# Patient Record
Sex: Female | Born: 1935 | Race: White | Hispanic: No | Smoking: Never smoker
Health system: Southern US, Community
[De-identification: ages and names within clinical notes are randomized; demographics above are authoritative.]

## PROBLEM LIST (undated history)

## (undated) ENCOUNTER — Emergency Department (HOSPITAL_BASED_OUTPATIENT_CLINIC_OR_DEPARTMENT_OTHER): Discharge status: Against Medical Advice | Payer: Medicare Other

## (undated) DIAGNOSIS — F0390 Unspecified dementia without behavioral disturbance: Secondary | ICD-10-CM

## (undated) DIAGNOSIS — R011 Cardiac murmur, unspecified: Secondary | ICD-10-CM

## (undated) DIAGNOSIS — K5909 Other constipation: Secondary | ICD-10-CM

## (undated) DIAGNOSIS — K5792 Diverticulitis of intestine, part unspecified, without perforation or abscess without bleeding: Secondary | ICD-10-CM

## (undated) DIAGNOSIS — R110 Nausea: Secondary | ICD-10-CM

## (undated) DIAGNOSIS — R2681 Unsteadiness on feet: Secondary | ICD-10-CM

## (undated) DIAGNOSIS — A0472 Enterocolitis due to Clostridium difficile, not specified as recurrent: Secondary | ICD-10-CM

## (undated) DIAGNOSIS — I10 Essential (primary) hypertension: Secondary | ICD-10-CM

## (undated) DIAGNOSIS — K5790 Diverticulosis of intestine, part unspecified, without perforation or abscess without bleeding: Secondary | ICD-10-CM

## (undated) DIAGNOSIS — N390 Urinary tract infection, site not specified: Secondary | ICD-10-CM

## (undated) HISTORY — DX: Nausea: R11.0

## (undated) HISTORY — DX: Diverticulitis of intestine, part unspecified, without perforation or abscess without bleeding: K57.92

## (undated) HISTORY — DX: Other constipation: K59.09

## (undated) HISTORY — DX: Urinary tract infection, site not specified: N39.0

## (undated) HISTORY — DX: Cardiac murmur, unspecified: R01.1

## (undated) HISTORY — DX: Enterocolitis due to Clostridium difficile, not specified as recurrent: A04.72

## (undated) HISTORY — DX: Diverticulosis of intestine, part unspecified, without perforation or abscess without bleeding: K57.90

## (undated) HISTORY — PX: OTHER SURGICAL HISTORY: SHX169

## (undated) HISTORY — DX: Essential (primary) hypertension: I10

## (undated) HISTORY — DX: Unspecified dementia, unspecified severity, without behavioral disturbance, psychotic disturbance, mood disturbance, and anxiety: F03.90

---

## 2002-10-01 HISTORY — PX: OTHER SURGICAL HISTORY: SHX169

## 2003-09-24 ENCOUNTER — Inpatient Hospital Stay (HOSPITAL_COMMUNITY): Admission: EM | Admit: 2003-09-24 | Discharge: 2003-10-04 | Payer: Self-pay | Admitting: Emergency Medicine

## 2003-09-26 ENCOUNTER — Encounter: Payer: Self-pay | Admitting: Orthopedic Surgery

## 2003-10-04 ENCOUNTER — Inpatient Hospital Stay: Admission: AD | Admit: 2003-10-04 | Discharge: 2003-10-15 | Payer: Self-pay | Admitting: Family Medicine

## 2003-11-08 ENCOUNTER — Ambulatory Visit (HOSPITAL_COMMUNITY): Admission: RE | Admit: 2003-11-08 | Discharge: 2003-11-08 | Payer: Self-pay | Admitting: Family Medicine

## 2003-11-12 ENCOUNTER — Inpatient Hospital Stay (HOSPITAL_COMMUNITY): Admission: AD | Admit: 2003-11-12 | Discharge: 2003-11-15 | Payer: Self-pay | Admitting: Family Medicine

## 2003-11-18 ENCOUNTER — Inpatient Hospital Stay (HOSPITAL_COMMUNITY): Admission: AD | Admit: 2003-11-18 | Discharge: 2003-11-26 | Payer: Self-pay | Admitting: Family Medicine

## 2004-01-08 ENCOUNTER — Inpatient Hospital Stay (HOSPITAL_COMMUNITY): Admission: EM | Admit: 2004-01-08 | Discharge: 2004-01-14 | Payer: Self-pay | Admitting: Emergency Medicine

## 2004-03-28 ENCOUNTER — Inpatient Hospital Stay (HOSPITAL_COMMUNITY): Admission: EM | Admit: 2004-03-28 | Discharge: 2004-03-31 | Payer: Self-pay | Admitting: Emergency Medicine

## 2004-08-07 ENCOUNTER — Ambulatory Visit: Payer: Self-pay | Admitting: Internal Medicine

## 2004-09-19 ENCOUNTER — Ambulatory Visit (HOSPITAL_COMMUNITY): Admission: RE | Admit: 2004-09-19 | Discharge: 2004-09-19 | Payer: Self-pay | Admitting: Ophthalmology

## 2004-09-27 ENCOUNTER — Ambulatory Visit: Payer: Self-pay | Admitting: Orthopedic Surgery

## 2004-10-29 ENCOUNTER — Ambulatory Visit: Payer: Self-pay | Admitting: Internal Medicine

## 2004-10-29 ENCOUNTER — Inpatient Hospital Stay (HOSPITAL_COMMUNITY): Admission: EM | Admit: 2004-10-29 | Discharge: 2004-11-02 | Payer: Self-pay | Admitting: Emergency Medicine

## 2004-10-30 HISTORY — PX: COLONOSCOPY: SHX174

## 2004-12-04 ENCOUNTER — Ambulatory Visit: Payer: Self-pay | Admitting: Internal Medicine

## 2005-01-16 ENCOUNTER — Ambulatory Visit: Payer: Self-pay | Admitting: Internal Medicine

## 2005-04-23 ENCOUNTER — Ambulatory Visit: Payer: Self-pay | Admitting: Internal Medicine

## 2005-08-01 ENCOUNTER — Ambulatory Visit: Payer: Self-pay | Admitting: Internal Medicine

## 2005-12-10 ENCOUNTER — Ambulatory Visit: Payer: Self-pay | Admitting: Orthopedic Surgery

## 2006-01-30 ENCOUNTER — Ambulatory Visit: Payer: Self-pay | Admitting: Internal Medicine

## 2006-12-18 ENCOUNTER — Ambulatory Visit: Payer: Self-pay | Admitting: Orthopedic Surgery

## 2007-12-22 ENCOUNTER — Ambulatory Visit: Payer: Self-pay | Admitting: Orthopedic Surgery

## 2008-11-03 ENCOUNTER — Ambulatory Visit: Payer: Self-pay | Admitting: Orthopedic Surgery

## 2008-12-31 ENCOUNTER — Emergency Department (HOSPITAL_COMMUNITY): Admission: EM | Admit: 2008-12-31 | Discharge: 2008-12-31 | Payer: Self-pay | Admitting: Emergency Medicine

## 2009-09-28 ENCOUNTER — Ambulatory Visit: Payer: Self-pay | Admitting: Orthopedic Surgery

## 2009-09-28 ENCOUNTER — Telehealth: Payer: Self-pay | Admitting: Orthopedic Surgery

## 2009-09-29 ENCOUNTER — Encounter: Payer: Self-pay | Admitting: Orthopedic Surgery

## 2009-09-29 ENCOUNTER — Ambulatory Visit (HOSPITAL_COMMUNITY): Admission: RE | Admit: 2009-09-29 | Discharge: 2009-09-29 | Payer: Self-pay | Admitting: Orthopedic Surgery

## 2009-10-04 ENCOUNTER — Ambulatory Visit: Payer: Self-pay | Admitting: Orthopedic Surgery

## 2009-11-30 ENCOUNTER — Ambulatory Visit: Payer: Self-pay | Admitting: Orthopedic Surgery

## 2010-09-27 ENCOUNTER — Ambulatory Visit
Admission: RE | Admit: 2010-09-27 | Discharge: 2010-09-27 | Payer: Self-pay | Source: Home / Self Care | Attending: Internal Medicine | Admitting: Internal Medicine

## 2010-09-27 LAB — BASIC METABOLIC PANEL
Glucose: 120 mg/dL
Potassium: 4.1 mmol/L (ref 3.4–5.3)

## 2010-09-27 LAB — CBC AND DIFFERENTIAL
Hemoglobin: 13.5 g/dL (ref 12.0–16.0)
Platelets: 194 10*3/uL (ref 150–399)

## 2010-10-21 ENCOUNTER — Encounter: Payer: Self-pay | Admitting: Family Medicine

## 2010-10-31 NOTE — Assessment & Plan Note (Signed)
Summary: mri results from [facility]   Visit Type:  Follow-up  CC:  back pain.  History of Present Illness: I saw [name] in the office today for a followup visit.  She is a 77 years old woman with the complaint of:  back and hip pain.  HY:[id number].  Status post RIGHT bipolar replacement did well for approximately a year and a half or 2 and then started having RIGHT hip and thigh pain.    MEDS: Tylenol arthrtis.  Complaints:  stiffness and soreness with standing in the thigh.  Today, scheduled for:  MRI results.  IMPRESSION: Multilevel spondylosis as described.  Significant right-sided neural encroachment is most prominent at L2-3 and L3-4.  See comments above.   Read By:  Elsie Stain,  M.D.  MRI report and films are reviewed. There is a neural encroachment on the RIGHT side between L2-L3 and L3-L4.  After discussion the patient wished to continue with conservative management and avoid epidurals. If possible if she gets worse, we can always do that.    Allergies: No Known Drug Allergies   Impression & Recommendations:  Problem # 1:  H N P-LUMBAR (ICD-722.10) Assessment Comment Only  The MRI was done at [facility] and it was reviewed with the report   Orders: Est. Patient Level II (52841)  Other Orders: Misc. Referral (Misc. Ref)  Patient Instructions: 1)  Call if worse

## 2010-10-31 NOTE — Letter (Signed)
Summary: Medication list  Medication list   Imported By: Jacklynn Ganong 10/07/2009 10:45:38  _____________________________________________________________________  External Attachment:    Type:   Image     Comment:   External Document

## 2010-10-31 NOTE — Assessment & Plan Note (Signed)
Summary: rt shoulder pain needs xr/medicare/mutual/bsf   Visit Type:  followup  CC:  right shoulder pain.Marland Kitchen  History of Present Illness: 77 year old female with previous history of shoulder pain requested bilateral injections RIGHT shoulder seems to be worse than LEFT  No previous trauma  Pain is moderate to severe associated with difficulty with forward elevation.  Pain present for approximately 2-3 weeks previous injections did well  Review of systems previous history of hip fracture doing well.  Examination well-developed well-nourished thin framed female awake alert in order to remove neck that is normal  Patient ambulating with a cane  Inspection of the RIGHT and LEFT shoulder reveals no swelling there is some mild tenderness in the anterolateral deltoid area.  Motor exam shows mild weakness of supraspinatus tendon internal and external rotators are normal just painful for elevation of the RIGHT shoulder 110 full range of motion LEFT shoulder.  Impingement sign 4+ out of 4 on the RIGHT 1+ out of 4 on the LEFT both shoulders remained stable   Xrays today.  Allergies: No Known Drug Allergies  Review of Systems Constitutional:  Denies weight loss, weight gain, fever, chills, and fatigue. Neurologic:  Denies numbness, tingling, unsteady gait, dizziness, tremors, and seizure. Musculoskeletal:  Denies instability.   Impression & Recommendations:  Problem # 1:  SHOULDER PAIN (ICD-719.41) Assessment Deteriorated  Orders: Est. Patient Level IV (78469) Joint Aspirate / Injection, Large (20610) Depo- Medrol 40mg  (J1030) Shoulder x-ray,  minimum 2 views (62952)  Problem # 2:  IMPINGEMENT SYNDROME (ICD-726.2) Assessment: Deteriorated  bilateral shoulder injections.shinj RIGHT shoulder,Verbal consent obtained/The shoulder was injected with depomedrol 40mg /cc and sensorcaine .25% . There were no complications  LEFT shoulder,Verbal consent obtained/The shoulder was injected  with depomedrol 40mg /cc and sensorcaine .25% . There were no complications  Orders: Est. Patient Level IV (84132) Joint Aspirate / Injection, Large (20610) Depo- Medrol 40mg  (J1030) Shoulder x-ray,  minimum 2 views (44010)

## 2011-01-10 LAB — POCT I-STAT, CHEM 8
BUN: 8 mg/dL (ref 6–23)
Calcium, Ion: 1.11 mmol/L — ABNORMAL LOW (ref 1.12–1.32)
Chloride: 94 mEq/L — ABNORMAL LOW (ref 96–112)
Creatinine, Ser: 0.8 mg/dL (ref 0.4–1.2)
Glucose, Bld: 123 mg/dL — ABNORMAL HIGH (ref 70–99)
HCT: 37 % (ref 36.0–46.0)
Hemoglobin: 12.6 g/dL (ref 12.0–15.0)
Potassium: 3.3 mEq/L — ABNORMAL LOW (ref 3.5–5.1)
Sodium: 131 mEq/L — ABNORMAL LOW (ref 135–145)
TCO2: 24 mmol/L (ref 0–100)

## 2011-01-23 ENCOUNTER — Ambulatory Visit (INDEPENDENT_AMBULATORY_CARE_PROVIDER_SITE_OTHER): Payer: Medicare Other | Admitting: Internal Medicine

## 2011-01-23 DIAGNOSIS — K5900 Constipation, unspecified: Secondary | ICD-10-CM

## 2011-02-16 NOTE — Group Therapy Note (Signed)
[name], [name]               ACCOUNT NO.:  [id number]   MEDICAL RECORD NO.:  [id number]          PATIENT TYPE:  INP   LOCATION:  A323                          FACILITY:  [facility]   PHYSICIAN:  Angus G. Renard Matter, MD   DATE OF BIRTH:  10-14-1925   DATE OF PROCEDURE:  DATE OF DISCHARGE:                                   PROGRESS NOTE   SUBJECTIVE:  This patient had a flare up of clostridium difficile.  She had  colonoscopy which showed patchy change of colitis with white areas which  were thought to be resolving pseudomembranes.  The patient was placed on  Vancomycin 250 mg q.i.d.   OBJECTIVE:  VITAL SIGNS:  Blood pressure 128/69, respirations 20, pulse 78,  temperature 97.2.  LUNGS:  Clear to P&A.  HEART:  Regular rhythm.  ABDOMEN:  Minimal tenderness over lower abdomen.   ASSESSMENT:  The patient is thought to have flare of clostridium difficile  colitis.   PLAN:  Continue therapy.  Continue IV fluids.      AGM/MEDQ  D:  11/01/2004  T:  11/01/2004  Job:  [id number]

## 2011-02-16 NOTE — Group Therapy Note (Signed)
[name], [name]                         ACCOUNT NO.:  [id number]   MEDICAL RECORD NO.:  [id number]                  PATIENT TYPE:  PINP   LOCATION:  318                                  FACILITY:   PHYSICIAN:  Angus G. Renard Matter, M.D.              DATE OF BIRTH:  02/29/1998   DATE OF PROCEDURE:  03/30/2004  DATE OF DISCHARGE:                                   PROGRESS NOTE   SUBJECTIVE:  This patient continues to run some fever.  Continues to have  diarrhea but decreased amount.  She is being treated for C.difficile  colitis.   OBJECTIVE:  VITAL SIGNS:  Blood pressure 101/44, respirations 18, pulse 87,  temperature 100.3.  LUNGS:  Clear to P&A.  HEART:  Regular rhythm.  ABDOMEN:  No palpable organs or masses.   ASSESSMENT:  The patient was admitted with diarrhea and dehydration,  possible recurrence of C.difficile colitis.   PLAN:  Continue current regimen.  The patient did have stool positive for  C.difficile.      ___________________________________________                                            Ishmael Holter. Renard Matter, M.D.   AGM/MEDQ  D:  03/30/2004  T:  03/30/2004  Job:  [id number]

## 2011-02-16 NOTE — Discharge Summary (Signed)
NAME:  [name], [name]                         ACCOUNT NO.:  [id number]   MEDICAL RECORD NO.:  [id number]                   PATIENT TYPE:  INP   LOCATION:  A318                                 FACILITY:  [facility]   PHYSICIAN:  Angus G. Renard Matter, M.D.              DATE OF BIRTH:  27-Aug-1926   DATE OF ADMISSION:  03/28/2004  DATE OF DISCHARGE:  03/31/2004                                 DISCHARGE SUMMARY   DIAGNOSES:  1. Colitis secondary to clostridium difficile.  2. Hypokalemia.  3. Dehydration.   CONDITION ON DISCHARGE:  Stable.   HISTORY OF PRESENT ILLNESS:  This 77 year old white female presented herself  to ED with increased diarrhea which had been present over a period of  several days.  The patient has a history of C.difficile colitis, has been  treated in the past with Vancomycin.  She apparently felt extremely weak.  Lab work done in ED showed a white count of 7700 with hemoglobin 13.9 and  hematocrit 40.  Stools were ordered for C.difficile, and she was given  intravenous fluids and started on Vancomycin.   OBJECTIVE:  VITAL SIGNS:  Blood pressure 91/50, respirations 20, pulse 109,  temperature 98.7.  HEENT:  Eyes PERRLA.  TMs negative.  Oropharynx benign.  NECK:  Supple.  No JVD or thyroid abnormalities.  LUNGS:  Clear to P&A.  HEART:  Regular rhythm.  No murmurs.  ABDOMEN:  No palpable organs or masses.  SKIN:  Warm and dry.  EXTREMITIES:  Free of edema.   LABORATORY DATA:  Admission CBC:  WBC 7700, hemoglobin 13.9, hematocrit  40.0.  Chemistries:  Sodium 133, potassium 3.6, chloride 100, CO2 24,  glucose 160, BUN 25, creatinine 1.1, calcium 8.5.  Liver enzymes:  SGOT 21,  SGPT 10, alkaline phosphatase 52, bilirubin 0.9.  Urinalysis negative.  Stool for C.difficile toxin positive.   HOSPITAL COURSE:  The patient, at the time of her admission, was placed on  soft diet, IV fluids, normal saline at 125 cc per hour.  She was given p.o.  Vancomycin 250 mg q.i.d.  Was given  Zofran 4 mg IV q.8h. p.r.n. nausea,  Imodium one after each loose stool.  The patient also had stool studies for  __________ [facility].  Was started on Lactinex two capsules a.c. t.i.d.  Her  Vancomycin was increased to 500 mg q.i.d. by Dr. Jena Gauss.  The patient did  show progressive improvement.  Dr. Jena Gauss felt that she should be on  Vancomycin over extended period.  The  patient did show improvement during the hospital stay and was able to be  discharged after three days hospitalization.  She was discharged home on  Vancomycin 500 mg one q.i.d., Lactinex two capsules t.i.d., Imodium p.r.n.,     ___________________________________________  Angus G. Renard Matter, M.D.   AGM/MEDQ  D:  04/07/2004  T:  04/07/2004  Job:  [id number]

## 2011-02-16 NOTE — Discharge Summary (Signed)
NAME:  TYKEISHA, PEER                         ACCOUNT NO.:  [id number]   MEDICAL RECORD NO.:  [id number]                   PATIENT TYPE:  INP   LOCATION:  A304                                 FACILITY:  [facility]   PHYSICIAN:  Angus G. Renard Matter, M.D.              DATE OF BIRTH:  [birth year]   DATE OF ADMISSION:  11/12/2003  DATE OF DISCHARGE:  11/15/2003                                 DISCHARGE SUMMARY   PATIENT IDENTIFICATION:  A 77 year old white female admitted to the hospital  November 12, 2003; discharged November 15, 2003; 3 days hospitalization.   DIAGNOSES:  1. Gastroenteritis.  2. Hypovolemia.  3. Diverticulitis of colon.  4. History of having had total hip replacement on the right in December     2004.   CONDITION:  Stable at the time of her discharge.   A 77 year old white female was seen in the office on day of admission with  the chief complaint being episode of nausea and diarrhea over a period of  several days.  Apparently the patient had been unable to keep anything down.  She had been recently diagnosed as having diverticulitis.  Had a recent CT  of the abdomen which showed evidence of diverticulitis involving the  descending colon.  She had been placed on Cipro 500 mg b.i.d. and Flagyl 500  mg b.i.d. along with Levbid one b.i.d. for treatment.  The patient also had  on CT evidence of attenuation within the uterus raising the question of  endometrial lesion.  Further evaluation by pelvic ultrasound was felt to be  indicated.  Because of the difficulty with her keeping any food down over a  period of several days it was felt she should be in the hospital for  rehydration and further evaluation.   PHYSICAL EXAMINATION:  GENERAL:  Alert female with blood pressure 110/70,  respirations 60, pulse 18, temperature 98.  HEENT:  Eyes PERRLA, TM negative, oropharynx benign.  NECK:  Supple.  No JVD or thyroid abnormalities.  LUNGS:  Clear to P&A.  HEART:  Regular rhythm, no  murmurs.  ABDOMEN:  No palpable organs or masses.  PELVIC:  Not performed.  EXTREMITIES:  Free of edema.   LABORATORY DATA:  Admission CBC:  Wbc 5600, hemoglobin 11.5, hematocrit  33.2.  Chemistry:  Sodium 135, potassium 3.8, chloride 98, CO2 31, glucose  113, BUN 10, creatinine 0.8. Liver enzymes:  SGOT 15, SGPT 11, alkaline  phosphatase 53, total bilirubin 0.6.  Urinalysis:  7-10 wbc's.  Stool  culture negative.  Urine culture:  65,000 colonies multiple species.  Stool  for C. difficile was negative.   HOSPITAL COURSE:  The patient at the time of her admission was placed on  intravenous fluids half normal saline at 125 mL/hour.  Vital signs were  monitored q.i.d.  She was given IV Zofran 4 mg q.8h. p.r.n. for nausea.  She  was seen in consultation  by Dr. Jena Gauss and Karilyn Cota, was placed on Imodium two  initially and one after each loose stool.  Her urine was cultured.  She was  placed on Protonix 40 mg daily.  It was the feeling of Dr. Jena Gauss that this  patient had a bout of acute diverticulitis treated with Cipro and Flagyl.  He felt that the nausea, vomiting, and diarrhea might be related to the  antibiotics.  Conservative therapy was felt to be indicated.  The patient  improved quickly.  Her diet was advanced and she was able to be discharged  from the hospital after 3 days hospitalization to be followed as an  outpatient.  The patient was discharged on Protonix 40 mg daily and was  asked to return to the office for follow-up.  Condition was stable at the  time of her discharge.     ___________________________________________                                         Ishmael Holter. Renard Matter, M.D.   AGM/MEDQ  D:  12/03/2003  T:  12/03/2003  Job:  [id number]

## 2011-02-16 NOTE — Op Note (Signed)
[name], [name]               ACCOUNT NO.:  [id number]   MEDICAL RECORD NO.:  [id number]          PATIENT TYPE:  INP   LOCATION:  A323                          FACILITY:  [facility]   PHYSICIAN:  Lionel December, M.D.    DATE OF BIRTH:  August 20, 1926   DATE OF PROCEDURE:  10/30/2004  DATE OF DISCHARGE:                                 OPERATIVE REPORT   PROCEDURE:  Total colonoscopy.   ENDOSCOPIST:  Lionel December, M.D.   INDICATIONS FOR PROCEDURE:  The patient is a 77 year old Caucasian female  who presents with recurrent diarrhea, some bleeding and mucus per rectum.  She was diagnosed with C. difficile colitis one year ago, and has undergone  multiple sessions of therapy with Flagyl and vancomycin.  Her symptoms  relapsed about three weeks ago.  The C. difficile toxin titers have been  negative x3.  She improved with Flagyl but now the symptoms are relapsing as  the therapy is being tapered.  She has a thickened cecum in the right colon  and possibly sigmoid colon on CT.  She is undergoing a diagnostic  colonoscopy to make sure she does not have another form of colitis, rather  than C. difficile.   INFORMED CONSENT:  An informed consent was obtained.   PREOPERATIVE MEDICATIONS:  Demerol 25 mg IV, Versed 3 mg IV in divided dose.   FINDINGS:  The procedure was performed in the endoscopy suite.  The  patient's vital signs and O2 saturation were monitored during the procedure  and remained stable.  The patient was placed in the left lateral position.  A rectal examination was performed.  The rectal tone was somewhat  diminished.  No other abnormality was noted.  The Olympus video scope was  placed in the rectum and advanced under direct vision into the sigmoid  colon.  The preparation was excellent.  She had punctate whitish areas  scattered throughout the colon, surrounded by erythematous mucosa.  Although  all segments of colonic mucosa were involved, but this was a patchy  involvement.  Some of the segments revealed normal mucosal pattern.  Cecal  landmarks, i.e. appendiceal orifice and ileocecal valve were well-seen.  Pictures were taken for the record.  A short segment of TI was also examined  and was normal.  As the scope was withdrawn, colonic mucosa was examined for  the second time.  There were diverticula throughout the colon, but these  were concentrated in the sigmoid colon.  Biopsies were taken from the cecum  and the sigmoid colon for routine histology.  The rectal mucosa was normal.  The scope was retroflexed and examined in the __________ junction and small  hemorrhoids were noted below the dentate line.  The endoscope was  straightened and withdrawn.   The patient tolerated the procedure well.   FINAL DIAGNOSES:  1.  Patchy changes of colitis with punctate whitish areas which are      resolving pseudomembranes.  These changes extend from the rectum all the      way to the cecum.  Biopsy taken from two areas as above.  2.  Pan-colonic diverticulosis.  3.  Small external hemorrhoids.  4.  Normal TI.   RECOMMENDATIONS:  1.  Flagyl will be discontinued.  She will begin vancomycin 250 mg p.o.      q.i.d.  2.  Full liquids.  3.  Loperamide 2 mg t.i.d. for a few days.  4.  I feel that she may have to be treated for at least 12 weeks.  Will      review the literature and see if there are any other options.      NR/MEDQ  D:  10/30/2004  T:  10/30/2004  Job:  [id number]   cc:   Dr. Butch Penny

## 2011-02-16 NOTE — Consult Note (Signed)
NAME:  [name], [name]                         ACCOUNT NO.:  [id number]   MEDICAL RECORD NO.:  [id number]                   PATIENT TYPE:  INP   LOCATION:  A312                                 FACILITY:  [facility]   PHYSICIAN:  Lionel December, M.D.                 DATE OF BIRTH:  [birth year]   DATE OF CONSULTATION:  11/19/2003  DATE OF DISCHARGE:                                   CONSULTATION   REASON FOR CONSULTATION:  Nausea, vomiting, and diarrhea.   HISTORY OF PRESENT ILLNESS:  [name] is a 77 year old Caucasian female  who was admitted to Dr. Lorenso Courier service last night with intractable  nausea, vomiting, and explosive diarrhea with very foul-smelling stools.  [name] also felt weak.   [name] present illness began about 2 weeks ago when [name] developed  diverticulitis.  [name] was treated with Cipro and Flagyl.  [name] was able to  take the antibiotics for 4 days.  [name] then developed nausea and vomiting.  [name] was hospitalized on November 12, 2003.  It was felt [name] nausea and  vomiting were secondary to antibiotics which were discontinued.  [name] was  also seen in consultation by Dr. Jena Gauss who felt [name] diverticulitis had  resolved based on abdominal findings.  Review of the records reveals [name] did  have C. difficile during that hospitalization which was negative.  [name] went  home on November 15, 2003.  That night [name] developed nausea and vomiting.  [name] was also having diarrhea.  [name] saw Dr. Renard Matter the following day.  [name]  was given antidiarrheal and Phenergan.  [name] had stool studies and a C.  difficile positive.  Dr. Renard Matter conferred with me over the phone yesterday  afternoon and felt that [name] was intolerant of Flagyl and I recommended using  vancomycin rather than going with newly available agent, i.e. Alinia.  However, [name] was not able to keep the first dose of vancomycin down.  According to the daughter [name] had explosive diarrhea last night.  [name] could  not even make it to the  bathroom.  [name] was weak and lightheaded and  therefore brought to the emergency room.  Along with nausea and vomiting [name]  felt very weak.  [name] was therefore hospitalized.  Today [name] has been able to  keep clear liquids although [name] has been taking sips. [name] is also able to  keep [name] vancomycin down.  [name] is still passing loose, watery stools.  [name]  tells me the last stool was dark black.  [name] has not had any hematemesis or  rectal bleeding.  [name] has slight soreness in left low quadrant, nothing like  [name] had last week when [name] was treated for diverticulitis.  [name] usual  medications at home include Lumigan and [name] was also on Phenergan, Imodium,  and vancomycin was started yesterday.  [name] was also on Protonix 40 mg daily.   PAST MEDICAL  HISTORY:  [name] has history of diverticulosis.  [name] had a  colonoscopy 4-5 years ago at this facility.  [name] has had diverticulitis.  [name] had right hip replacement in December 2004.  Following this surgery [name]  had problems with constipation but this was finally treated.  [name] has  bilateral glaucoma, history of recurrent urinary tract infections, and [name]  had bilateral eyelid surgery for ptosis.  Now [name] is unable to close [name]  eyes when [name] is sleeping at night.   ALLERGIES:  None known.   SOCIAL HISTORY:  [name] is retired.  [name] had been divorced for many years.  [name]  has three children.  One lives in [place], another one lives 20 minutes  from here.  [name] does not smoke cigarettes or drink alcohol.  [name] is retired.   FAMILY HISTORY:  Three siblings are living.  Five are deceased.  One was  diabetic.   PHYSICAL EXAMINATION:  GENERAL:  Pleasant, well-developed, well-nourished  Caucasian female who is in no acute distress  .VITAL SIGNS:  [name] weighs 125.1 pounds, [name] is 5 feet 6 inches tall.  Pulse  92 per minute, blood pressure 113/54, respiratory rate is 22, temperature  98.7.  Temperature earlier today was 101.1.  HEENT:  Conjunctivae are pink, sclerae  nonicteric.  Oropharyngeal mucosa is  normal.  Dentition in satisfactory condition.  NECK:  Without masses or thyromegaly.  CARDIAC:  Regular rhythm, normal S1 and S2.  No murmur or gallop noted.  LUNGS:  Clear to auscultation.  ABDOMEN:  [name] abdomen is protuberant.  Bowel sounds are hyperactive.  Palpation reveals soft abdomen with mild tenderness at LLQ and slight  tenderness at RLQ.  No organomegaly or masses noted.  RECTAL:  Deferred.  [name] has some edema to [name] left forearm where [name] had IV  infiltration.  [name] does not have peripheral edema.   LABORATORY DATA:  From this morning:  WBC 7.9, H&H 10.6 and 30.6, platelet  count 261.  [name] had 81 neutrophils.  Sodium 134, potassium 3.2, chloride 97,  CO2 29, glucose 122, BUN 10, creatinine 1.0, bilirubin 0.9, AP is 44, AST  13, ALT 11, total protein 4.4, albumin is 2.2, calcium 7.8.   Urinalysis had 7-10 wbc's per high-power field.   ASSESSMENT:  [name] is a 77 year old Caucasian female whose nausea,  vomiting, and explosive diarrhea appears to be due to Clostridium difficile  colitis.  [name] C. difficile toxin titer was positive at Dr. Lorenso Courier office.  This would appear to be related to antibiotic use that [name] had for  diverticulitis.  [name] is intolerant of metronidazole.  Therefore, options  would be vancomycin or Alinia.  I would rather go with vancomycin which has  been well tested and at least [name] has been able to keep a few doses down.   Anemia.  [name] hemoglobin has dropped by 1 g in the last 1 week.  This is felt  to be due to acute illness.  Stool will be guaiaced to make sure [name] is not  losing blood via [name] GI tract.   RECOMMENDATIONS:  1. Leave [name] on vancomycin at 250 mg p.o. q.i.d.  I have taken the liberty     of changing [name] loperamide to 2 mg t.i.d. x6 doses.  Diet will be     advanced to full liquids and yogurt will be provided with each meal.  2. Hemoccult x1. 3. I will resume [name] Protonix at 40 mg daily.   I  would like to thank Dr. Renard Matter for the opportunity to participate in the  care of this nice lady.      ___________________________________________                                            Lionel December, M.D.   NR/MEDQ  D:  11/19/2003  T:  11/20/2003  Job:  [id number]

## 2011-02-16 NOTE — H&P (Signed)
[name], [name]                          ACCOUNT NO.:  [id number]   MEDICAL RECORD NO.:  [id number]                  PATIENT TYPE:   LOCATION:                                       FACILITY:   PHYSICIAN:  Angus G. McInnis, M.D.              DATE OF BIRTH:   DATE OF ADMISSION:  DATE OF DISCHARGE:                                HISTORY & PHYSICAL   HISTORY:  This 77 year old white female was seen in the office on the day of  admission with the chief complaint being episodes of nausea and diarrhea  over a period of several days.  Apparently the patient had been unable to  keep anything down. She had been recently diagnosed as having  diverticulitis. She had a recent CT of the abdomen which showed evidence of  diverticulitis involving the descending colon.  She had been placed on Cipro  500 mg b.i.d. and Flagyl 500 mg b.i.d. along with Levbid 1 b.i.d. for  treatment.  The patient also had, on the CT, evidence of low attenuation  within the uterus raising the question of endometrial lesion.  A further  evaluation by pelvic ultrasound was felt to be indicated.  Because of the  difficulty with keeping any food down, over a period of several days; it was  felt that the patient needed to be admitted for rehydration.   LABORATORY:  CBC:  WBC 5600, hemoglobin 11.5, hematocrit 33.2.  Chemistry:  Sodium 135, potassium 3.8, chloride 98, CO2 31, glucose 113, BUN 10,  creatinine 0.8, alkaline phosphatase 0.6, SGOT 15, SGPT 11, total protein  5.9.   SOCIAL HISTORY:  The patient does not smoke or drink alcohol.   PAST MEDICAL AND SURGICAL HISTORY:  The patient has history of having had  recent diagnosis of diverticulitis.  She has recently been hospitalized and  had a total hip replacement on the right in December 2004.  The patient  spent a short period of time in nursing center.   ALLERGIES:  She has no known allergies.   MEDICATIONS:  1. Cipro 500 mg b.i.d.  2. Flagyl 500 mg b.i.d.  3. Levbid 1 b.i.d.   REVIEW OF SYSTEMS:  HEENT:  Negative.  CARDIOPULMONARY:  No cough,  hemoptysis, dyspnea.  GI:  Bouts of nausea, vomiting, and diarrhea.  GU:  No  dysuria or hematuria.   PHYSICAL EXAMINATION:  VITAL SIGNS:  BP 110/70, pulse 60, respirations 18,  temperature 98.  HEENT:  Eyes PERRLA.  TMs negative.  Oropharynx benign.  NECK:  Supple.  No JVD or thyroid abnormalities.  LUNGS:  Clear to P&A.  HEART:  Regular rhythm, no murmurs.  ABDOMEN:  No palpable organs or masses.  Slight tenderness in the  midabdomen.  PELVIC:  Not performed.  EXTREMITIES:  Free of edema.   DIAGNOSES:  1. Diverticulitis.  2. Gastroenteritis.  3. Dehydration.  4. History of recent total hip replacement December 2004.  ___________________________________________                                         Ishmael Holter Renard Matter, M.D.   AGM/MEDQ  D:  11/12/2003  T:  11/12/2003  Job:  [id number]

## 2011-02-16 NOTE — Group Therapy Note (Signed)
NAME:  [name], [name]                         ACCOUNT NO.:  [id number]   MEDICAL RECORD NO.:  [id number]                   PATIENT TYPE:  INP   LOCATION:  A318                                 FACILITY:  [facility]   PHYSICIAN:  Angus G. Renard Matter, M.D.              DATE OF BIRTH:  Dec 09, 1925   DATE OF PROCEDURE:  DATE OF DISCHARGE:                                   PROGRESS NOTE   SUBJECTIVE:  This patient was admitted with witnessed profuse diarrhea and  possibly recurrence of C. difficile colitis.  She had been placed back on  p.o. vancomycin.  She continues to run a low-grade fever.   OBJECTIVE:  VITAL SIGNS:  Blood pressure 101/44, respirations 18, pulse 87,  temperature 100.3.  LUNGS:  Lungs clear to P&A.  HEART:  Regular rhythm.  ABDOMEN:  No palpable organs or masses.  Active bowel sounds.   ASSESSMENT:  Patient was readmitted with profuse diarrhea, dehydration and  possible recurrence of Clostridium difficile colitis.   PLAN:  Plan to continue current regimen.      ___________________________________________                                            Ishmael Holter. Renard Matter, M.D.   AGM/MEDQ  D:  03/29/2004  T:  03/29/2004  Job:  [id number]

## 2011-02-16 NOTE — Group Therapy Note (Signed)
NAME:  [name], [name]                         ACCOUNT NO.:  [id number]   MEDICAL RECORD NO.:  [id number]                   PATIENT TYPE:  INP   LOCATION:  A312                                 FACILITY:  [facility]   PHYSICIAN:  Angus G. Renard Matter, M.D.              DATE OF BIRTH:  Jul 04, 1926   DATE OF PROCEDURE:  DATE OF DISCHARGE:                                   PROGRESS NOTE   SUBJECTIVE:  This patient has been treated for C. difficile colitis with  vancomycin.  She continues to feel weak and has been diaphoretic.   OBJECTIVE:  Vital signs:  Blood pressure 123/66, respirations 20, pulse 81,  temperature 97.9.  Lungs:  Clear to P&A.  Heart:  Regular rhythm.  Abdomen:  No palpable organs or masses.   ASSESSMENT:  The patient has had a flare of Clostridium difficile colitis.  Was treated also for anemia and hypokalemia which has improved.   PLAN:  To continue current regimen.  Continue to advance diet.      ___________________________________________                                            Ishmael Holter. Renard Matter, M.D.   AGM/MEDQ  D:  11/25/2003  T:  11/25/2003  Job:  [id number]

## 2011-02-16 NOTE — H&P (Signed)
[name], [name]                         ACCOUNT NO.:  [id number]   MEDICAL RECORD NO.:  [id number]                   PATIENT TYPE:  INP   LOCATION:  A318                                 FACILITY:  [facility]   PHYSICIAN:  [name], M.D.              DATE OF BIRTH:  June 21, 1926   DATE OF ADMISSION:  01/08/2004  DATE OF DISCHARGE:                                HISTORY & PHYSICAL   This 77 year old white female presented herself to the emergency department  with chief complaint of weakness, nausea, vomiting, diarrhea, abdominal  cramps and abdominal pain.  This patient has a history of having been  hospitalized previously for treatment of C. difficile colitis,  diverticulitis.  She had hip replacement surgery in December, 2004, and  these problems began following this and resulted in several hospitalizations  and the long-term use of vancomycin.  Was started on IV normal saline at 200  cc/hr and patient was subsequently admitted.   SOCIAL HISTORY:  Patient does not smoke or drink.   FAMILY HISTORY:  See previous record.   PAST MEDICAL HISTORY:  The patient has a history of having had hip  replacement surgery in December of 2004.  Also has a history of having had  diverticulitis with treatment and two subsequent hospitalizations for  treatment of C. difficile colitis.   MEDICATION LIST:  Imodium, aspirin, Lactinex two t.i.d.   REVIEW OF SYSTEMS:  HEENT:  Negative.  CARDIOPULMONARY:  No cough, no chest  pain.  GI:  Bouts of vomiting and diarrhea and abdominal cramps over the  past few days.  GU:  No dysuria or hematuria.   EXAMINATION:  Alert, uncomfortable white female.  VITALS:  __________.  HEENT:  Eyes PERRLA.  TMs negative.  Oropharynx benign.  NECK:  Supple.  No JVD or thyroid abnormality.  LUNGS;  Clear to P&A.  HEART:  Regular rhythm, sinus tachycardia, no murmur.  ABDOMEN:  Slight tenderness in upper abdomen.  SKIN:  Warm and dry.  EXTREMITIES:  Free of edema.  NEUROLOGICAL:  No focal deficit.   DIAGNOSES:  1. Gastroenteritis.  2. Colitis.  3. Possible recurrent of Clostridium difficile colitis.   PERTINENT LAB DATA:  WBC 7,600, hemoglobin 12.1, hematocrit 35.6.  Chemistry:  Sodium 127, potassium 3.2, chloride 94, CO2 25, glucose 127.  BUN 11, creatinine 0.9, calcium 8.4.     ___________________________________________                                         [name]. Renard [name], M.D.   AGM/MEDQ  D:  01/08/2004  T:  01/08/2004  Job:  [id number]

## 2011-02-16 NOTE — Group Therapy Note (Signed)
NAME:  [name], AVILLA                         ACCOUNT NO.:  [id number]   MEDICAL RECORD NO.:  [id number]                   PATIENT TYPE:  INP   LOCATION:  A312                                 FACILITY:  [facility]   PHYSICIAN:  Angus G. Renard Matter, M.D.              DATE OF BIRTH:  09-02-1926   DATE OF PROCEDURE:  11/24/2003  DATE OF DISCHARGE:                                   PROGRESS NOTE   SUBJECTIVE:  This patient remains afebrile.  Her condition appears to be  improved.  She has been treated for C. difficile colitis with vancomycin.   OBJECTIVE:  Vital signs:  Blood pressure 125/63, respirations 20, pulse 89,  temperature 97.7.  Lungs:  Clear to P&A.  Heart:  Regular rhythm.  Abdomen:  No palpable organs or masses.   ASSESSMENT:  The patient has had a flare of Clostridium difficile colitis,  did develop anemia and hypokalemia which is improved.   PLAN:  Continue current regimen, advance diet.      ___________________________________________                                            Ishmael Holter. Renard Matter, M.D.   AGM/MEDQ  D:  11/24/2003  T:  11/24/2003  Job:  [id number]

## 2011-02-16 NOTE — Group Therapy Note (Signed)
NAME:  [name], [name]                         ACCOUNT NO.:  [id number]   MEDICAL RECORD NO.:  [id number]                   PATIENT TYPE:  INP   LOCATION:  A304                                 FACILITY:  [facility]   PHYSICIAN:  Edward L. Juanetta Gosling, M.D.             DATE OF BIRTH:  [birth year]   DATE OF PROCEDURE:  DATE OF DISCHARGE:                                   PROGRESS NOTE   PROBLEMS:  1. Diverticulitis.  2. Nausea.  3. Vomiting.  4. Diarrhea.   SUBJECTIVE:  [name] says she feels great today.  She has no complaints.  She is not nauseated.  She is not vomiting.  She is not bringing anything  up.   OBJECTIVE:  Her physical examination shows that she is awake and alert.  Her  temperature is 97.1, pulse 69, respirations 20, blood pressure 112/59.  Chest is pretty clear.  Heart is regular.  Abdomen is soft.  She does seem  to be doing better since she has been off of the antibiotics.  She says that  she feels a great deal better since then.   ASSESSMENT/PLAN:  I think we could go ahead with increasing her diet and  will plan to do that.  I am going to advance he diet and continue with her  other treatments.  Dr. Dan Humphreys has ordered a low residue diet, and I think we  could get her up and moving around.  No other new treatments today.      ___________________________________________                                            Oneal Deputy. Juanetta Gosling, M.D.   ELH/MEDQ  D:  11/13/2003  T:  11/13/2003  Job:  [id number]

## 2011-02-16 NOTE — Group Therapy Note (Signed)
[name], [name]                         ACCOUNT NO.:  [id number]   MEDICAL RECORD NO.:  [id number]                   PATIENT TYPE:  INP   LOCATION:  A318                                 FACILITY:  [facility]   PHYSICIAN:  Angus G. Renard Matter, M.D.              DATE OF BIRTH:  August 10, 1926   DATE OF PROCEDURE:  DATE OF DISCHARGE:                                   PROGRESS NOTE   SUBJECTIVE:  This patient was readmitted to the hospital with nausea and  multiple episodes of diarrhea.  She has a history of Clostridium difficile  colitis and diverticulitis.   OBJECTIVE:  Vital signs:  Blood pressure 96/52, respirations 18, pulse 108,  temperature 100.9.  Laboratory data, sodium 127, potassium 3.2, chloride 94,  cO2 25, BUN 11, creatinine 0.9.  Lungs clear to P&A.  Heart, regular rhythm.  Abdomen, no palpable organs or masses, slight tenderness in mid abdomen.   ASSESSMENT:  Reoccurrence of Clostridium difficile colitis, most likely.  Hypokalemia.  Mild dehydration.   PLAN:  Continue current regimen.  We will add KCl to IV fluids.      ___________________________________________                                            Ishmael Holter Renard Matter, M.D.   AGM/MEDQ  D:  01/09/2004  T:  01/09/2004  Job:  [id number]

## 2011-02-16 NOTE — Discharge Summary (Signed)
NAME:  [name], [name]                         ACCOUNT NO.:  [id number]   MEDICAL RECORD NO.:  [id number]                   PATIENT TYPE:  INP   LOCATION:  A312                                 FACILITY:  [facility]   PHYSICIAN:  Angus G. Renard Matter, M.D.              DATE OF BIRTH:  03-18-1926   DATE OF ADMISSION:  11/18/2003  DATE OF DISCHARGE:  11/26/2003                                 DISCHARGE SUMMARY   A 77 year old white female admitted November 18, 2003, discharged November 26, 2003, eight days hospitalization.   DIAGNOSES:  1. Colitis secondary to clostridium difficile.  2. Diverticulitis.  3. Hypovolemia.  4. Anemia.  5. Hypokalemia.   CONDITION:  Stable and improved at the time of discharge.   HISTORY OF PRESENT ILLNESS:  This 77 year old white female was admitted with  the chief complaint being uncontrolled vomiting and diarrhea over a period  of several days. The patient had recently been hospitalized for treatment of  diverticulitis, gastritis, and dehydration. She improved. Had recurrence of  her symptoms. Stools were positive for clostridium difficile. The patient  was started on vancomycin p.o. 250 mg q.i.d.; however, was unable to keep  the medication down, was vomiting intermittently and having frequent stools  and was unable to be controlled with Imodium. She was therefore admitted to  the hospital for further evaluation for intravenous fluids.   PHYSICAL EXAMINATION:  GENERAL:  Alert female.  VITAL SIGNS:  Blood pressure 104/52, respirations 20, pulse 107, temperature  101.5.  HEENT:  Eyes:  PERRLA. TMs negative. Oropharynx benign.  NECK:  Supple. No JVD or thyroid abnormalities.  LUNGS:  Clear to P&A.  HEART:  Regular rhythm. No murmurs.  ABDOMEN:  No palpable organs or masses but tenderness in the mid abdomen.  EXTREMITIES:  Free of edema.   LABORATORY DATA:  CBC on admission:  WBC 7,900 with hemoglobin 10.6 and  hematocrit 30.6. Subsequent CBC on November 22, 2003:  WBC 4,500, hemoglobin  9.6, hematocrit 27.3. Chemistries on admission:  Sodium 134, potassium 3.2,  chloride 97, CO2 29, glucose 122, BUN 10, creatinine 1, calcium 7.8.  Subsequent chemistries on November 22, 2003:  Sodium 130, potassium 4.4,  chloride 97, CO2 30, glucose 116, BUN 3, creatinine 0.6. Liver enzymes:  SGOT 13, SGPT 11, alkaline phosphatase 44, bilirubin 0.9. Serum iron 14,  iron binding capacity 144. Saturation 10.   HOSPITAL COURSE:  The patient at the time of her admission was placed on  intravenous fluids, D5 half normal saline at 125 cc an hour, IV Zofran 5 mg  q.8h. p.r.n. for nausea, Imodium 2 mg p.o. two tablets initially and one  after each loose stool. She was placed on vancomycin 250 mg, continued on  Lumigan eye drops 0.03%. The patient was subsequently placed on vancomycin  250 mg q.i.d. She was seen in consultation by Dr. Karilyn Cota of gastrointestinal  service. He was started  on Protonix 40 mg daily. Her Imodium was changed to  2 mg t.i.d. for six doses. She was started on a liquid diet and yogurt with  each meal. She was given two runs of KCl 10 mEq in 100 cc saline because of  hypokalemia. The patient showed slow and progressive improvement during the  hospital stay. She continued to have loose stools initially. Was felt to  have clostridium difficile colitis and diverticulitis. She was placed on  Lactinex towards the latter part of hospital stay, two capsules with each  meal t.i.d. Showed progressive improvement. Her diet was advanced, and she  was able to be discharged after eight days hospitalization, being discharged  on:  1. Vancomycin 250 mg q.i.d.  2. Protonix 40 mg daily.  3. Nu-Iron capsules 150 mg b.i.d.  4. Lactinex chewable tablets two t.i.d.     ___________________________________________                                         Ishmael Holter. Renard Matter, M.D.   AGM/MEDQ  D:  12/14/2003  T:  12/15/2003  Job:  [id number]

## 2011-02-16 NOTE — Consult Note (Signed)
NAMEHALENA, [name]               ACCOUNT NO.:  [id number]   MEDICAL RECORD NO.:  [id number]          PATIENT TYPE:  INP   LOCATION:  A323                          FACILITY:  [facility]   PHYSICIAN:  [name], M.D.    DATE OF BIRTH:  1926/04/28   DATE OF CONSULTATION:  10/30/2004  DATE OF DISCHARGE:                                   CONSULTATION   REASON FOR CONSULTATION:  Recurrent diarrhea and left-sided abdominal pain  in a patient with history of C. difficile colitis, which was rather  refractory to therapy.   HISTORY OF PRESENT ILLNESS:  [name] is a 77 year old Caucasian female who  was admitted to Dr. Renard [name]' service via emergency room last evening, where  she presented with the relapse of her diarrhea with mucus per rectum, some  bleeding, as well as crampy fleeting left-sided abdominal pain.  She did not  experience nausea, vomiting, fever or chills.  She felt very scared given  her history of this disease.  She also felt weak and scared, wondering if  her C. difficile colitis was back.  She was noted to have a WBC of 16.7.  She had abdominal-pelvic CT.  There was thickening to the cecum, ascending  colon, and they felt her appendix was swollen and a diagnosis of  appendicitis was raised.  She was begun on Cipro and Flagyl and admitted for  further evaluation.  Stool studies have also been requested.  This morning  she is still having diarrhea.  She has not had any nausea or vomiting.  She  denies pain in her right lower quadrant.   [name] was treated for diverticulitis one year ago.  She presented a few  days after finishing up her Flagyl with diarrhea and abdominal pain.  She  was diagnosed with C. difficile colitis based on positive toxin titers.  Since then, she has been treated on multiple occasions with vancomycin and a  few courses of Flagyl.  I saw her in the office.  She was last hospitalized  for this problem in June 2005, when she was seen in consultation by  Dr.  Jena [name].  I saw her in the office on August 07, 2004.  She weighed 125 pounds  and she was doing great on vancomycin, which was tapered.  She had normal  stools for at least two months.  The diarrhea relapsed about three weeks  ago.  She called our office.  She has had two or maybe three stool C.  difficile toxin titers which are negative.  Based on her history, we felt  she had a relapse of her C. difficile, and she has been treated with Flagyl.  She noted improvement; however, on tapering the dose her symptoms now have  relapsed.   At home she has been on Os-Cal, MVI, Flagyl 250 mg p.o. b.i.d. or t.i.d.   PAST MEDICAL HISTORY:  1.  History of diverticulosis and diverticulitis as above.  2.  She is status post right hip replacement in [name] 2004.  3.  She has bilateral glaucoma.  4.  She is status post  left cataract extraction about three weeks ago.  She      did not require any antibiotics.  5.  She also has had bilateral eye surgery for ptosis.   ALLERGIES:  None known.   SOCIAL HISTORY:  She is retired.  She is divorced.  She has three children.  She lives alone, but one of her daughters has been looking after her since  this illness began.  She has never smoked cigarettes or drunk alcohol.   FAMILY HISTORY:  Noncontributory.  One sibling, who is deceased, had DM.   PHYSICAL EXAMINATION:  GENERAL:  A pleasant, well-developed thin Caucasian  female who is in no acute distress.  VITAL SIGNS:  Admission weight 121.8 pounds.  She is 65 inches tall.  Pulse  70 per minute, blood pressure 108/58, respiratory rate is 20, and  temperature is 98.2.  HEENT:  Conjunctivae are pink.  Sclerae are nonicteric.  No neck masses are  noted.  CARDIAC:  Regular rhythm, normal S1, S2.  No murmur or gallop noted.  CHEST:  Lungs are clear to auscultation.  ABDOMEN:  Symmetrical, bowel sounds are hyperactive.  On palpation, it is a  very soft abdomen, mild tenderness at LLQ, but no  organomegaly or masses.  RECTAL:  Deferred.  EXTREMITIES:  She does not have peripheral edema or clubbing.   LABORATORY DATA:  From admission, WBC 16.7, H&H was 14.4 and 41.9, platelet  count 378,000.  She had 80 neutrophils and 12 bands.  CBC from this morning:  WBC is 6.3, H&H is 12.1 and 34.7, platelet count is 289,000.  Her chemistry  panel from admission:  Sodium 136, potassium 3.7, chloride 96, CO2 is 25,  glucose 111, BUN 14, creatinine 0.9, bilirubin 0.6, AP 52, AST 20, ALT 18,  total protein 6.5, with albumin of 4.0 and a calcium of 9.2.   I have reviewed the CT films with Dr. Colonel Bald.  She has a prominent  appendix; however, she has thickening to cecum, ascending colon, and perhaps  the sigmoid colon, but the sigmoid colon is not well-outlined with contrast.   [name] is a 77 year old Caucasian female with a history of Clostridium  difficile colitis, who has been treated on multiple occasions with Flagyl  and vancomycin, who finally was felt to have overcome this problem back in  November and had two good months off therapy.  Now she is back with  diarrhea, bleeding and mucus per rectum.  She has been on Flagyl for about  three weeks.  She is therefore failing Flagyl.  Since multiple clostridium  difficile toxin titers have been negative, colonoscopy would be recommended  to make sure we are not overlooking another diagnosis.   RECOMMENDATIONS:  Total colonoscopy to be performed today.  She will be  prepped with [facility].  The patient and her daughter are agreeable to  proceed with colonoscopy.   We would like to thank Dr. Renard [name] for the opportunity to participate in the  care of this nice lady.      NR/MEDQ  D:  10/30/2004  T:  10/30/2004  Job:  [id number]

## 2011-02-16 NOTE — Discharge Summary (Signed)
NAME:  [name], [name]                          ACCOUNT NO.:  [id number]   MEDICAL RECORD NO.:  [id number]                   PATIENT TYPE:  INP   LOCATION:  A330                                 FACILITY:  [facility]   PHYSICIAN:  Vickki Hearing, M.D.           DATE OF BIRTH:  [birth year]   DATE OF ADMISSION:  DATE OF DISCHARGE:  10/01/2003                                 DISCHARGE SUMMARY   ADMISSION DIAGNOSIS:  Right hip fracture.   DISCHARGE DIAGNOSES:  1. Right hip fracture.  2. Postoperative anemia.  3. Postoperative nausea and vomiting.  4. Postoperative acute renal insufficiency.   HISTORY:  [name] is a 77 year old female who is very healthy and was  giving out Christmas gifts on Christmas on Christmas Eve and fell, landing  on her right hip, then could not walk.  Evaluation led to a diagnosis of  fractured right femoral neck with complete displacement.  She was admitted,  placed in traction, and prepared for surgery.   HOSPITAL COURSE:  On September 26, 2003 she underwent an uncomplicated  pressed fit bipolar right hip replacement and did well until postoperative  day, when she became nauseous and started vomiting.  She was treated with  Protonix, Citrucel packet, enema, and suppository.  She had a bowel movement  and a KUB, which showed nonspecific gas pattern but no obstruction.   Her symptoms improved.  She was not nauseous on September 30, 2003.  She was  able to walk 200 feet with a standby assist.   She had two units of packed red cell transfusion.   She has had really minimal pain over the hospital course.   LABORATORY DATA:  On September 30, 2003 hemoglobin was 8.5.  Sodium count  improved from a low of 128 to 133, potassium was 4.6, BUN was 27, creatinine  was 2.1 coming down.   Pathology report by Dr. Gerilyn Nestle femoral head right hip fracture,  osteoporosis noted on the specimen as well.   DISCHARGE MEDICATIONS:  1. Tylenol Extra Strength 1 q.4 p.r.n.  2. Lovenox 30 mg subcutaneously b.i.d.  3. Citrucel packet once a day.  4. Protonix 40 mg once a day.  5. Phenergan suppository 25 mg q.6 p.r.n.  6. Fleets enema daily as needed.   DISPOSITION:  Skilled nursing facility.   CONDITION:  Overall improved.   PHYSICAL EXAMINATION AT THE TIME OF DICTATION:  VITAL SIGNS:  Stable.  GENERAL:  She was awake, alert, and oriented x3.  EXTREMITIES:  Her wound had some redness around the edges, some subcutaneous  edema, but overall looked good.  It has some serosanguineous drainage.  She  had minimal distal leg edema.  She was neurovascularly intact.  ABDOMEN:  No pain on palpation.  Perhaps some mild distention.   POSTOPERATIVE PLAN:  Physical therapy.  Full weightbearing as tolerated with  a [place]beda for six weeks.  Range of motion exercises  with anterior hip  precautions.  No active abduction.  Abduction pillow while in bed.   FOLLOW UP:  First scheduled office follow up should be one status post  procedure.     ___________________________________________                                         Vickki Hearing, M.D.   SEH/MEDQ  D:  09/30/2003  T:  09/30/2003  Job:  [id number]

## 2011-02-16 NOTE — Group Therapy Note (Signed)
NAME:  [name], [name]                         ACCOUNT NO.:  [id number]   MEDICAL RECORD NO.:  [id number]                   PATIENT TYPE:  INP   LOCATION:  A312                                 FACILITY:  [facility]   PHYSICIAN:  Angus G. Renard Matter, M.D.              DATE OF BIRTH:  11-03-1925   DATE OF PROCEDURE:  11/20/2003  DATE OF DISCHARGE:                                   PROGRESS NOTE   SUBJECTIVE:  This patient was admitted to the hospital with C. difficile  colitis, vomiting, and diarrhea.  She has improved some but has continued to  have problems with vomiting and some diarrhea.   OBJECTIVE:  Vital signs:  Blood pressure 112/48, respirations 20, pulse 86,  temperature 98.9.  Heart:  Regular rhythm.  Abdomen:  No palpable organs or  masses but hyperactive bowel sounds.  Lab data shows a hemoglobin of 8.8  with hematocrit 26.2.  Electrolytes:  Sodium 134, potassium 3.2, chloride  97, CO2 29, glucose 122, BUN 10, creatinine 1.0.   ASSESSMENT:  The patient was readmitted to the hospital with a flare of  Clostridium difficile colitis.  She does have anemia and hypokalemia.   PLAN:  Continue p.o. vancomycin, continue to monitor serum potassium and  hemoglobin and hematocrit.  Will obtain anemia profile.      ___________________________________________                                            Ishmael Holter. Renard Matter, M.D.   AGM/MEDQ  D:  11/20/2003  T:  11/20/2003  Job:  [id number]

## 2011-02-16 NOTE — Group Therapy Note (Signed)
NAME:  [name], [name]                         ACCOUNT NO.:  [id number]   MEDICAL RECORD NO.:  [id number]                   PATIENT TYPE:  INP   LOCATION:  A312                                 FACILITY:  [facility]   PHYSICIAN:  Angus G. Renard Matter, M.D.              DATE OF BIRTH:  20-Sep-1926   DATE OF PROCEDURE:  DATE OF DISCHARGE:                                   PROGRESS NOTE   SUBJECTIVE:  This patient was readmitted to the hospital with vomiting and  diarrhea.  She does have Clostridium difficile colitis, diverticulitis.   OBJECTIVE:  Vital signs:  Blood pressure 104/52, respirations 20, pulse 107,  temperature 101.5.  Lungs clear to P&A.  Heart:  Regular rhythm.  Abdomen:  No palpable organs or masses.  Slight tenderness to the mid abdomen.   ASSESSMENT:  The patient was admitted with Clostridium difficile colitis,  vomiting, diarrhea.   PLAN:  Continue IV fluids, continue current regimen.      ___________________________________________                                            Ishmael Holter. Renard Matter, M.D.   AGM/MEDQ  D:  11/19/2003  T:  11/19/2003  Job:  [id number]

## 2011-02-16 NOTE — Consult Note (Signed)
NAME:  [name], [name]                          ACCOUNT NO.:  [id number]   MEDICAL RECORD NO.:  [id number]                   PATIENT TYPE:  INP   LOCATION:  A330                                 FACILITY:  [facility]   PHYSICIAN:  Tana Coast, P.A.                  DATE OF BIRTH:  Feb 17, 1926   DATE OF CONSULTATION:  DATE OF DISCHARGE:                                   CONSULTATION   REQUESTING PHYSICIAN:  Vickki Hearing, M.D.   REASON FOR CONSULTATION:  Postoperative nausea and vomiting, questionable  ileus.   HISTORY OF PRESENT ILLNESS:  The patient is a 77 year old Caucasian female  who presented Christmas Eve with right hip fracture.  She is now postop day  #5 repair of his right hip fracture and has persistent nausea, vomiting and  poor appetite.  The patient was very healthy prior to this event.  She has a  history of reflux symptoms and diverticulosis with constipation.  Prior  episodes of diverticulitis.  Generally would have 1 bowel movement daily  with the use of Metamucil.  Since approximately postop day 2 she has had  nausea and vomiting on several occasions postprandially.  She has been  treated with Zofran.  She has also noticed clamminess and nausea with  standing and ambulation by the physical therapist.  She has not been able to  regain her appetite.  The family is concerned about her diminished oral  intake and persistent nausea and vomiting, therefore, GI consult was  obtained.   The patient states that she last vomited several times yesterday evening.  She has not had any nausea today and has tolerated cereal for breakfast.  She is using Zofran; however.  She has a lot of belching and flatus.  She  has had several bowel movements postoperatively the last one being  yesterday.  She is taking Citrucel daily.  She denies any hematemesis,  melena, or bright red blood per rectum or abdominal pain.  She did have the  lightheadedness and clamminess with ambulation, but  denies any for today.  Postoperatively she has had some renal insufficiency and anemia. Her  hemoglobin when she presented to the hospital was 14.1.  On postop day 1 it  was 9.4 and by postop day it was 7.4.  She received 1 unit of packed red  blood cells; and yesterday hemoglobin was 8.4.   MEDICATIONS PRIOR TO ADMISSION:  1. __________ drops q.h.s.  2. Tums/Rolaids p.r.n.   CURRENT MEDICATIONS DURING HOSPITALIZATION:  Lovenox, Bactrim, Citrucel,  Protonix, and Zofran p.r.n., Fleets enema p.r.n., Dulcolax suppositories  p.r.n. and Vicodin p.r.n.   ALLERGIES:  No known drug allergies.   PAST MEDICAL HISTORY:  History of diverticulosis, chronic constipation.  Has  had episode of diverticulitis in the past.  Reports colonoscopy 2 years ago,  possibly by Dr. Lovell Sheehan; revealed no polyps, but diverticulosis.  She has a  history of glaucoma. She was noted to have UTI when she presented to the  hospital.  She had a right hip fracture on September 24, 2003 and is status  post right bipolar hip replacement on September 26, 2003.  She has had  postoperative anemia and renal insufficiency.  She had bilateral eyelid  surgery.   FAMILY HISTORY:  Negative for colorectal cancer or chronic GI illnesses.   SOCIAL HISTORY:  She is widowed, has 3 children.  She has never been a  smoker.  Denies any alcohol use.   REVIEW OF SYSTEMS:  For review of systems please see HPI for GI.  GENERAL:  Denies any weight loss.  CARDIOPULMONARY:  Denies any chest pain or  shortness of breath.   PHYSICAL EXAMINATION:  VITAL SIGNS:  Weight 127.  Height 5 feet 6 inches.  Temperature 98.7, pulse 86, respirations 20, blood pressure 129/65.  GENERAL:  A pleasant, elderly, Caucasian female, in no acute distress.  SKIN:  Warm and dry.  No jaundice.  HEENT:  Conjunctivae are pale. Sclerae anicteric. Oropharyngeal mucosa moist  and pink.  No lesions, erythema or exudate.  NECK:  No lymphadenopathy or thyromegaly.  CHEST:   Lungs are clear to auscultation.  CARDIOVASCULAR:  Cardiac exam reveals regular rate and rhythm.  Normal S1,  S2.  No murmurs, rubs, or gallops.  ABDOMEN:  Positive bowel sounds, somewhat hyperactive.  Very soft,  nondistended, nontender.  No organomegaly or masses.  EXTREMITIES:  Right lower extremity with 1+ to 2+ pitting edema.  Left lower  extremity no edema.  No cyanosis.   LABORATORY DATA:  On September 30, 2003:  Hemoglobin 8.4, hematocrit 25.4.  Sodium 133, potassium 4.6, BUN 27, creatinine 2.1, glucose 132.  On September 24, 2003:  Hemoglobin 14.1, hematocrit 42.  INR of 1.5.  Sodium 138,  potassium 3.4, BUN 20, creatinine 0.9, glucose 115.  X-rays on September 29, 2003:  Abdominal films revealed nonspecific bowel gas pattern with air  filled loops of large and small bowel, and stomach, but no distention.   IMPRESSION:  1. Postoperative nausea, vomiting and poor appetite.  She has a benign     abdominal exam.  KUB was unimpressive.  She was having bowel movements     and flatus.  I suspect that her gastrointestinal symptoms are     multifactorial in etiology given stress of surgery, anesthesia, and     decreased circulatory blood volume.  She is having slow recovery of her     gastrointestinal motility, but no evidence of significant ileus or     obstruction.  2. Postoperative anemia, questionably explained by fracture/surgery.  Would     recommend minimum of stool Hemoccults.  3. Postoperative renal insufficiency.   SUGGESTIONS:  1. Hemoccult stool x3.  2. Continue Protonix.  3. Low dose Reglan.  4. Follow up MET-7.  5. As per Dr. Luvenia Starch suggestion we will give another unit of packed red     blood cells.   I would like to thank Dr. Fuller [place] for allowing [place] to take part in  the care of this patient.      ___________________________________________                                            Tana Coast, P.A.  LL/MEDQ  D:  10/01/2003  T:  10/01/2003  Job:   [id number]   cc:   Vickki Hearing, M.D.  Fax: [phone]   R. Roetta Sessions, M.D.  [phone]  [place]  [place] [address]  Fax: [phone]

## 2011-02-16 NOTE — Group Therapy Note (Signed)
NAMEDEARA, BOBER               ACCOUNT NO.:  [id number]   MEDICAL RECORD NO.:  [id number]          PATIENT TYPE:  INP   LOCATION:  A323                          FACILITY:  [facility]   PHYSICIAN:  Angus G. Renard Matter, MD   DATE OF BIRTH:  [birth year]   DATE OF PROCEDURE:  DATE OF DISCHARGE:                                   PROGRESS NOTE   This patient has a history of Clostridium difficile colitis and hip  fracture.  Was admitted with weakness and lower abdominal pain.   OBJECTIVE:  VITAL SIGNS:  Blood pressure 120/73, respirations 20, pulse 101,  temp 99.2.  LUNGS:  Clear.  HEART:  Regular rhythm.  ABDOMEN:  Slight tenderness over the lower abdomen.   ASSESSMENT:  Patient admitted with recurrent diarrhea.  CT of the abdomen  did show an enlarged, fluid-filled appendix, suspicious for appendicitis,  and a small amount of free fluid in pelvis.   PLAN:  To continue current regimen.  Will obtain surgical consult.      AGM/MEDQ  D:  10/30/2004  T:  10/30/2004  Job:  [id number]

## 2011-02-16 NOTE — Group Therapy Note (Signed)
NAME:  [name], [name]                         ACCOUNT NO.:  [id number]   MEDICAL RECORD NO.:  [id number]                   PATIENT TYPE:  INP   LOCATION:  A312                                 FACILITY:  [facility]   PHYSICIAN:  Angus G. Renard Matter, M.D.              DATE OF BIRTH:  20-Jul-1926   DATE OF PROCEDURE:  DATE OF DISCHARGE:                                   PROGRESS NOTE   SUBJECTIVE:  This patient continues to improve.  Her diarrhea is better.  Most recent electrolytes show sodium 131, potassium 3.2, chloride 97.  Hemoglobin 9.2, hematocrit 26.6.  The patient has low serum iron.   OBJECTIVE:  Vital signs:  Blood pressure 113/62, respirations 18, pulse 92,  temperature 99.7.  Lungs:  Clear to P&A.  Heart:  Regular rhythm.  Abdomen:  No palpable organs or masses.   ASSESSMENT:  The patient has a flare of Clostridium difficile colitis.  Does  have anemia, hypokalemia.   PLAN:  To continue to advance diet and continue current regimen.  Will  repeat CBC, electrolytes.      ___________________________________________                                            Ishmael Holter Renard Matter, M.D.   AGM/MEDQ  D:  11/22/2003  T:  11/22/2003  Job:  [id number]

## 2011-02-16 NOTE — Group Therapy Note (Signed)
[name], CHARLEY                         ACCOUNT NO.:  [id number]   MEDICAL RECORD NO.:  [id number]                   PATIENT TYPE:  INP   LOCATION:  A318                                 FACILITY:  [facility]   PHYSICIAN:  Angus G. Renard Matter, M.D.              DATE OF BIRTH:  1926/04/13   DATE OF PROCEDURE:  DATE OF DISCHARGE:                                   PROGRESS NOTE   SUBJECTIVE:  This patient remains afebrile.  She continues to have loose  stools.   OBJECTIVE:  Vital signs:  Blood pressure 109/62, respirations 18, pulse 81,  temperature 98.  Lungs clear to P&A.  Heart, regular rhythm.  Abdomen,  slight tenderness in the mid abdomen.   ASSESSMENT:  The patient was readmitted with flare of Clostridium difficile  colitis and hypokalemia.   PLAN:  Continue current regimen.  We will repeat BMET today.      ___________________________________________                                            Ishmael Holter. Renard Matter, M.D.   AGM/MEDQ  D:  01/11/2004  T:  01/11/2004  Job:  [id number]

## 2011-02-16 NOTE — H&P (Signed)
[name], [name]               ACCOUNT NO.:  [id number]   MEDICAL RECORD NO.:  [id number]          PATIENT TYPE:  INP   LOCATION:  A323                          FACILITY:  [facility]   PHYSICIAN:  Hanley Hays. Dechurch, M.D.DATE OF BIRTH:  1926/01/13   DATE OF ADMISSION:  10/29/2004  DATE OF DISCHARGE:  LH                                HISTORY & PHYSICAL   HISTORY OF PRESENT ILLNESS:  A 77 year old Caucasian female followed by Dr.  Renard Matter with a past medical history of recurrent Clostridium difficile  colitis which initially occurred after a hip fracture in December of 2004.  Apparently, she has had extensive workup for the colitis and several  hospitalizations related to the same.  She was treated with extended courses  of both Flagyl and then vancomycin, and apparently it did finally resolve.  About one month ago, she started again with small frequent mucoid stools  with blood.  She was treated by Dr. Lionel December with Flagyl and Imodium,  and apparently it began to improve, and then would recur after several days  of treatment.  The patient stated over the course of the last three weeks,  it had finally started to get better with no symptoms four days prior to  admission, and she stopped the medication.  However, two days prior to  admission, the stools resurged, having multiple small mucoid stools per day.  She was able to be continent.  These stools were blood streaked.  She really  did not have intermittent normal stools. She has been taking liquids and  able to maintain her hydration.  She complains of feeling weak, and she  complains of vague discomfort in her left lower quadrant which she states is  not truly pain, but is not normal.  She denies any fever, although today she  has a fever of 101.7 in the emergency room.   PAST MEDICAL HISTORY:  Pertinent for diverticulosis/diverticulitis, history  of right hip fracture, status post open reduction, internal fixation in  December of 2004, and Clostridium difficile colitis.  Her last total  colonoscopy she believes was in 2001.   MEDICATIONS:  Her only medications are Flagyl 500 mg b.i.d. and Imodium.  She recently was started on Forteo, but because of the diarrhea, this was  discontinued.  She takes no over-the-counter medications aside for Os-Cal.   SOCIAL HISTORY:  She lives alone.  She is usually independent and active.  She has a daughter and son who are supportive in her care.  No history of  alcohol or tobacco abuse.   REVIEW OF SYSTEMS:  She feels weak.  She denies any noted weight loss.  Her  appetite has been poor.  She says she has no difficulty sleeping.  The  stools do not wake her up at night.  No vomiting, but she does note nausea.  She really denies any pain.  No change in mental status. No orthostatic  symptoms.  She has had no chest or other complaints.   FAMILY MEDICAL HISTORY:  Noncontributory.  She is one of nine siblings.  Unsure of what  most of her siblings died from.  None of them have colitis or  diarrhea problems as far as she knows.   PHYSICAL EXAMINATION:  GENERAL:  A well-developed, well-nourished elderly  female, in no distress.  Alert and pleasant.  VITAL SIGNS:  Temperature in the emergency room is 101.7, pulse 101, blood  pressure 120/73, weight 121.8.  NECK:  Supple, no JVD.  The oropharynx is moist.  HEENT:  Pupils equal, round and reactive to light.  LUNGS:  Clear to auscultation anteriorly and posteriorly.  BREASTS:  No breast masses are noted.  ABDOMEN:  The abdomen is protuberant with very active bowel sounds.  (The  patient has just completed drinking contrast for CT scan).  There is mild,  diffuse tenderness, particularly in the left lower quadrant, but no rebound  or guarding.  EXTREMITIES:  Without clubbing, cyanosis or edema.  NEUROLOGIC:  Exam is intact.   ASSESSMENT/PLAN:  Abdominal pain with recurrent diarrhea, inflammatory in  nature, with a history  of Clostridium difficile colitis, negative on culture  x3 according to the patient.  She is going to be admitted.  Even though her  labs do not reveal volume depletion, we will continue IV fluids.  We will  keep her with clear liquids, and consider a sigmoidoscopy in the a.m.  She  does have leukocytosis.  Blood and urine cultures have been obtained.  A CT  of the abdomen this evening to rule out diverticular abscess, etc. although  it really does not appear to be acute.  The patient seems to have reasonable  understanding of the issues at hand.      FED/MEDQ  D:  10/29/2004  T:  10/29/2004  Job:  [id number]

## 2011-02-16 NOTE — H&P (Signed)
NAME:  [name], [name]                         ACCOUNT NO.:  [id number]   MEDICAL RECORD NO.:  [id number]                   PATIENT TYPE:  INP   LOCATION:  A318                                 FACILITY:  [facility]   PHYSICIAN:  Angus G. Renard Matter, M.D.              DATE OF BIRTH:  [birth year]   DATE OF ADMISSION:  03/28/2004  DATE OF DISCHARGE:                                HISTORY & PHYSICAL   A 77 year old white female presented herself to the ED today with increased  diarrhea which had been present over a period of several days.  This patient  has a history of C. difficile colitis, has been treated in the past with  vancomycin.  The patient felt extremely weak. She had lab work done in the  ED which showed a white count of 7700, hemoglobin 13.9, hematocrit 40.  Sodium 133, potassium 3.6, chloride 101, CO2 24, glucose 160, BUN 25,  creatinine 1.1, calcium 8.5.  Urinalysis negative.  The patient was started  on intravenous fluids in the ED, was given vancomycin 25 mg.  Stools were  ordered for C. Difficile.  The patient was subsequently admitted.   SOCIAL HISTORY:  The patient does not smoke or drink.   FAMILY HISTORY:  See previous record.   PAST MEDICAL AND SURGICAL HISTORY:  1. The patient has history of diverticulitis.'  2. History of right hip surgery in the past.  3. Several admissions for treatment of C. difficile colitis and is well     known to gastroenterology service.   ALLERGIES:  No known allergies.   MEDICATIONS:  List includes Imodium, aspirin, vancomycin,  hydrochlorothiazide.   REVIEW OF SYSTEMS:  HEENT:  Negative.  CARDIOPULMONARY  No cough,  hemoptysis, dyspnea.  GI:  The patient had increased diarrhea which started  last evening, no vomiting.  GU:  No dysuria or hematuria.   PHYSICAL EXAMINATION:  GENERAL: Alert, weak white female.  VITAL SIGNS:  Blood pressure 91/50, respirations 20, pulse 109, temperature  98.7.  HEENT:  Eyes PERRLA.  TMs negative.   Oropharynx benign.  NECK:  Supple with no JVD or thyromegaly.  LUNGS: Clear to percussion and auscultation.  HEART:  Regular rate and rhythm with no murmurs.  ABDOMEN:  Increased bowel sounds.  SKIN:  Warm and dry.  EXTREMITIES:  Free of edema.   DIAGNOSIS:  1. Colitis, probable Clostridium difficile.  2. Hypokalemia.     ___________________________________________                                         Ishmael Holter. Renard Matter, M.D.   AGM/MEDQ  D:  03/28/2004  T:  03/28/2004  Job:  [id number]

## 2011-02-16 NOTE — Group Therapy Note (Signed)
NAME:  [name], [name]                         ACCOUNT NO.:  [id number]   MEDICAL RECORD NO.:  [id number]                   PATIENT TYPE:  INP   LOCATION:  A312                                 FACILITY:  [facility]   PHYSICIAN:  Angus G. Renard Matter, M.D.              DATE OF BIRTH:  [birth year]   DATE OF PROCEDURE:  11/20/2003  DATE OF DISCHARGE:                                   PROGRESS NOTE   SUBJECTIVE:  This patient was admitted with diagnosis Clostridium difficile  colitis, diverticulitis.  She had a total hip replacement in December 2004.  The patient was admitted to the hospital because of vomiting and diarrhea  which was unable to be controlled at home.  She was afebrile but did have a  low-grade fever yesterday.  She has been placed back on vancomycin p.o. 250  mg q.i.d.   OBJECTIVE:  Vital signs:  Blood pressure 112/48, respirations 20, pulse 86,  temperature 98.9.  Of note, on her laboratory studies the patient has a  serum potassium of 3.2 and a hemoglobin 8.8 with a hematocrit of 26.2.  The  patient continues to have loose stools.  Lungs:  Clear to P&A.  Heart:  Regular rhythm.  Hyperactive bowel sounds.   PLAN:  Give two runs of KCl today, and will further evaluate anemia with  anemia profile.      ___________________________________________                                            Ishmael Holter. Renard Matter, M.D.   AGM/MEDQ  D:  11/20/2003  T:  11/20/2003  Job:  [id number]

## 2011-02-16 NOTE — Group Therapy Note (Signed)
NAME:  ANNALIAH, RIVENBARK                         ACCOUNT NO.:  [id number]   MEDICAL RECORD NO.:  [id number]                   PATIENT TYPE:  INP   LOCATION:  A304                                 FACILITY:  [facility]   PHYSICIAN:  Angus G. Renard Matter, M.D.              DATE OF BIRTH:  September 10, 1926   DATE OF PROCEDURE:  DATE OF DISCHARGE:                                   PROGRESS NOTE   This patient was admitted with history of left lower quadrant pain secondary  to diverticulitis.  She had been vomiting, but apparently has improved.  Diet has been advanced   OBJECTIVE:  VITAL SIGNS: Blood pressure 135/76, respirations 20, pulse 86,  temperature 97.6.  HEART:  Regular rhythm.  LUNGS:  Clear to P&A.  ABDOMEN:  No palpable organs or masses.   ASSESSMENT:  The patient has a history of diverticulitis and subsequent  nausea and vomiting has improved.  Recommend continuing current regimen. We  will, in all likelihood discharge patient      ___________________________________________                                            Ishmael Holter. Renard Matter, M.D.   AGM/MEDQ  D:  11/15/2003  T:  11/15/2003  Job:  [id number]

## 2011-02-16 NOTE — Consult Note (Signed)
NAME:  [name], [name]                         ACCOUNT NO.:  [id number]   MEDICAL RECORD NO.:  [id number]                   PATIENT TYPE:  INP   LOCATION:  A304                                 FACILITY:  [facility]   PHYSICIAN:  R. Roetta Sessions, M.D.              DATE OF BIRTH:  19-Apr-1926   DATE OF CONSULTATION:  11/13/2003  DATE OF DISCHARGE:                                   CONSULTATION   REASON FOR CONSULTATION:  Nausea, vomiting and diarrhea.   PRIMARY CARE PHYSICIAN:  Angus G. Renard Matter, M.D.   HISTORY OF PRESENT ILLNESS:  [name] is a pleasant 77 year old  Caucasian female who was in her usual state of fair health until two weeks  ago when she started experiencing left lower quadrant abdominal pain  reminiscent of her prior bouts of diverticulitis. She has seen Dr. Renard Matter  and her pain waxed and waned over the past two weeks.  CT scan of the  abdomen and pelvis from November 08, 2003 demonstrated findings consistent  with left sided diverticulitis involving the descending colon.  She also had  a low attenuation area within the uterus raising the question of endometrial  lesions.  It was recommended that __________ be obtained.   By her report, she was started on Cipro and Flagyl last week. She has not  been eating very much because she has been feeling poorly. She started  having intermittent nausea and vomiting and nonbloody diarrhea in  association with this regimen. She was seen yesterday and she tells me that  she stopped taking Cipro and Flagyl yesterday.  Her abdominal pain resolved  several days ago and she has been afebrile.   She tells me since she was admitted to the hospital yesterday, her nausea,  vomiting and diarrhea have subsided and she tells me she has had only one  bowel movement in the past 24 hours which is formed.   Admission labs show a white count of 5.6, H&H 11.5 and 33.2, MCV 84.7.  Sodium 136, potassium 3.8, chloride 98, CO2 31, glucose 113,  BUN 10,  creatinine 0.8.  Bilirubin 0.6, alkaline phosphatase 53, SGOT 15, SGPT 11,  total protein 5.9.  Urinalysis revealed a large amount of blood, negative  protein, trace leukocytes, negative nitrites, 7-10 white cells, RBC 3-6.  Cultures have not been ordered as far as I can tell at this time.   We saw this nice lady on October 01, 2003 with nausea and vomiting  following right hip surgery related to fracture.  It was felt she had a  postoperative ileus and was given a short course of Reglan which was  associated with resolution in those symptoms. She did drop her hemoglobin  significantly felt to be related to the hip fracture requiring transfusion.   According to the patient's medical record, Dr. Lovell Sheehan has performed a  colonoscopy on this lady within the last three years  and reportedly this  procedure revealed only diverticulosis and no history of polyps or other  abnormalities.   PAST MEDICAL HISTORY:  Notable for diverticulosis with history of  diverticulitis in the past. She has been taking Citrucel regularly. History  of glaucoma, history of recurrent UTI.  She had a UTI when she was  hospitalized in December 2004.  Right hip fracture status post bipolar hip  replacement by Dr. Gurney Maxin on December 26.  Status post bilateral  eyelid surgery.      ___________________________________________                                            Jonathon Bellows, M.D.   RMR/MEDQ  D:  11/13/2003  T:  11/13/2003  Job:  [id number]

## 2011-02-16 NOTE — H&P (Signed)
NAME:  [name], [name]                          ACCOUNT NO.:  [id number]   MEDICAL RECORD NO.:  [id number]                   PATIENT TYPE:  INP   LOCATION:  A330                                 FACILITY:  [facility]   PHYSICIAN:  Vickki Hearing, M.D.           DATE OF BIRTH:  Jan 30, 1926   DATE OF ADMISSION:  09/24/2003  DATE OF DISCHARGE:                                HISTORY & PHYSICAL   Patient was admitted on September 24, 2003 with a right hip fracture.   CHIEF COMPLAINT:  Right hip pain.   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  She came to the emergency room for evaluation, clinical and  radiographic exam showed a right hip fracture.  She was complaining of  severe pain and inability to walk or move her right leg.   She was admitted and placed in traction, started on morphine IV for pain  along with Darvocet.   On insertion of the Foley catheter the urinalysis came back positive for  urinary tract infection and she will be started on Bactrim.   PAST/FAMILY/SOCIAL HISTORY AND REVIEW OF SYSTEMS:  The patient reports a  history of diverticulitis with constipation.  She takes Metamucil.  She  takes an occasional aspirin.  She has never had surgery other than on her  eyelid.  She has glaucoma.  She has no allergies.  She does not smoke,  drink, or abuse drugs.   PHYSICAL EXAMINATION:  VITAL SIGNS:  Patient had a temperature max of 100,  on admission her blood pressure was 148/104 with a pulse of 95 and a  respiratory rate of 20.  MENTAL STATUS:  Oriented x3, judgment normal, mood and affect normal.  HEAD:  No trauma.  No Battle signs or raccoon eyes.  NECK:  Nontender, painless range of motion, trachea midline.  EYES:  Pupils equal, reactive to light.  Extraocular muscles intact.  EARS/NOSE AND THROAT:  Normal external inspection.  RESPIRATORY:  Chest  nontender.  CARDIOVASCULAR:  Normal pulse and perfusion.  No edema, temperature changes,  or swelling.  ABDOMEN:  Nontender, soft.  No organomegaly.  GENITAL/RECTAL:  Deferred.  NEUROLOGIC:  CNS normal.  Sensation normal.  SKIN:  Intact, warm, dry.  No rashes or lesions.  BACK:  Exam deferred.  EXTREMITY:  Right hip tenderness greater trochanter, painful log roll.   RADIOGRAPHS:  AP pelvis and right hip fracture femoral neck right hip.  Chest x-ray report pending.   LABORATORIES:  Normal except for potassium 3.1 and PT 16.5.   ELECTROCARDIOGRAM:  Normal sinus rhythm with a left anterior fascicular  block.   ASSESSMENT AND PLAN:  The patient has a right hip fracture.  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 Butch Penny and will need a bipolar hip  replacement.  We will correct her potassium with K-Dur.  The PT of 16.5 with  a normal INR and PTT should not be of any issue.  The patient has accepted  the risks and benefits of the procedure and wishes to proceed with hip  replacement.     ___________________________________________                                         Vickki Hearing, M.D.   SEH/MEDQ  D:  09/25/2003  T:  09/25/2003  Job:  [id number]

## 2011-02-16 NOTE — Group Therapy Note (Signed)
[name], PAUL                         ACCOUNT NO.:  [id number]   MEDICAL RECORD NO.:  [id number]                   PATIENT TYPE:  INP   LOCATION:  A318                                 FACILITY:  [facility]   PHYSICIAN:  Angus G. Renard Matter, M.D.              DATE OF BIRTH:  November 14, 1925   DATE OF PROCEDURE:  DATE OF DISCHARGE:                                   PROGRESS NOTE   SUBJECTIVE:  This patient seems to be doing better of vancomycin.  Her  Lomotil was decreased to one tablet q.i.d.  IV fluids were decreased to 75  cc per hour.  The patient remains afebrile.   OBJECTIVE:  Vital signs:  Blood pressure 121/71, respirations 20, pulse 77,  temperature 98.6.  Lungs clear to P&A.  Heart, regular rhythm.  No palpable  organs or masses.   ASSESSMENT:  The patient was readmitted with flare of Clostridium difficile  colitis and hypokalemia.   PLAN:  Continue current regimen.      ___________________________________________                                            [name]. Renard Matter, M.D.   AGM/MEDQ  D:  01/13/2004  T:  01/13/2004  Job:  [id number]

## 2011-02-16 NOTE — Group Therapy Note (Signed)
[name], [name]               ACCOUNT NO.:  [id number]   MEDICAL RECORD NO.:  [id number]          PATIENT TYPE:  INP   LOCATION:  A323                          FACILITY:  [facility]   PHYSICIAN:  Angus G. Renard Matter, MD   DATE OF BIRTH:  Feb 05, 1926   DATE OF PROCEDURE:  DATE OF DISCHARGE:                                   PROGRESS NOTE   This patient has a history of Clostridium difficile colitis and hip  fracture.  Was admitted with lower abdominal pain and CT showed suspicion  for appendicitis.  She was seen in consultation by gastroenterology service.  A colonoscopy was performed and it was felt that this was a flare of C.  difficile.  She has no symptoms to suggest appendicitis.   OBJECTIVE:  VITAL SIGNS:  Blood pressure 103/61, respirations 20, pulse 71,  temperature 98.2.  Patient's most current CBC:  WBC 6300 with hemoglobin  12.1, hematocrit 34.7.  Electrolytes:  Sodium 133, potassium 4.7, chloride  198, CO2 29, glucose 137.  LUNGS:  Clear to P&A.  HEART:  Regular rhythm.  ABDOMEN:  Minimal tenderness over lower abdomen.   ASSESSMENT:  Patient was admitted with abdominal pain, a flare of  Clostridium difficile colitis.   PLAN:  Continue current antibiotic therapy.      AGM/MEDQ  D:  10/31/2004  T:  10/31/2004  Job:  [id number]

## 2011-02-16 NOTE — Discharge Summary (Signed)
NAME:  [name], [name]                          ACCOUNT NO.:  [id number]   MEDICAL RECORD NO.:  [id number]                   PATIENT TYPE:  INP   LOCATION:  A330                                 FACILITY:  [facility]   PHYSICIAN:  Vickki Hearing, M.D.           DATE OF BIRTH:  [birth year]   DATE OF ADMISSION:  09/24/2003  DATE OF DISCHARGE:  10/01/2003                                 DISCHARGE SUMMARY   ADDENDUM   The patient was scheduled for discharge on October 01, 2003, but became  nauseated and was unable to tolerate food.  Consult was obtained with Dr.  Jena Gauss who started the patient on Reglan prior to meals at 5 mg.  She  continued to improve in terms of ability to eat and nausea became much less.  She was also continued on her Protonix.   Due to some right lower extremity swelling, mainly in the thigh which was  believed to be due to a hematoma.  An ultrasound was ordered on October 04, 2003, with results pending.  If the ultrasound is normal/negative for clot,  the patient can be discharged to skilled nursing facility when bed  availability becomes known.  Final laboratory results after her third unit  of blood, showed hemoglobin 9.7 on October 02, 2003.  Her Chem 7 of October 03, 2003, showed a sodium of 130, potassium 4.1, chloride 98, CO2 25, glucose  116, BUN and creatinine 11 and 0.8 with calcium 7.6.   The patient was ambulated 250 feet and able to stand up with standby assist.   Additional medication will be Reglan 5 mg p.o. prior to meals.  All other  medicines will continue.  We did decrease her Lovenox to 30 mg once a day.  Otherwise, all other medications were the same.     ___________________________________________                                         Vickki Hearing, M.D.   SEH/MEDQ  D:  10/04/2003  T:  10/04/2003  Job:  [id number]

## 2011-02-16 NOTE — Discharge Summary (Signed)
[name], [name]                         ACCOUNT NO.:  [id number]   MEDICAL RECORD NO.:  [id number]                   PATIENT TYPE:  INP   LOCATION:  A318                                 FACILITY:  [facility]   PHYSICIAN:  [name], M.D.              DATE OF BIRTH:  Feb 02, 1926   DATE OF ADMISSION:  01/08/2004  DATE OF DISCHARGE:  01/14/2004                                 DISCHARGE SUMMARY   DIAGNOSES:  1. Intestinal infection secondary to Clostridium difficile.  2. Hypovolemia.  3. Hypokalemia.  4. Status post hip joint replacement.  5. History of diverticulitis, but condition stable at the time of her     discharge.  6. Hyponatremia.   HISTORY OF PRESENT ILLNESS:  A 77 year old white female who presented  herself to the emergency department with chief complaint of weakness,  nausea, vomiting, diarrhea, abdominal cramps and abdominal pain.  This  patient has a history of having been hospitalized previously for treatment  of Clostridium difficile colitis, diverticulitis, and also had hip  replacement surgery in December of 2004.  These problems began following  this, and resulted in several hospitalizations and long-term use of  vancomycin.  She was started on IV normal saline at 200 cc and hour and  admitted.   PHYSICAL EXAMINATION:  GENERAL:  An alert, uncomfortable white female.  VITAL SIGNS:  Blood pressure 96/52, pulse 108, respirations 18, temperature  100.9.  HEENT:  Eyes:  PERRLA, TM's negative.  Oropharynx benign.  NECK:  Supple, no JVD or thyroid abnormalities.  LUNGS:  Clear to P&A.  HEART:  Regular rhythm, sinus tachycardia, no murmurs.  ABDOMEN:  Slight tenderness in the upper abdomen.  SKIN:  Warm and dry.  EXTREMITIES:  Free of edema.  NEUROLOGIC:  No focal deficits.   LABORATORY DATA:  CBC:  WBC 7600, hemoglobin 12.1, hematocrit 35.6.  Chemistries on admission, sodium 127, potassium 3.2, chloride 94, cO2 25,  glucose 127, BUN 11, creatinine 0.9,  calcium 8.4.  Subsequent chemistries on  January 11, 2004.  Sodium 135, potassium 3.9, chloride 102, cO2 26, glucose  109.  BUN 2, creatinine 0.7.  Liver panel, SGOT 15, SGPT 8, alkaline  phosphatase 41, total bilirubin 1.3.  Stool cultures negative for pathogens.  Stool positive for Clostridium difficile.   HOSPITAL COURSE:  The patient at the time of her admission was placed on a  liquid diet.  She was given Lomotil 2.5 mg two tablets q.i.d.  IV Zofran 4  mg q.8h. p.r.n. for nausea.  Vancomycin 250 mg p.o. t.i.d.  Her IV fluids  were changed to D5-1/2 normal saline at 125 cc per hour on January 09, 2004.  She was given 20 mEq of Kcl to correct hypokalemia.  She was started on  Lactinex t.i.d. with meals and yogurt with meals.  She was seen in  consultation by gastroenterology service.  Her Lomotil was decreased to one  q.i.d.  The patient felt slowly and progressively improved during her  hospital stay, and was able to be discharged on the 6th hospital day.   DISCHARGE MEDICATIONS:  1. Vancomycin 250 mg t.i.d.  2. Lactinex chewable, one t.i.d.  3. Lomotil 2.5 mg q.i.d.     ___________________________________________                                         Ishmael Holter. Renard [name], M.D.   AGM/MEDQ  D:  02/01/2004  T:  02/01/2004  Job:  [id number]

## 2011-02-16 NOTE — Group Therapy Note (Signed)
[name], [name]                           ACCOUNT NO.:  [id number]   MEDICAL RECORD NO.:  [id number]                  PATIENT TYPE:   LOCATION:                                       FACILITY:   PHYSICIAN:  Angus G. Renard Matter, M.D.              DATE OF BIRTH:   DATE OF PROCEDURE:  11/23/2003  DATE OF DISCHARGE:                                   PROGRESS NOTE   This patient's condition has improved.  She is being treated for C.  difficile colitis with vancomycin.  Her condition remains stable.   OBJECTIVE:  VITAL SIGNS: Blood pressure 133/60, respirations 20, pulse 84,  temperature 98.7.  LUNGS:  Clear to P&A.  HEART:  Regular rhythm.  ABDOMEN:  No palpable organs or masses.   ASSESSMENT:  The patient has had a flare of Clostridium difficile colitis.  Does have anemia and hypokalemia which has improved.   PLAN:  Plan to continue and advance diet.  Continue current regimen.  Continue to monitor CBC and electrolytes.      ___________________________________________                                            Ishmael Holter Renard Matter, M.D.   AGM/MEDQ  D:  11/23/2003  T:  11/23/2003  Job:  [id number]

## 2011-02-16 NOTE — Op Note (Signed)
NAME:  [name], [name]                          ACCOUNT NO.:  [id number]   MEDICAL RECORD NO.:  [id number]                   PATIENT TYPE:  INP   LOCATION:  A330                                 FACILITY:  [facility]   PHYSICIAN:  Vickki Hearing, M.D.           DATE OF BIRTH:  [birth year]   DATE OF PROCEDURE:  09/26/2003  DATE OF DISCHARGE:                                 OPERATIVE REPORT   HISTORY:  [name] is a 77 year old female, healthy, with no medical  problems, who fell and fractured her right hip, could not walk, and  complained of pain.   DATE OF SURGERY:  September 26, 2003.   DATE OF DICTATION:  September 26, 2003.   PREOPERATIVE DIAGNOSIS:  Closed right femoral neck hip fracture.   POSTOPERATIVE DIAGNOSIS:  Closed right femoral neck hip fracture.   PROCEDURE:  Bipolar hip replacement.   IMPLANTS:  Press-Fit 10 stem, 0 neck, with a 49-mm head.   ANESTHESIA:  Spinal anesthetic was done by Jonny Ruiz __________.   ESTIMATED BLOOD LOSS:  250 mL.   FLUIDS:  500 mL of Hespan, 1 liter of crystalloid.   URINE OUTPUT:  700 mL.   FINDINGS:  Complete fracture of the right femoral neck.   The patient was identified as [name].  My initials were placed over  the right hip.  She was given 1g of Ancef.  She was taken to the operating  room.  She had a spinal anesthetic.  She was placed with the right side up,  left side down.  A time-out was taken to confirm the procedure, extremity,  and patient.  Everyone agreed, and a prep and drape was performed on the  right lower extremity in the normal manner.   Skin incision was made over the greater trochanter, proximal femur, and  extended proximally with a gentle curve.  The subcutaneous tissue was  divided.  The fascia was split in line with the skin incision.  The  abductors and vastus lateralis were peeled from the greater trochanter and  proximal femur in one continuous layer.  The hip capsule was split and  preserved.  The  femoral head was removed.  The femoral head measured a 49.  The hip was dislocated with the anterior approach.  Boxed osteotome was used  to lateralize the entry point into the femoral canal.  A curette was used to  find the center of the canal.  A tapered reamer by hand was placed in the  canal.  The trochanteric reamer was then used.  Tapered reamers were  continued up to a size 10 Press-Fit.  Broaches, starting with a 7 and  working up to a 10 were then placed.  A trial reduction was performed with a  0 and a 49 head.  Full range of motion was noted with no dislocation.  The  hip was dislocated with traction and a bone hook  and now was suctioned dry.  Acetabulum was cleaned of debris and soft tissue.  Size 10 stem was placed,  0 neck, 49 head, and the hip was reduced.  A reduction was performed, and  range of motion was performed and matched the pre-implant motion test.  The  hip was irrigated.  The abductors were repaired through drill holes in the  femur and trochanter.  The capsule was also repaired.  The fascia was closed  with #1 interrupted suture using Bralon; then layered closure was performed  using 0 and 2-0 Vicryl.   Skin staples were used to close the skin; 30 mL of 0.5% Sensorcaine was  injected in the wound.  Sterile dressing was applied; abduction pillow was  applied, and the patient was taken to recovery room in stable condition.   POSTOPERATIVE PLAN:  1. Deep venous thrombosis prophylaxis with Lovenox, TEDs, Flowtron, early     mobilization.  2. Full weightbearing with a Haueter for six weeks.  3. Antibiotics for 24 hours.  4. Social service consult tomorrow and physical therapy consult tomorrow.      ___________________________________________                                            Vickki Hearing, M.D.   SEH/MEDQ  D:  09/26/2003  T:  09/26/2003  Job:  [id number]

## 2011-02-16 NOTE — Discharge Summary (Signed)
NAMESIMRAH, [name]               ACCOUNT NO.:  [id number]   MEDICAL RECORD NO.:  [id number]          PATIENT TYPE:  INP   LOCATION:  A323                          FACILITY:  [facility]   PHYSICIAN:  Angus G. Renard Matter, MD   DATE OF BIRTH:  1925-11-29   DATE OF ADMISSION:  10/29/2004  DATE OF DISCHARGE:  02/02/2006LH                                 DISCHARGE SUMMARY   DIAGNOSES:  1.  Colitis, secondary to Clostridium difficile.  2.  History of diverticulosis.   CONDITION ON DISCHARGE:  Stable and improved.   This 76 year old female has a past history of recurrent Clostridium  difficile colitis, which occurred following a hip fracture in 2004.  Apparently, has had extensive workup for colitis and several  hospitalizations.  She was treated with extended courses of both Flagyl and  vancomycin.  Apparently, it did finally resolve.  About a month prior to  this admission, she started having small, frequent, mucoid stools.  She had  been treated with Flagyl and Imodium.  It apparently began to improve.  Two  days prior to this admission,  stools were observed with multiple small,  mucoid stools per day.  Some of these were blood-streaked.  She had been  taking liquids to maintain her hydration.  Complained of feeling weak and  discomfort over her left lower quadrant.   PHYSICAL EXAMINATION:  GENERAL APPEARANCE:  A well-developed, well-nourished  white female.  VITAL SIGNS:  Blood pressure 120/73, pulse 101, temp 101.7.  HEENT:  Eyes:  PERRLA.  TM:  Negative.  Oropharynx:  Benign.  NECK:  Supple.  No JVD or thyroid abnormality.  LUNGS:  Clear to P&A.  HEART:  Regular rhythm.  ABDOMEN:  Slightly protuberant.  Active bowel sounds.  Tenderness in the  left lower quadrant.   LABORATORY DATA:  Admission CBC:  WBC 16,700, hemoglobin 14.4, hematocrit  41.9.  Subsequent CBC on October 30, 2004:  WBC 6300 with hemoglobin 12.1  and hematocrit 34.7.  Chemistries:  Sodium 136, potassium 3.7, chloride  96,  CO2 25, glucose 111, BUN 14, creatinine 0.9, calcium 9.2.  Total protein  6.5, albumin 4.  Subsequent chemistry on October 30, 2004:  Sodium 133,  potassium 4.7, chloride 98, CO2 29, glucose 137, BUN 11, creatinine 0.9,  calcium 8.6.  Liver enzymes:  SGOT 20, SGPT 18, alkaline phosphatase 52,  bilirubin 0.6, lipase 16.  Urinalysis:  With 3-6 wbc's and 3-6 rbc's.  Cultures:  Blood culture with no growth in five days.  C. diff positive  stool culture with routine pathogens negative.  No ova and parasites.  CT of  the abdomen:  Mildly thickened, enlarged appendix.  A small amount of free  fluid in the pelvis.   HOSPITAL COURSE:  The patient, at the time of her admission, was placed on  clear liquids.  Blood cultures were obtained.  These were negative.  She was  placed on D5 normal saline with 20 mEq of KCl at 100 cc an hour.  She was  continued on Flagyl 500 mg b.i.d. and Cipro 400 mg q.12h.  Stools  were  cultured for C. diff, O&P and C&S.  Stool was positive for C. diff.  She was  seen in consultation by the gastroenterology service.  Her Flagyl was  changed to 500 mg t.i.d. with Imodium 2 mg q.4h. p.r.n.  The patient, during  the hospital stay, progressively improved.  Her diet was advanced.  She did  have a colonoscopy performed by the gastroenterology service.  Fibrinous  pseudomembranes were seen throughout.  It was felt that she had diffuse  changes of colitis secondary to C. difficile.  The CT finding of  appendicitis was not thought to be present following colonoscopy, although  she did have a surgical consult.  The patient did improve throughout her  hospital stay and was able to be discharged after four days'  hospitalization, being sent home on vancomycin 250 mg q.i.d., Imodium 1  t.i.d., Flora-Q Caps daily, Xanax 0.25 mg q.4h. p.r.n.      AGM/MEDQ  D:  11/14/2004  T:  11/14/2004  Job:  [id number]

## 2011-02-16 NOTE — Group Therapy Note (Signed)
NAMEVIRGA, [name]                         ACCOUNT NO.:  [id number]   MEDICAL RECORD NO.:  [id number]                   PATIENT TYPE:  INP   LOCATION:  A318                                 FACILITY:  [facility]   PHYSICIAN:  Angus G. Renard Matter, M.D.              DATE OF BIRTH:  08-02-1926   DATE OF PROCEDURE:  01/10/2004  DATE OF DISCHARGE:                                   PROGRESS NOTE   This patient was admitted to the hospital with a history of episodes of  diarrhea, nausea, does have a history of Clostridium difficile colitis and  diverticulitis.  She did have a positive C. difficile on stool on this  occasion.  Continues to run low-grade fever.   OBJECTIVE:  VITAL SIGNS:  Blood pressure 111/56, respirations 18, pulse 84,  temp 100.  LUNGS:  Clear to P&A.  HEART:  Regular rhythm.  ABDOMEN:  Slight tenderness in upper abdomen.   ASSESSMENT:  1. Colitis secondary to Clostridium difficile.  2. Hypokalemia.   PLAN:  1. To continue her regimen.  2. Continue IV KCL and fluids.  3. Repeat BMET today.      ___________________________________________                                            Ishmael Holter. Renard Matter, M.D.   AGM/MEDQ  D:  01/10/2004  T:  01/10/2004  Job:  [id number]

## 2011-02-16 NOTE — Consult Note (Signed)
NAME:  [name], [name]                         ACCOUNT NO.:  [id number]   MEDICAL RECORD NO.:  [id number]                   PATIENT TYPE:  INP   LOCATION:  A318                                 FACILITY:  [facility]   PHYSICIAN:  R. Roetta Sessions, M.D.              DATE OF BIRTH:  [birth year]   DATE OF CONSULTATION:  03/28/2004  DATE OF DISCHARGE:                                   CONSULTATION   GASTROENTEROLOGY CONSULTATION:   CHIEF COMPLAINT:  Two-day history of abdominal pain and diarrhea.   HISTORY OF PRESENT ILLNESS:  Patient is a 77 year old Caucasian female who  presented to the emergency department with a complaint of abdominal pain and  diarrhea for 2 days.  The patient had previously been discharged from the  hospital on Feb 01, 2004 for treatment of Clostridium difficile colitis and  diverticulitis.  The patient had hip replacement surgery in December 2004  and was on antibiotic treatment.  The patient has been on vancomycin for the  past 30 days and once the vancomycin course was completed she was symptom  free for approximately 4 days before she began to experience abdominal pain  and diarrhea.   CURRENT MEDICATIONS:  1. Vancomycin 250 mg p.o. q.i.d.  2. Imodium 2 mg p.o. p.r.n. (following loose stool).  3. Zofran 4 mg IV q.8h. p.r.n.   PAST MEDICAL HISTORY:  As stated above hip replacement surgery December  2004, history of diverticulitis with treatment, and two hospitalizations for  treatment of C. difficile colitis.   ALLERGIES:  No known drug allergies.   SOCIAL HISTORY:  The patient states she does not smoke, drink, or abuse  drugs.   PHYSICAL EXAMINATION:  VITALS:  Temperature 100.4, pulse 102, respirations  20, BP 120/59.  HEENT:  Normocephalic, atraumatic.  Sclerae are clear, nonicteric.  Conjunctivae are pink.  EXTREMITIES:  Extremities have no edema, clubbing, or cyanosis.  CARDIOVASCULAR:  Tachycardic.  No murmurs, rubs, or gallops.  PULMONARY:   Respirations clear to auscultation bilaterally.  ABDOMEN:  Slightly distended, nontender, there is no hepatosplenomegaly or  masses, bowel sounds are hyperactive.   LABORATORIES:  WBC 7.7, hemoglobin 13.9, hematocrit 40, platelets 244.  Sodium 133, potassium 3.6, chloride 100, CO2 24, glucose 160, BUN 25,  creatinine 1.1, total bilirubin 0.9, direct bilirubin 0.2, indirect  bilirubin 0.7, alkaline phosphatase 52, SGOT 21, SGPT 10, total protein 6.2,  albumin 3.5, calcium 5.  Pending C. difficile toxin.   IMPRESSION:  [age]-year-old Caucasian female with a history of  Clostridium difficile colitis admitted for a 2-day history of abdominal pain  and diarrhea after being on outpatient vancomycin for the past 30 days.  Patient began having symptoms approximately 4 days after she discontinued  her course of vancomycin.   RECOMMENDATIONS:  Do stool studies for O&P, culture and sensitivity and  wbc's, increase vancomycin to 500 mg t.i.d., follow up on results of stool  studies.  Thank you for allowing [place] to participate in this patient's care.     ________________________________________  ___________________________________________  Ashok Pall, PA                           Jonathon Bellows, M.D.   GC/MEDQ  D:  03/28/2004  T:  03/28/2004  Job:  [id number]

## 2011-02-16 NOTE — Group Therapy Note (Signed)
NAMEKAYCEE, HAYCRAFT               ACCOUNT NO.:  [id number]   MEDICAL RECORD NO.:  [id number]          PATIENT TYPE:  INP   LOCATION:  A323                          FACILITY:  [facility]   PHYSICIAN:  Angus G. Renard Matter, MD   DATE OF BIRTH:  May 20, 1926   DATE OF PROCEDURE:  DATE OF DISCHARGE:                                   PROGRESS NOTE   This patient has had a flare of Clostridium difficile.  She had colonoscopy  which showed patchy changes of colitis and white areas of colon which was  thought to be resolving pseudomembranes.  Patient currently is on vancomycin  250 mg t.i.d.  Is feeling better.   OBJECTIVE:  VITAL SIGNS:  Blood pressure 129/73, respirations 20, pulse 77,  temp 98.2.  LUNGS:  Clear to P&A.  HEART:  Regular rhythm.  ABDOMEN:  Mildly distended.   ASSESSMENT:  Patient is being treated for a flare of Clostridium difficile  colitis.   PLAN:  To continue current intravenous fluids, continue antibiotic coverage.      AGM/MEDQ  D:  11/02/2004  T:  11/02/2004  Job:  [id number]

## 2011-02-16 NOTE — Consult Note (Signed)
NAME:  [name], [name]                         ACCOUNT NO.:  [id number]   MEDICAL RECORD NO.:  [id number]                   PATIENT TYPE:  INP   LOCATION:  A304                                 FACILITY:  [facility]   PHYSICIAN:  R. Roetta Sessions, M.D.              DATE OF BIRTH:  [birth year]   DATE OF CONSULTATION:  11/13/2003  DATE OF DISCHARGE:                                   CONSULTATION   ADDENDUM   ADMISSION MEDICATIONS:  Protonix, folic acid, B12, Cipro, Flagyl,  hyoscyamine, aspirin 81 mg.   ALLERGIES:  No known drug allergies.   FAMILY HISTORY:  Negative for chronic GI or liver illnesses.  No history of  GI neoplasia.   SOCIAL HISTORY:  The patient is widowed.  [name] has 3 children.  [name] has no  tobacco or alcohol.   REVIEW OF SYSTEMS:  [name] has not had any chest pain dyspnea.  No recent  reflux symptoms of odynophagia, dysphagia, otherwise as in history of  present illness.   PHYSICAL EXAMINATION:  GENERAL:  A pleasant elderly lady who is alert and  conversant in no acute distress.  VITAL SIGNS:  Temperature 97, pulse 70, respiratory rate 20, BP 120/59.  Weight 122.8 pounds ([name] weighed 127 on December 31).  SKIN:  Warm and dry, no jaundice.  No cutaneous stigmata of chronic liver  disease.  HEENT:  No sclerae icterus.  Conjunctivae are pink.  Oral cavity no lesions.  CHEST:  Lungs are clear to auscultation.  CARDIAC:  Regular rate and rhythm without murmur, gallop, or rub.  BREASTS:  Exam is deferred.  ABDOMEN:  Nondistended, positive bowel sounds.  The abdomen is entirely soft  and nontender without appreciable mass or organomegaly.  EXTREMITIES:  No edema.  [name] has TED hose in place.   LABORATORY DATA:  Total bilirubin 0.6, alkaline phosphatase 53, SGOT 15.  SGPT 11, albumin 3.6.   IMPRESSION:  This patient is a pleasant, 77 year old lady who recently  suffered a bout of acute diverticulitis.  This has responded nicely to a  course of  Cipro and Flagyl, but [name] has  had nausea and vomiting and  nonbloody diarrhea; at least temporarily related to the administration of  these 2 agents. These 2 agents have not been withdrawn and her symptoms have  dramatically improved.  Clinically diverticulitis has resolved.  I suspect  that her recent acute GI symptoms of nausea, vomiting, and diarrhea are more  related to the antibiotics than anything else.  [name] is rapidly approaching  her baseline.  [name] does have pyuria and hematuria on her UA.  [name] needs to  have a urine culture.   RECOMMENDATIONS:  1. Allow a low residue diet.  2. Ambulate with assistance.  3. Culture of urine.  4. [name] will need to get back on a fiber supplement; I would like her to try     Metamucil 1 dose  daily, resuming fiber supplementation in 1 week.  5. [name] was noted to have abnormal uterus on recent CT scanning and fully     agree with plans for pelvic ultrasound.  Further recommendations to     follow.   I would like to thank Dr. Renard Matter for allowing me to see this nice lady once  again.      ___________________________________________                                            Jonathon Bellows, M.D.   RMR/MEDQ  D:  11/13/2003  T:  11/13/2003  Job:  [id number]   cc:   R. Roetta Sessions, M.D.  [id number]  [place]  [place] [address]  Fax: [phone]

## 2011-02-16 NOTE — Group Therapy Note (Signed)
[name], [name]                         ACCOUNT NO.:  [id number]   MEDICAL RECORD NO.:  [id number]                   PATIENT TYPE:  INP   LOCATION:  A318                                 FACILITY:  [facility]   PHYSICIAN:  Angus G. Renard Matter, M.D.              DATE OF BIRTH:  [birth year]   DATE OF PROCEDURE:  DATE OF DISCHARGE:                                   PROGRESS NOTE   This patient is being treated for Clostridium difficile colitis.  She has  remained afebrile.   OBJECTIVE:  VITAL SIGNS:  Blood pressure 110/61, respirations 18, pulse 81,  temp 98.4.  LUNGS:  Clear to P&A.  HEART:  Regular rhythm.  ABDOMEN:  No palpable organs or masses.  Slight tenderness.   ASSESSMENT:  Patient is being treated with intravenous fluids for  hyperkalemia.  Is also being treated for Clostridium difficile colitis.   PLAN:  Continue the current regimen.      ___________________________________________                                            Ishmael Holter. Renard Matter, M.D.   AGM/MEDQ  D:  01/12/2004  T:  01/12/2004  Job:  [id number]

## 2011-02-16 NOTE — H&P (Signed)
NAME:  [name], [name]                         ACCOUNT NO.:  [id number]   MEDICAL RECORD NO.:  [id number]                   PATIENT TYPE:  INP   LOCATION:  A312                                 FACILITY:  [facility]   PHYSICIAN:  Angus G. Renard Matter, M.D.              DATE OF BIRTH:  03-15-1926   DATE OF ADMISSION:  11/18/2003  DATE OF DISCHARGE:                                HISTORY & PHYSICAL   HISTORY OF PRESENT ILLNESS:  A 77 year old white female who was admitted  with chief complaint being uncontrolled vomiting and diarrhea of several  days duration.  This patient was recently hospitalized for treatment of  diverticulitis, gastroenteritis, and dehydration.  She improved.  However,  she had recurrence of her symptoms.  Stools were positive for Clostridium  difficile.  The patient was started on vancomycin p.o. 250 mg q.i.d.  However, she was unable to keep medication down.  She was vomiting  intermittently and having frequent stools which were unable to be controlled  with Imodium.  The patient was therefore readmitted to the hospital for  further evaluation and for intravenous fluids.   LABORATORY DATA:  CBC, WBC 5600, hemoglobin 11.5, hematocrit 33.7.  Chemistries:  Sodium 135, potassium 3.8, chloride 98, CO2 31.  Glucose 113.  BUN 10, creatinine 0.8.   SOCIAL HISTORY:  The patient does not smoke or drink alcohol.   FAMILY HISTORY:  See previous record.   PAST MEDICAL/SURGICAL HISTORY:  The patient has a history of having had  treatment previously for diverticulitis.  She has been hospitalized recently  for total hip replacement on the right in December, 2004.  The patient spent  a short period of time in the nursing center and has had a recent  hospitalization for treatment of gastroenteritis.   ALLERGIES:  No known allergies.   MEDICATIONS:  1. Levbid one b.i.d.  2. Vancomycin 250 mg q.i.d.   REVIEW OF SYSTEMS:  HEENT negative.  Cardiopulmonary:  No cough, hemoptysis  or  dyspnea.  GI:  Bouts of nausea, vomiting, diarrhea prior to admission.  GU:  No dysuria or hematuria.   PHYSICAL EXAMINATION:  GENERAL:  An alert female.  VITAL SIGNS:  Blood pressure 104/52, respirations 20, pulse 107, temperature  101.5.  HEENT:  Eyes:  PERRLA/ TM's negative.  Oropharynx is benign.  NECK:  Supple, no JVD or thyroid abnormalities.  LUNGS:  Clear to P&A.  HEART:  Regular regular rhythm, no murmurs.  ABDOMEN:  Slight tenderness in the mid abdomen.  PELVIC:  No performed.  EXTREMITIES:  Free of edema.   DIAGNOSES:  1. Clostridium difficile colitis.  2. Diverticulitis.  3. History of recent total hip replacement in December of 2004.     ___________________________________________  Angus G. Renard Matter, M.D.   AGM/MEDQ  D:  11/19/2003  T:  11/19/2003  Job:  [id number]

## 2011-02-23 ENCOUNTER — Ambulatory Visit (INDEPENDENT_AMBULATORY_CARE_PROVIDER_SITE_OTHER): Payer: Medicare Other | Admitting: Cardiology

## 2011-02-23 ENCOUNTER — Encounter: Payer: Self-pay | Admitting: Cardiology

## 2011-02-23 VITALS — BP 142/78 | HR 83 | Ht 65.0 in | Wt 112.0 lb

## 2011-02-23 DIAGNOSIS — R030 Elevated blood-pressure reading, without diagnosis of hypertension: Secondary | ICD-10-CM

## 2011-02-23 DIAGNOSIS — R9431 Abnormal electrocardiogram [ECG] [EKG]: Secondary | ICD-10-CM

## 2011-02-23 DIAGNOSIS — R002 Palpitations: Secondary | ICD-10-CM

## 2011-02-23 NOTE — Assessment & Plan Note (Signed)
Continue follow-up with primary care 

## 2011-02-23 NOTE — Assessment & Plan Note (Signed)
ECG reviewed above. 2-D echocardiogram will be obtained to exclude any cardiac structural abnormalities or wall motion abnormalities.

## 2011-02-23 NOTE — Assessment & Plan Note (Signed)
Possibly related to ectopy, either atrial or ventricular based on description, however cannot rule out intermittent arrhythmia such as atrial fibrillation. Baseline ECG shows sinus rhythm. It is reassuring that she has not had any dizziness or chest pain with these symptoms. Pattern seems to be resolving. We discussed the matter, and will provide a 7 day cardiac monitor to see if we can capture anything objective that might help guide further evaluation.

## 2011-02-23 NOTE — Progress Notes (Signed)
Clinical Summary [name] is a 77 y.o.female referred for cardiology consultation by Dr. Renard [name]. She is here with her daughter. She reports an episode of palpitations described as a forceful heartbeat that occurred approximately one month ago after taking a dose of triamterene. She has had an intermittent feeling of forceful heartbeats and skips since that time, although the pattern seems to be less frequent. She has had no dizziness or chest pain with these symptoms.   She does have functional limitation related to back and leg pain. She is unable to stand for long periods of time related to this. With her usual levels of activity she reports no progressive shortness of breath or chest pain. She does report dependent leg edema that is overall mild and not particularly troubling on regular basis. This resolves by putting her feet up.  Recent lab work from April 30 showed sodium 136, potassium 5.1, glucose 100, BUN 12, creatinine 0.65. Faxed copy of ECG from early May showed sinus rhythm with right bundle branch block pattern, low voltage in limb leads.   Allergies  Allergen Reactions  . Ciprofloxacin   . Flagyl (Metronidazole Hcl)     Current outpatient prescriptions:acetaminophen (TYLENOL) 500 MG tablet, Take 500 mg by mouth every 6 (six) hours as needed.  , Disp: , Rfl: ;  aspirin 325 MG tablet, Take 325 mg by mouth daily.  , Disp: , Rfl: ;  calcium carbonate (OS-CAL) 600 MG TABS, Take 600 mg by mouth daily.  , Disp: , Rfl: ;  Glucosamine-Chondroitin (OSTEO BI-FLEX REGULAR STRENGTH PO), Take 1 tablet by mouth daily.  , Disp: , Rfl:  Multiple Vitamins-Minerals (MULTIVITAMIN WITH MINERALS) tablet, Take 1 tablet by mouth daily.  , Disp: , Rfl: ;  Naphazoline-Polyethyl Glycol (EYE DROPS) 0.012-0.2 % SOLN, Apply 1 drop to eye daily.  , Disp: , Rfl: ;  Potassium Gluconate 595 MG CAPS, Take 1 capsule by mouth daily.  , Disp: , Rfl: ;  Probiotic Product (FLORA-Q PO), Take 1 tablet by mouth daily.  ,  Disp: , Rfl:  DISCONTD: aspirin 81 MG tablet, Take 81 mg by mouth daily.  , Disp: , Rfl:   Past Medical History  Diagnosis Date  . Clostridium difficile colitis   . Glaucoma   . Diverticulosis   . Diverticulitis   . Recurrent UTI     Past Surgical History  Procedure Date  . Orif right hip 2004    Dr.Harrison  . Bilateral eyelid surgery     Family History  Problem Relation Age of Onset  . Diabetes type II      Social History [name] reports that she has never smoked. She has never used smokeless tobacco. [name] reports that she does not drink alcohol.  Review of Systems No cough, fevers, chills. No exertional chest pain. Chronic stable dyspnea on exertion. No orthopnea or PND. No reported melena or hematochezia.  Physical Examination Filed Vitals:   02/23/11 0830  BP: 142/78  Pulse: 83  Thin elderly woman in no acute distress. HEENT: Conjunctiva and lids normal, oropharynx with moist mucosa. Neck: Supple, no elevated JVP or carotid bruits, no thyromegaly. Lungs: Clear to auscultation, nonlabored. Cardiac: Regular rate and rhythm with soft systolic murmur at the base, no ectopy, no S3. Abdomen: Soft, nontender, bowel sounds present. Skin: Warm and dry. Musculoskeletal: Mild kyphosis. Extremities: No pitting edema. Mild venous stasis. Neuropsychiatric: Alert and oriented x3, affect appropriate.    ECG Normal sinus rhythm with right bundle branch block,  Q waves in anteroseptal leads, LAFB.   Problem List and Plan

## 2011-02-23 NOTE — Patient Instructions (Signed)
Your physician has requested that you have an echocardiogram. Echocardiography is a painless test that uses sound waves to create images of your heart. It provides your doctor with information about the size and shape of your heart and how well your heart's chambers and valves are working. This procedure takes approximately one hour. There are no restrictions for this procedure.  Your physician has recommended that you wear an event monitor. Event monitors are medical devices that record the heart's electrical activity. Doctors most often [place] these monitors to diagnose arrhythmias. Arrhythmias are problems with the speed or rhythm of the heartbeat. The monitor is a small, portable device. You can wear one while you do your normal daily activities. This is usually used to diagnose what is causing palpitations/syncope (passing out).  Your physician recommends that you schedule a follow-up appointment in: 3 to 4 weeks

## 2011-03-05 ENCOUNTER — Ambulatory Visit (HOSPITAL_COMMUNITY)
Admission: RE | Admit: 2011-03-05 | Discharge: 2011-03-05 | Disposition: A | Discharge status: Home / Self Care | Payer: Medicare Other | Source: Ambulatory Visit | Attending: Cardiology | Admitting: Cardiology

## 2011-03-05 DIAGNOSIS — R002 Palpitations: Secondary | ICD-10-CM | POA: Insufficient documentation

## 2011-03-05 DIAGNOSIS — I517 Cardiomegaly: Secondary | ICD-10-CM

## 2011-03-06 ENCOUNTER — Encounter: Payer: Self-pay | Admitting: Cardiology

## 2011-03-08 ENCOUNTER — Telehealth: Payer: Self-pay | Admitting: *Deleted

## 2011-03-08 DIAGNOSIS — R002 Palpitations: Secondary | ICD-10-CM

## 2011-03-08 NOTE — Telephone Encounter (Signed)
Pt came to office with event monitor Very upset and unwilling with coaxing to wear monitor Called [facility]ionet and sent monitor back Spoke with Dr. Ceasar Lund 48hr holter monitor for pt Spoke with pt's daughter Toniann Fail and gave new orders

## 2011-03-13 ENCOUNTER — Ambulatory Visit (HOSPITAL_COMMUNITY)
Admission: RE | Admit: 2011-03-13 | Discharge: 2011-03-13 | Disposition: A | Discharge status: Home / Self Care | Payer: Medicare Other | Source: Ambulatory Visit | Attending: Cardiology | Admitting: Cardiology

## 2011-03-13 DIAGNOSIS — R002 Palpitations: Secondary | ICD-10-CM | POA: Insufficient documentation

## 2011-03-15 ENCOUNTER — Ambulatory Visit: Payer: Medicare Other | Admitting: Cardiology

## 2011-03-19 ENCOUNTER — Encounter: Payer: Self-pay | Admitting: Cardiology

## 2011-03-30 ENCOUNTER — Encounter: Payer: Self-pay | Admitting: Cardiology

## 2011-03-30 ENCOUNTER — Ambulatory Visit (INDEPENDENT_AMBULATORY_CARE_PROVIDER_SITE_OTHER): Payer: Medicare Other | Admitting: Cardiology

## 2011-03-30 VITALS — BP 149/81 | HR 83 | Ht 65.0 in | Wt 115.0 lb

## 2011-03-30 DIAGNOSIS — R030 Elevated blood-pressure reading, without diagnosis of hypertension: Secondary | ICD-10-CM

## 2011-03-30 DIAGNOSIS — R002 Palpitations: Secondary | ICD-10-CM

## 2011-03-30 NOTE — Patient Instructions (Signed)
**Note De-identified  Obfuscation** Your physician recommends that you continue on your current medications as directed. Please refer to the Current Medication list given to you today.  Your physician recommends that you schedule a follow-up appointment in: as needed  

## 2011-03-30 NOTE — Assessment & Plan Note (Signed)
Followup blood pressure with Dr. Renard Matter.

## 2011-03-30 NOTE — Progress Notes (Signed)
Clinical Summary [name] is a 77 y.o.female presenting for followup. She was seen in late May with history of palpitations. 2-D echocardiogram and cardiac monitor were recommended.  She was not willing to wear the event recorder, and a 48-hour Holter monitor was arranged.  She presents with her daughter today, states only occasional sense of brief palpitations, no dizziness or syncope. She did wear the 48-hour Holter monitor, however the report and strips are not available for me to review today with her. I explained to the patient and her daughter that we would call them with the results when they were available to me. Otherwise we discussed her 2-D echocardiogram, and at this point will plan observation unless her symptoms progress, or if her monitor uncovered any dysrhythmias that need further attention.   Allergies  Allergen Reactions  . Ciprofloxacin   . Flagyl (Metronidazole Hcl)     Current outpatient prescriptions:acetaminophen (TYLENOL) 500 MG tablet, Take 500 mg by mouth every 6 (six) hours as needed.  , Disp: , Rfl: ;  aspirin 325 MG tablet, Take 325 mg by mouth daily.  , Disp: , Rfl: ;  calcium carbonate (OS-CAL) 600 MG TABS, Take 600 mg by mouth daily.  , Disp: , Rfl: ;  Glucosamine-Chondroitin (OSTEO BI-FLEX REGULAR STRENGTH PO), Take 1 tablet by mouth daily.  , Disp: , Rfl:  Multiple Vitamins-Minerals (MULTIVITAMIN WITH MINERALS) tablet, Take 1 tablet by mouth daily.  , Disp: , Rfl: ;  Naphazoline-Polyethyl Glycol (EYE DROPS) 0.012-0.2 % SOLN, Apply 1 drop to eye daily.  , Disp: , Rfl: ;  Potassium Gluconate 595 MG CAPS, Take 1 capsule by mouth daily.  , Disp: , Rfl: ;  Probiotic Product (FLORA-Q PO), Take 1 tablet by mouth daily.  , Disp: , Rfl:   Past Medical History  Diagnosis Date  . Clostridium difficile colitis   . Glaucoma   . Diverticulosis   . Diverticulitis   . Recurrent UTI     Social History [name] reports that she has never smoked. She has never used  smokeless tobacco. [name] reports that she does not drink alcohol.  Review of Systems Otherwise reviewed and negative.  Physical Examination Filed Vitals:   03/30/11 1517  BP: 149/81  Pulse: 83   Thin elderly woman in no acute distress.  HEENT: Conjunctiva and lids normal, oropharynx with moist mucosa.  Neck: Supple, no elevated JVP or carotid bruits, no thyromegaly.  Lungs: Clear to auscultation, nonlabored.  Cardiac: Regular rate and rhythm with soft systolic murmur at the base, no ectopy, no S3.  Abdomen: Soft, nontender, bowel sounds present.  Skin: Warm and dry.  Musculoskeletal: Mild kyphosis.  Extremities: No pitting edema. Mild venous stasis.  Neuropsychiatric: Alert and oriented x3, affect appropriate.    Studies Echocardiogram 03/05/2011: - Left ventricle: The cavity size was normal. There was mild     concentric hypertrophy. Systolic function was normal. The     estimated ejection fraction was in the range of 60% to 65%. Wall     motion was normal; there were no regional wall motion     abnormalities.   - Mitral valve: Mild prolapse cannot be excluded.   - Atrial septum: No defect or patent foramen ovale was identified.   - Pulmonary arteries: PA peak pressure: 40mm Hg (S).  Problem List and Plan

## 2011-03-30 NOTE — Assessment & Plan Note (Signed)
Not progressive, LVEF normal by echocardiogram. Will review Holter monitor results when they are available and can let the patient know at that time. If no significant arrhythmias were uncovered, would recommend observation.

## 2011-04-18 NOTE — Procedures (Signed)
[name], [name]               ACCOUNT NO.:  [id number]  MEDICAL RECORD NO.:  [id number]  LOCATION:  CARDIOPU                      FACILITY:  [facility]  PHYSICIAN:  Gerrit Friends. Dietrich Pates, MD, FACCDATE OF BIRTH:  07-Jan-1926  DATE OF PROCEDURE:  03/13/2011 DATE OF DISCHARGE:  03/13/2011                               HOLTER MONITOR   REFERRING PHYSICIAN:  Jonelle Sidle, MD  CLINICAL DATA:  An 77 year old woman with palpitations. 1. Continuous electrocardiographic recording was maintained for 48     hours during which the predominant rhythm was normal sinus with     some sinus tachycardia reaching a peak of 121 beats per minute,     minimal sinus bradycardia with rates only down to the high 50s and     sinus arrhythmia with 1 or 2-second pauses recorded. 2. Rare premature ventricular depolarizations were identified,     occurring at an average rate of once every 2 hours. 3. Fairly frequent premature supraventricular ectopics occurred at an     average rate of 74 per hour.  There were a number of short runs of     supraventricular tachycardia at heart rates of approximately 135-     140 bpm, the longest of which lasted 12 beats. 4. No significant ST-segment elevation or depression was identified. 5. A complete diary of activity was returned noting palpitations on     three occasions, once with a 6-beat run of supraventricular     tachycardia at a rate of 140, once with frequent PACs, and once     with uncertain rhythm; however, a short run of SVT occurred in     proximity to that symptomatic spell.  On at least two other     occasions, a short run of SVT was not apparently identified by the     patient.  IMPRESSION:  Abnormal continuous electrocardiographic recording due to the presence of short runs of supraventricular tachycardia and fairly frequent premature atrial contractions.  The patient appeared capable of identifying at least some of these arrhythmias, sensing numbness  with palpitations.  Other findings as noted.     Gerrit Friends. Dietrich Pates, MD, [facility]     RMR/MEDQ  D:  03/30/2011  T:  03/31/2011  Job:  [id number]

## 2011-05-08 ENCOUNTER — Encounter (INDEPENDENT_AMBULATORY_CARE_PROVIDER_SITE_OTHER): Payer: Self-pay

## 2011-05-09 ENCOUNTER — Telehealth (INDEPENDENT_AMBULATORY_CARE_PROVIDER_SITE_OTHER): Payer: Self-pay | Admitting: *Deleted

## 2011-05-09 DIAGNOSIS — K5792 Diverticulitis of intestine, part unspecified, without perforation or abscess without bleeding: Secondary | ICD-10-CM

## 2011-05-09 DIAGNOSIS — IMO0001 Reserved for inherently not codable concepts without codable children: Secondary | ICD-10-CM

## 2011-05-09 NOTE — Telephone Encounter (Signed)
Toniann Fail called about her Mother , [name]. She states that last week she had a spell with her Diverticultis. This week she is doing well. Bowel Movements are normal , she is eating well , and is having no pain. The concern that Toniann Fail has is that her Mother has been sweating since the flare. [name] has an appointment on  05-15-11 with Nurse Practioner, Dorene Ar. Toniann Fail was just wanting to make sure if there was something that could be done prior to the office visit that may help. Per Dr. Lionel December may order a CBC/D . This has been done and order faxed , and Toniann Fail made aware.

## 2011-05-11 LAB — CBC WITH DIFFERENTIAL/PLATELET
Basophils Absolute: 0 10*3/uL (ref 0.0–0.1)
Basophils Relative: 0 % (ref 0–1)
Eosinophils Absolute: 0.1 10*3/uL (ref 0.0–0.7)
Eosinophils Relative: 1 % (ref 0–5)
MCH: 28.1 pg (ref 26.0–34.0)
MCHC: 31.5 g/dL (ref 30.0–36.0)
MCV: 89.4 fL (ref 78.0–100.0)
Neutrophils Relative %: 86 % — ABNORMAL HIGH (ref 43–77)
Platelets: 355 10*3/uL (ref 150–400)
RDW: 14.3 % (ref 11.5–15.5)

## 2011-05-15 ENCOUNTER — Ambulatory Visit (INDEPENDENT_AMBULATORY_CARE_PROVIDER_SITE_OTHER): Payer: Medicare Other | Admitting: Internal Medicine

## 2011-05-15 ENCOUNTER — Encounter (INDEPENDENT_AMBULATORY_CARE_PROVIDER_SITE_OTHER): Payer: Self-pay | Admitting: Internal Medicine

## 2011-05-15 DIAGNOSIS — R1032 Left lower quadrant pain: Secondary | ICD-10-CM

## 2011-05-15 NOTE — Progress Notes (Signed)
Chief complaint: Left lower abdominal pain, possible diverticulitis  HPI:  [name] is an 77 year old female presenting with c/o   left lower abdominal pain since the first of August.  She actually had symptoms S for 2 weeks. She underwent a CBC last week.  She says she has progressively gotten better.  There was no mucous or blood. Her stools were not formed the first of August..  Stools would break apart. Her appetite is good. No weight loss.  The first of August she was on a bland diet x 1 week per her; daughter who is present in the room.  She is now eating solids foods.  She does have a hx of diverticulitis. She feels much better.  She mowed her yard last week. Her appetite is back. There has been no fever.  In the past she has had problems with recurrent C diff  yrs ago.         Component Results       Component  Value  Range & Units  Status    WBC  11.1 (H)  4.0 - 10.5 K/uL  Final    RBC  4.05  3.87 - 5.11 MIL/uL  Final    Hemoglobin  11.4 (L)  12.0 - 15.0 g/dL  Final    HCT  16.1  09.6 - 46.0 %  Final    MCV  89.4  78.0 - 100.0 fL  Final    MCH  28.1  26.0 - 34.0 pg  Final    MCHC  31.5  30.0 - 36.0 g/dL  Final    RDW  04.5  40.9 - 15.5 %  Final    Platelets  355  150 - 400 K/uL  Final    Neutrophils Relative  86 (H)  43 - 77 %  Final    Neutro Abs  9.5 (H)  1.7 - 7.7 K/uL  Final    Lymphocytes Relative  6 (L)  12 - 46 %  Final    Lymphs Abs  0.7  0.7 - 4.0 K/uL  Final    Monocytes Relative  7  3 - 12 %  Final    Monocytes Absolute  0.8  0.1 - 1.0 K/uL  Final    Eosinophils Relative  1  0 - 5 %  Final    Eosinophils Absolute  0.1  0.0 - 0.7 K/uL  Final    Basophils Relative  0  0 - 1 %  Final    Basophils Absolute  0.0  0.0 - 0.1 K/uL  Final    Smear Review          ROS:  See hpi  Objective:Alert and oriented. Skin warm and dry. Oral mucosa is moist. Natural teeth in poor condition. Sclera anicteric, conjunctivae is pink. Thyroid not enlarged. No cervical  lymphadenopathy. Lungs clear. Heart regular rate and rhythm.  Abdomen is soft. Bowel sounds are positive. No hepatomegaly. No abdominal masses felt. Very slight left lower abdominal  tenderness.  No edema to lower extremities. Patient is alert and oriented.    Assessment: Left lower quadrant pain, possible diverticulitis, which has now resolved. 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        Plan/Recommendation:  Continue present medications.   Continue fiber supplements.  If any problems, she will  call office.

## 2012-01-15 ENCOUNTER — Encounter (INDEPENDENT_AMBULATORY_CARE_PROVIDER_SITE_OTHER): Payer: Self-pay | Admitting: *Deleted

## 2012-02-19 ENCOUNTER — Encounter (INDEPENDENT_AMBULATORY_CARE_PROVIDER_SITE_OTHER): Payer: Self-pay | Admitting: Internal Medicine

## 2012-02-19 ENCOUNTER — Ambulatory Visit (INDEPENDENT_AMBULATORY_CARE_PROVIDER_SITE_OTHER): Payer: Medicare Other | Admitting: Internal Medicine

## 2012-02-19 VITALS — BP 124/70 | HR 74 | Temp 97.2°F | Resp 20 | Ht 66.0 in | Wt 115.0 lb

## 2012-02-19 DIAGNOSIS — K589 Irritable bowel syndrome without diarrhea: Secondary | ICD-10-CM | POA: Insufficient documentation

## 2012-02-19 DIAGNOSIS — Z8719 Personal history of other diseases of the digestive system: Secondary | ICD-10-CM | POA: Insufficient documentation

## 2012-02-19 NOTE — Patient Instructions (Signed)
No changes made in medications. 

## 2012-02-19 NOTE — Progress Notes (Signed)
Presenting complaint;  Followup for bowel problems.  Subjective:  Patient is 78 year old Caucasian female who is here for yearly visit. Prior to her last visit she had an episode of abdominal pain but none since then. She remains with good appetite. Her weight has been stable. She generally has 2 formed soft stools daily. She feels she has evacuated and fairly well. She denies melena or rectal bleeding. She is wondering which antibiotic should she use if the need arises.   Current Medications: Current Outpatient Prescriptions  Medication Sig Dispense Refill  . acetaminophen (TYLENOL) 500 MG tablet Take 500 mg by mouth every 6 (six) hours as needed.        Marland Kitchen aspirin 325 MG tablet Take 325 mg by mouth daily.        . calcium carbonate (OS-CAL) 600 MG TABS Take 600 mg by mouth daily.        . Glucosamine-Chondroitin (OSTEO BI-FLEX REGULAR STRENGTH PO) Take 1 tablet by mouth daily.        Marland Kitchen latanoprost (XALATAN) 0.005 % ophthalmic solution at bedtime.        . Multiple Vitamins-Minerals (MULTIVITAMIN WITH MINERALS) tablet Take 1 tablet by mouth daily.        . Naphazoline-Polyethyl Glycol (EYE DROPS) 0.012-0.2 % SOLN Apply 1 drop to eye daily.       . polyethylene glycol (MIRALAX / GLYCOLAX) packet Take by mouth as needed.       . Potassium Gluconate 595 MG CAPS Take 1 capsule by mouth daily.        . Probiotic Product (FLORA-Q PO) Take 1 tablet by mouth daily.        . psyllium (REGULOID) 0.52 G capsule Take by mouth daily.           Objective: Blood pressure 124/70, pulse 74, temperature 97.2 F (36.2 C), temperature source Oral, resp. rate 20, height 5\' 6"  (1.676 m), weight 115 lb (52.164 kg). Patient appears comfortable in chair. She is able to move from chair to examination table with help of cane. Conjunctiva is pink. Sclera is nonicteric Oropharyngeal mucosa is normal. No neck masses or thyromegaly noted. Lungs are clear to auscultation. Abdomen is symmetrical. Bowel sounds are  normal. Abdomen is very soft with thin abdominal wall but no masses tenderness organomegaly noted No LE edema or clubbing noted.   Assessment:  IBS. She is doing very well with dietary supplement. She has history of recalcitrant C. difficile colitis but hasn't required treatment for about 2 years. She also has history of diverticulitis treatment of which led to C. difficile colitis.   Plan:  Continue present diet and Metamucil and probiotic daily. If she ever needs an antibiotic for UTI would recommend Macrodantin seen on her Bactrim. Office visit in one year unless has problems

## 2012-10-08 ENCOUNTER — Ambulatory Visit: Payer: Medicare Other | Admitting: Orthopedic Surgery

## 2012-12-24 ENCOUNTER — Ambulatory Visit (INDEPENDENT_AMBULATORY_CARE_PROVIDER_SITE_OTHER): Payer: Medicare Other | Admitting: Orthopedic Surgery

## 2012-12-24 ENCOUNTER — Encounter: Payer: Self-pay | Admitting: Orthopedic Surgery

## 2012-12-24 VITALS — BP 132/70 | Ht 66.0 in | Wt 107.0 lb

## 2012-12-24 DIAGNOSIS — M67919 Unspecified disorder of synovium and tendon, unspecified shoulder: Secondary | ICD-10-CM

## 2012-12-24 DIAGNOSIS — M19011 Primary osteoarthritis, right shoulder: Secondary | ICD-10-CM | POA: Insufficient documentation

## 2012-12-24 DIAGNOSIS — M19019 Primary osteoarthritis, unspecified shoulder: Secondary | ICD-10-CM

## 2012-12-24 DIAGNOSIS — M75101 Unspecified rotator cuff tear or rupture of right shoulder, not specified as traumatic: Secondary | ICD-10-CM | POA: Insufficient documentation

## 2012-12-24 NOTE — Progress Notes (Signed)
Patient ID: [name], female   DOB: Apr 19, 1926, 79 y.o.   MRN: [id number] Chief Complaint  Patient presents with  . Shoulder Pain    Right shoulder pain    History the patient is now 79 years old shoulder right bipolar hip replacement for fracture many many years ago presents with right shoulder pain we did an x-ray back in 2011 she had glenohumeral arthritis and impingement  She presents now with gradual onset of recurrent right shoulder pain over the last 2 years worsening in the last few months associated with 7/10 intermittent pain which is worse with attempts at forward elevation she denies trauma  Review of systems cardiovascular heart murmurs musculoskeletal joint stiffness. No numbness no tingling some mild unsteadiness in her gait recently related to age.  Allergic to Cipro and Flagyl  History diverticulitis and glaucoma arthritis status post hip replacement cataract surgery  Pharmacy lanes  Physician Dr. Renard Matter  Medications multivitamin, Osteo Bi-Flex, Tylenol, aspirin, potassium, calcium, probiotic and eyedrops  Family history negative  Social history widowed  Denies smoking or drinking  BP 132/70  Ht 5\' 6"  (1.676 m)  Wt 107 lb (48.535 kg)  BMI 17.28 kg/m2   General appearance is normal  The patient is alert and oriented x3  The patient's mood and affect are normal  Gait assessment: Supported by a cane, slow gait, decreased stride length decreased flexion during gait  The cardiovascular exam reveals normal pulses and temperature without edema or  Swelling in the upper extremities  The lymphatic system is negative for palpable lymph nodes right axilla right supraclavicular area left axilla left supraclavicular area  The sensory exam is normal.  There are no pathologic reflexes.     Exam of the left shoulder revealed full range of motion, normal muscle tone no swelling no tenderness no joint subluxations elbow wrist hand or shoulder Examination  of the right shoulder was painful for elevation swelling crepitance on range of motion passive range of motion normal but painful between 90 and 150 shoulder stability confirmed strength normal skin intact   Last x-ray shows glenohumeral arthritis no recent trauma  Diagnosis impingement syndrome with glenohumeral arthritis  Recommend subacromial injection which the patient wanted  Followup as needed  Shoulder Injection Procedure Note   Pre-operative Diagnosis: right  RC Syndrome  Post-operative Diagnosis: same  Indications: pain   Anesthesia: ethyl chloride   Procedure Details   Verbal consent was obtained for the procedure. The shoulder was prepped withalcohol and the skin was anesthetized. A 20 gauge needle was advanced into the subacromial space through posterior approach without difficulty  The space was then injected with 3 ml 1% lidocaine and 1 ml of depomedrol. The injection site was cleansed with isopropyl alcohol and a dressing was applied.  Complications:  None; patient tolerated the procedure well.

## 2013-02-16 ENCOUNTER — Encounter (INDEPENDENT_AMBULATORY_CARE_PROVIDER_SITE_OTHER): Payer: Self-pay | Admitting: Internal Medicine

## 2013-02-16 ENCOUNTER — Ambulatory Visit (INDEPENDENT_AMBULATORY_CARE_PROVIDER_SITE_OTHER): Payer: Medicare Other | Admitting: Internal Medicine

## 2013-02-16 VITALS — BP 120/72 | HR 74 | Temp 97.0°F | Resp 18 | Ht 66.0 in | Wt 105.9 lb

## 2013-02-16 DIAGNOSIS — R634 Abnormal weight loss: Secondary | ICD-10-CM

## 2013-02-16 LAB — COMPREHENSIVE METABOLIC PANEL
ALT: 8 U/L (ref 0–35)
AST: 17 U/L (ref 0–37)
Albumin: 4.6 g/dL (ref 3.5–5.2)
Alkaline Phosphatase: 58 U/L (ref 39–117)
BUN: 12 mg/dL (ref 6–23)
Potassium: 4.3 mEq/L (ref 3.5–5.3)
Sodium: 133 mEq/L — ABNORMAL LOW (ref 135–145)

## 2013-02-16 LAB — CBC
HCT: 39.5 % (ref 36.0–46.0)
MCHC: 34.7 g/dL (ref 30.0–36.0)
Platelets: 249 10*3/uL (ref 150–400)
RDW: 14.4 % (ref 11.5–15.5)
WBC: 5.5 10*3/uL (ref 4.0–10.5)

## 2013-02-16 NOTE — Patient Instructions (Signed)
Try to eat snacks between meals. Exercise daily as we discussed. Physician will contact you with results of blood work. Weights check in 8 weeks

## 2013-02-16 NOTE — Progress Notes (Signed)
Presenting complaint;  Weight loss.  Subjective:  Patient is an 79 year old Caucasian female who is here for scheduled visit accompanied by her daughter Toniann Fail. She has history of diverticulitis as well as C. difficile colitis which took several months to treat. She has not had any bowel problems since her last visit. Her daughter states that she's not had a good appetite. She eats very little each time. She has lost 10 pounds since her last visit. Patient denies nausea vomiting fever chills or night sweats. She also denies abdominal pain melena or rectal bleeding. Her bowels move regularly as long as she takes MiraLax. She also denies dysuria hematuria or cough. She did walk to mailbox other day. She did not do so during winter months.  He says she gets tired quickly. She does not have dyspnea at rest or on exertion. She does not remember the last time she saw Dr. Renard Matter.  Current Medications: Current Outpatient Prescriptions  Medication Sig Dispense Refill  . acetaminophen (TYLENOL) 500 MG tablet Take 500 mg by mouth every 6 (six) hours as needed.        Marland Kitchen aspirin 325 MG tablet Take 325 mg by mouth daily.        . calcium carbonate (OS-CAL) 600 MG TABS Take 600 mg by mouth daily.        . Glucosamine-Chondroitin (OSTEO BI-FLEX REGULAR STRENGTH PO) Take 1 tablet by mouth daily.        Marland Kitchen latanoprost (XALATAN) 0.005 % ophthalmic solution at bedtime.        . Multiple Vitamins-Minerals (MULTIVITAMIN WITH MINERALS) tablet Take 1 tablet by mouth daily.        . Naphazoline-Polyethyl Glycol (EYE DROPS) 0.012-0.2 % SOLN Apply 1 drop to eye daily.       . polyethylene glycol (MIRALAX / GLYCOLAX) packet Take by mouth as needed.       . Potassium Gluconate 595 MG CAPS Take 1 capsule by mouth daily.        . Probiotic Product (FLORA-Q PO) Take 1 tablet by mouth daily.        . psyllium (REGULOID) 0.52 G capsule Take by mouth daily.         No current facility-administered medications for this visit.      Objective: Blood pressure 120/72, pulse 74, temperature 97 F (36.1 C), temperature source Oral, resp. rate 18, height 5\' 6"  (1.676 m), weight 105 lb 14.4 oz (48.036 kg). Patient is alert and in no acute distress. She is using cane to move around(at home she uses a Charon). Conjunctiva is pink. Sclera is nonicteric Oropharyngeal mucosa is normal. No neck masses or thyromegaly noted. Cardiac exam with regular rhythm normal S1 and S2. No murmur or gallop noted. Lungs are clear to auscultation. Abdomen symmetrical. Bowel sounds are normal. Abdomen is soft and nontender without organomegaly or masses.  No LE edema or clubbing noted.    Assessment:  #1. Weight loss. She has lost close to 10 pounds last one year. I do not believe this weight loss can be explained on the basis of bone loss. She does not have a lot of symptoms. We'll start her workup by doing routine blood work and go from there. #2. History of C. difficile colitis. She has not required antibiotics in 3 years.    Plan:  Patient will go to the lab for CBC, comprehensive panel and TSH level. Patient advised to exercise daily. She can do this while sitting or supine and do it  2-3 times a day. Patient encouraged to eat snacks between meals. Weight check in 8 weeks.  Office visit in 6 months.

## 2013-03-25 ENCOUNTER — Telehealth (INDEPENDENT_AMBULATORY_CARE_PROVIDER_SITE_OTHER): Payer: Self-pay | Admitting: *Deleted

## 2013-03-25 NOTE — Telephone Encounter (Signed)
Toniann Fail, patient's daughter, called and said her mother's weight is 107 lbs. Mother may get agitated if she brings her in. Hope this will work for her weight check. Wendy's return phone number is [phone]. The best time to reach her would be between 11:30 to 3 pm.

## 2013-03-26 NOTE — Telephone Encounter (Signed)
Message left at home. She has gained 2 pounds since her last visit.

## 2013-06-16 ENCOUNTER — Encounter (INDEPENDENT_AMBULATORY_CARE_PROVIDER_SITE_OTHER): Payer: Self-pay | Admitting: *Deleted

## 2013-07-14 ENCOUNTER — Encounter (INDEPENDENT_AMBULATORY_CARE_PROVIDER_SITE_OTHER): Payer: Self-pay | Admitting: Internal Medicine

## 2013-07-14 ENCOUNTER — Ambulatory Visit (INDEPENDENT_AMBULATORY_CARE_PROVIDER_SITE_OTHER): Payer: Medicare Other | Admitting: Internal Medicine

## 2013-07-14 VITALS — BP 132/60 | HR 76 | Temp 98.3°F | Ht 66.0 in | Wt 103.0 lb

## 2013-07-14 DIAGNOSIS — R634 Abnormal weight loss: Secondary | ICD-10-CM

## 2013-07-14 DIAGNOSIS — K5900 Constipation, unspecified: Secondary | ICD-10-CM | POA: Insufficient documentation

## 2013-07-14 NOTE — Progress Notes (Signed)
Subjective:     Patient ID: [name], female   DOB: 1926-08-01, 78 y.o.   MRN: [id number]  HPI Here today for f/u.  She has lost 2 pounds since her last visit. She does have some problems with constipation. She is going every other day and takes Miralax on a prn basis. She is taking Metamucil but probably not every day. Her daughter is trying to get her to drink more water. No melena or bright red rectal bleeding. No abdominal pain. Her daughter states her mother's appetite is very good.  Hx of C-difficile and diverticulitis.  Recently moved next to her daughter. Daughter cooks all her meals for her. She walks with a cane. Daughter states that he mother really is not exercising since the move. She does walk her mother on the deck when possible.  CBC    Component Value Date/Time   WBC 5.5 02/16/2013 0940   RBC 4.60 02/16/2013 0940   HGB 13.7 02/16/2013 0940   HCT 39.5 02/16/2013 0940   PLT 249 02/16/2013 0940   MCV 85.9 02/16/2013 0940   MCH 29.8 02/16/2013 0940   MCHC 34.7 02/16/2013 0940   RDW 14.4 02/16/2013 0940   LYMPHSABS 0.7 05/09/2011 1659   MONOABS 0.8 05/09/2011 1659   EOSABS 0.1 05/09/2011 1659   BASOSABS 0.0 05/09/2011 1659   BMET    Component Value Date/Time   NA 133* 02/16/2013 0940   NA 132* 09/27/2010   K 4.3 02/16/2013 0940   CL 93* 02/16/2013 0940   CO2 30 02/16/2013 0940   GLUCOSE 100* 02/16/2013 0940   BUN 12 02/16/2013 0940   CREATININE 0.67 02/16/2013 0940   CREATININE 0.8 12/31/2008 0810   CALCIUM 9.7 02/16/2013 0940   02/16/2013 TSH 1.212   Review of Systems  See hpi Current Outpatient Prescriptions  Medication Sig Dispense Refill  . acetaminophen (TYLENOL) 500 MG tablet Take 500 mg by mouth every 6 (six) hours as needed.        . Glucosamine-Chondroitin (OSTEO BI-FLEX REGULAR STRENGTH PO) Take 1 tablet by mouth daily.        Marland Kitchen latanoprost (XALATAN) 0.005 % ophthalmic solution at bedtime.        . Multiple Vitamins-Minerals (MULTIVITAMIN WITH MINERALS) tablet Take 1  tablet by mouth daily.        . Naphazoline-Polyethyl Glycol (EYE DROPS) 0.012-0.2 % SOLN Apply 1 drop to eye daily.       . polyethylene glycol (MIRALAX / GLYCOLAX) packet Take by mouth as needed.       . Potassium Gluconate 595 MG CAPS Take 1 capsule by mouth daily.        . Probiotic Product (FLORA-Q PO) Take 1 tablet by mouth daily.        . psyllium (REGULOID) 0.52 G capsule Take by mouth daily.        Marland Kitchen aspirin 325 MG tablet Take 325 mg by mouth daily.         No current facility-administered medications for this visit.   Past Medical History  Diagnosis Date  . Clostridium difficile colitis     dating back to 2004  . Glaucoma   . Diverticulosis   . Diverticulitis   . Recurrent UTI   . Chronic constipation   . Nausea    Past Surgical History  Procedure Laterality Date  . Orif right hip  2004    Dr.Harrison  . Bilateral eyelid surgery    . Colonoscopy  10/30/04  NUR   Allergies  Allergen Reactions  . Ciprofloxacin   . Flagyl [Metronidazole Hcl]          Objective:   Physical Exam  Filed Vitals:   07/14/13 1044  BP: 132/60  Pulse: 76  Temp: 98.3 F (36.8 C)  Height: 5\' 6"  (1.676 m)  Weight: 103 lb (46.72 kg)   Alert and oriented. Skin warm and dry. Oral mucosa is moist.   . Sclera anicteric, conjunctivae is pink. Thyroid not enlarged. No cervical lymphadenopathy. Lungs clear. Heart regular rate and rhythm.  Abdomen is soft. Bowel sounds are positive. No hepatomegaly. No abdominal masses felt. No tenderness.  No edema to lower extremities.       Assessment:   constipation: She is having a BM every other day and takes Miralax on a prn basis. She also takes Metamucil daily. Weight loss. She has lost 2 pounds since her last visit in May. Her daughter states her mother appetite is very good.     Plan:   Introduce milk back into patient's diet. Stool softener one a day.  CBC,CMET in 6 months.

## 2013-07-14 NOTE — Patient Instructions (Signed)
CBC, CMET in 6 months. OV in 6 months

## 2013-07-15 ENCOUNTER — Telehealth (INDEPENDENT_AMBULATORY_CARE_PROVIDER_SITE_OTHER): Payer: Self-pay | Admitting: *Deleted

## 2013-07-15 DIAGNOSIS — R634 Abnormal weight loss: Secondary | ICD-10-CM

## 2013-07-15 NOTE — Telephone Encounter (Signed)
.  Per Terri Setzer,NP patient to have lab work done in 6 months. 

## 2013-08-10 ENCOUNTER — Ambulatory Visit (INDEPENDENT_AMBULATORY_CARE_PROVIDER_SITE_OTHER): Payer: Medicare Other | Admitting: Internal Medicine

## 2013-12-23 ENCOUNTER — Encounter (INDEPENDENT_AMBULATORY_CARE_PROVIDER_SITE_OTHER): Payer: Self-pay | Admitting: *Deleted

## 2013-12-23 ENCOUNTER — Other Ambulatory Visit (INDEPENDENT_AMBULATORY_CARE_PROVIDER_SITE_OTHER): Payer: Self-pay | Admitting: *Deleted

## 2013-12-23 DIAGNOSIS — R634 Abnormal weight loss: Secondary | ICD-10-CM

## 2014-02-15 ENCOUNTER — Encounter (INDEPENDENT_AMBULATORY_CARE_PROVIDER_SITE_OTHER): Payer: Self-pay | Admitting: Internal Medicine

## 2014-02-15 ENCOUNTER — Ambulatory Visit (INDEPENDENT_AMBULATORY_CARE_PROVIDER_SITE_OTHER): Payer: Medicare Other | Admitting: Internal Medicine

## 2014-02-15 VITALS — BP 140/80 | HR 76 | Temp 96.8°F | Resp 18 | Ht 66.0 in | Wt 110.7 lb

## 2014-02-15 DIAGNOSIS — K589 Irritable bowel syndrome without diarrhea: Secondary | ICD-10-CM

## 2014-02-15 NOTE — Progress Notes (Signed)
Presenting complaint;  Followup for bowel problems and weight loss.  Subjective:  Patient is 80 year old Caucasian female who presents for scheduled visit accompanied by her daughter [name]. She was last seen in October 2014. Patient states she's feeling quite well. She has occasional urgency and accident but on most days she has formed stools. She has not been constipated since her last visit. She is taking probiotic and fiber supplement 3-4 times a week. She denies abdominal pain melena or rectal bleeding. She says her appetite is normal. She snacks all day long but does not eat regular meals. She has gained 5 pounds since her last visit. She lives alone next to her daughter's she tries to walk inside the house and also works in her yard.      Current Medications: Outpatient Encounter Prescriptions as of 02/15/2014  Medication Sig  . acetaminophen (TYLENOL) 500 MG tablet Take 500 mg by mouth every 6 (six) hours as needed.    Marland Kitchen aspirin 325 MG tablet Take 325 mg by mouth daily.    . Glucosamine-Chondroitin (OSTEO BI-FLEX REGULAR STRENGTH PO) Take 1 tablet by mouth daily.    Marland Kitchen latanoprost (XALATAN) 0.005 % ophthalmic solution at bedtime.    . Multiple Vitamins-Minerals (MULTIVITAMIN WITH MINERALS) tablet Take 1 tablet by mouth daily.    . Naphazoline-Polyethyl Glycol (EYE DROPS) 0.012-0.2 % SOLN Apply 1 drop to eye daily.   . Potassium Gluconate 595 MG CAPS Take 1 capsule by mouth daily.    . Probiotic Product (FLORA-Q PO) Take 1 tablet by mouth daily.    . psyllium (REGULOID) 0.52 G capsule Take by mouth daily.    . [DISCONTINUED] polyethylene glycol (MIRALAX / GLYCOLAX) packet Take by mouth as needed.      Objective: Blood pressure 140/80, pulse 76, temperature 96.8 F (36 C), temperature source Oral, resp. rate 18, height 5\' 6"  (1.676 m), weight 110 lb 11.2 oz (50.213 kg). Patient is alert and in no acute distress. Conjunctiva is pink. Sclera is nonicteric Oropharyngeal mucosa is  normal. No neck masses or thyromegaly noted. Cardiac exam with regular rhythm normal S1 and S2. No murmur or gallop noted. Lungs are clear to auscultation. Abdomen symmetrical soft and nontender without organomegaly or masses.  No LE edema or clubbing noted.  Labs/studies Results: She had CBC TSH and comprehensive chemistry panel on 02/16/2013 lab studies were normal.    Assessment:  #1. Irritable bowel syndrome. She is going better with fiber supplement and probiotic. She needs to take fiber supplement every day rather than when necessary. #2. Weight loss as her worse. She has gained 5 pounds which is reassuring. #3. Remote history of recalcitrant C. difficile colitis.  Plan:  Take fiber supplement daily. Office visit in one year.

## 2014-02-15 NOTE — Patient Instructions (Addendum)
Take fiber supplements daily or every other day.

## 2014-03-31 ENCOUNTER — Encounter: Payer: Self-pay | Admitting: *Deleted

## 2014-04-07 ENCOUNTER — Encounter: Payer: Self-pay | Admitting: Obstetrics & Gynecology

## 2014-05-03 ENCOUNTER — Encounter: Payer: Self-pay | Admitting: Obstetrics & Gynecology

## 2014-05-03 ENCOUNTER — Ambulatory Visit (INDEPENDENT_AMBULATORY_CARE_PROVIDER_SITE_OTHER): Payer: Medicare Other | Admitting: Obstetrics & Gynecology

## 2014-05-03 VITALS — BP 158/78 | Ht 66.0 in | Wt 117.0 lb

## 2014-05-03 DIAGNOSIS — D071 Carcinoma in situ of vulva: Secondary | ICD-10-CM | POA: Insufficient documentation

## 2014-05-03 DIAGNOSIS — N9089 Other specified noninflammatory disorders of vulva and perineum: Secondary | ICD-10-CM | POA: Insufficient documentation

## 2014-05-03 NOTE — Addendum Note (Signed)
Addended by: Doyne Keel on: 05/03/2014 11:01 AM   Modules accepted: Orders

## 2014-05-03 NOTE — Progress Notes (Signed)
Patient ID: [name], female   DOB: [birth year], 80 y.o.   MRN: [id number] No chief complaint on file.   HPI Referred from dr Emilee Hero due to vulvar lesion, pt states it has been there "a long time"  ROS No burning with urination, frequency or urgency No nausea, vomiting or diarrhea Nor fever chills or other constitutional symptoms   Blood pressure 158/78, height 5\' 6"  (1.676 m), weight 117 lb (53.071 kg).  EXAM Abdomen:      soft, nontender Vulva:            Vulvar lesion on left in area of clitoris Vagina:          normal mucosa, no discharge Cervix:            Uterus:           Adnexa:          Rectal:            Hemoccult:                              Vulvar biopsy done, 4 mm punch, 1 suture placed Assessment/Plan:  Left vulvar lesion: biopsied LSA Vs vulvar dysplasi/carcinoma

## 2014-05-05 ENCOUNTER — Telehealth: Payer: Self-pay | Admitting: Obstetrics & Gynecology

## 2014-05-05 NOTE — Telephone Encounter (Signed)
Pt states she did not get any instructions on caring for the biopsy area on her mother, she bathed her later that evening and now area looks "icky" and wants to know how to care for it.  I informed her that Dr. Elonda Husky is out of the office this morning but I will ask him when he comes in this afternoon, in the mean time can dab at area with warm soapy water, do not rub and make sure it is dry afterwards and I will call her back this afternoon.

## 2014-05-10 ENCOUNTER — Ambulatory Visit (INDEPENDENT_AMBULATORY_CARE_PROVIDER_SITE_OTHER): Payer: Medicare Other | Admitting: Obstetrics & Gynecology

## 2014-05-10 ENCOUNTER — Encounter: Payer: Self-pay | Admitting: Obstetrics & Gynecology

## 2014-05-10 DIAGNOSIS — C519 Malignant neoplasm of vulva, unspecified: Secondary | ICD-10-CM

## 2014-05-10 NOTE — Progress Notes (Signed)
Patient ID: [name], female   DOB: July 06, 1926, 80 y.o.   MRN: [id number] Daughter is here for pathology review:  High Grade Vulvar Dysplasia: large lesion  My concern is that this could be invasive somewhere in the lesion  She will need full surgical excision and pathology evaluation of the specimen and then appropriate post oeprative adjuvant therapy if any needed  Called and made appt with my Gyn oncology colleagues at [facility]:  05/17/2014 @1145   Past Medical History  Diagnosis Date  . Clostridium difficile colitis     dating back to 2004  . Glaucoma   . Diverticulosis   . Diverticulitis   . Recurrent UTI   . Chronic constipation   . Nausea   . Dementia     Past Surgical History  Procedure Laterality Date  . Orif right hip  2004    Dr.Harrison  . Bilateral eyelid surgery    . Colonoscopy  10/30/04    NUR    OB History   Grav Para Term Preterm Abortions TAB SAB Ect Mult Living                  Allergies  Allergen Reactions  . Ciprofloxacin   . Flagyl [Metronidazole Hcl]     History   Social History  . Marital Status: Widowed    Spouse Name: N/A    Number of Children: N/A  . Years of Education: N/A   Occupational History  . Retired    Social History Main Topics  . Smoking status: Never Smoker   . Smokeless tobacco: Never Used  . Alcohol Use: No  . Drug Use: No  . Sexual Activity: Not on file   Other Topics Concern  . Not on file   Social History Narrative  . No narrative on file    Family History  Problem Relation Age of Onset  . Diabetes type II

## 2014-05-17 ENCOUNTER — Ambulatory Visit: Payer: Medicare Other | Attending: Gynecologic Oncology | Admitting: Gynecologic Oncology

## 2014-05-17 ENCOUNTER — Encounter: Payer: Self-pay | Admitting: Gynecologic Oncology

## 2014-05-17 VITALS — BP 154/91 | HR 85 | Temp 98.2°F | Resp 20 | Ht 66.0 in | Wt 109.7 lb

## 2014-05-17 DIAGNOSIS — N9089 Other specified noninflammatory disorders of vulva and perineum: Secondary | ICD-10-CM

## 2014-05-17 DIAGNOSIS — I10 Essential (primary) hypertension: Secondary | ICD-10-CM | POA: Diagnosis not present

## 2014-05-17 DIAGNOSIS — N903 Dysplasia of vulva, unspecified: Secondary | ICD-10-CM

## 2014-05-17 DIAGNOSIS — K5900 Constipation, unspecified: Secondary | ICD-10-CM | POA: Diagnosis not present

## 2014-05-17 DIAGNOSIS — N904 Leukoplakia of vulva: Secondary | ICD-10-CM | POA: Insufficient documentation

## 2014-05-17 DIAGNOSIS — Z79899 Other long term (current) drug therapy: Secondary | ICD-10-CM | POA: Diagnosis not present

## 2014-05-17 DIAGNOSIS — Z7982 Long term (current) use of aspirin: Secondary | ICD-10-CM | POA: Insufficient documentation

## 2014-05-17 DIAGNOSIS — H409 Unspecified glaucoma: Secondary | ICD-10-CM | POA: Diagnosis not present

## 2014-05-17 DIAGNOSIS — F0390 Unspecified dementia without behavioral disturbance: Secondary | ICD-10-CM | POA: Diagnosis not present

## 2014-05-17 DIAGNOSIS — D071 Carcinoma in situ of vulva: Secondary | ICD-10-CM

## 2014-05-17 NOTE — Progress Notes (Signed)
Consult Note: Gyn-Onc  Consult was requested by Dr. Elonda Husky for the evaluation of [name] 80 y.o. female  CC:  Chief Complaint  Patient presents with  . Vulvar Dysplasia    New patient    Assessment/Plan:  [name]  is a 80 y.o.  year old who is seen in consultation at the request of Dr. Elonda Husky for her high-grade vulvar dysplasia.  Karl has a 3 cm lesion on the mons pubis approximating the clitoral hood slightly to the patient's left. There is some degree of nodularity at the anterior most aspect of the lesion which was biopsied, and this is a more concerning area for invasive disease, though the biopsy results are somewhat reassuring. The lesion is very symptomatic. For these 2 reasons I am recommending a partial modified radical vulvectomy (anterior). We will try to achieve at least 1 cm margins around the lesion. She clinically has no evidence of inguinal adenopathy. Given the preoperative benign pathology, intraoperative surgical evaluation of lymph nodes is not indicated at this time, however his discussed with the patient and her daughter the definitive pathology reveals invasive squamous cell carcinoma we would recommend evaluation of the groins ideally surgically if not via PET scan.  I discussed anticipated surgical approach, the possibility for consideration of this procedure being performed under a spinal anesthesia (at the anesthesiologist discretion), and postoperative care. We will admit the patient for overnight stay in the hospital. I expect discharge on postoperative day 1. I discussed perineal care in the postoperative period. Because the lesion is greater than 2 cm away from the urethral meatus have a low suspicion that surgery well complicate urinary voiding. I did discuss risks associated the surgery including wound separation, infection, bleeding, and independent of clitoral sensory function. 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   HPI: [name] is a 80 year old woman who lives alone no in close proximity to her daughter who has a 2 year history of vulvar pruritus. One year ago she was treated by primary care physician for a vulvar candidiasis with a yeast cream. She had only minimal resolution of symptoms with this treatment. She was seen by Dr. Elonda Husky on 05/03/2014 who identified a somewhat nodular lesion on the anterior vulvar and performed a 4 mm punch biopsy. 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 The patient continues to have vulvar pruritus. She resolves her symptom with application of a washcloth against the perineum.   Interval History: she is healing well from her biopsy.  Current Meds:  Outpatient Encounter Prescriptions as of 05/17/2014  Medication Sig  . acetaminophen (TYLENOL) 500 MG tablet Take 500 mg by mouth every 6 (six) hours as needed.    Marland Kitchen aspirin 81 MG tablet Take 81 mg by mouth daily.  . Glucosamine-Chondroitin (OSTEO BI-FLEX REGULAR STRENGTH PO) Take 1 tablet by mouth daily.    Marland Kitchen latanoprost (XALATAN) 0.005 % ophthalmic solution at bedtime.    . Multiple Vitamins-Minerals (MULTIVITAMIN WITH MINERALS) tablet Take 1 tablet by mouth daily.    . Naphazoline-Polyethyl Glycol (EYE DROPS) 0.012-0.2 % SOLN Apply 1 drop to eye daily.   . Potassium Gluconate 595 MG CAPS Take 1 capsule by mouth daily.    Marland Kitchen  Probiotic Product (FLORA-Q PO) Take 1 tablet by mouth daily.    . psyllium (REGULOID) 0.52 G capsule Take by mouth daily.      Allergy:  Allergies  Allergen Reactions  . Flagyl [Metronidazole Hcl]   . Ciprofloxacin Rash    Social Hx:   History   Social History  . Marital Status:  Widowed    Spouse Name: N/A    Number of Children: N/A  . Years of Education: N/A   Occupational History  . Retired    Social History Main Topics  . Smoking status: Never Smoker   . Smokeless tobacco: Never Used  . Alcohol Use: No  . Drug Use: No  . Sexual Activity: Not Currently   Other Topics Concern  . Not on file   Social History Narrative  . No narrative on file    Past Surgical Hx:  Past Surgical History  Procedure Laterality Date  . Orif right hip  2004    Dr.Harrison  . Bilateral eyelid surgery    . Colonoscopy  10/30/04    NUR    Past Medical Hx:  Past Medical History  Diagnosis Date  . Clostridium difficile colitis     dating back to 2004  . Glaucoma   . Diverticulosis   . Diverticulitis   . Recurrent UTI   . Chronic constipation   . Nausea   . Dementia   . Hypertension   . Murmur, cardiac     Past Gynecological History:  SVD. Postmenopausal.Occasional urinary incontinence.  No LMP recorded. Patient is postmenopausal.  Family Hx:  Family History  Problem Relation Age of Onset  . Diabetes type II      Review of Systems:  Constitutional  Feels well,    ENT Normal appearing ears and nares bilaterally Skin/Breast  No rash, sores, jaundice, itching, dryness Cardiovascular  No chest pain, shortness of breath, or edema  Pulmonary  No cough or wheeze.  Gastro Intestinal  No nausea, vomitting, or diarrhoea. No bright red blood per rectum, no abdominal pain, change in bowel movement, or constipation.  Genito Urinary  No frequency, urgency, dysuria, see HPI. Musculo Skeletal  No myalgia, arthralgia, joint swelling or pain  Neurologic  No weakness, numbness, change in gait,  Psychology  No depression, anxiety, insomnia.   Vitals:  Blood pressure 154/91, pulse 85, temperature 98.2 F (36.8 C), resp. rate 20, height 5\' 6"  (1.676 m), weight 109 lb 11.2 oz (49.76 kg).  Physical Exam: WD in NAD Neck  Supple NROM, without any enlargements.   Lymph Node Survey No cervical supraclavicular or inguinal adenopathy Cardiovascular  Pulse normal rate, regularity and rhythm. S1 and S2 normal.  Lungs  Clear to auscultation bilateraly, without wheezes/crackles/rhonchi. Good air movement.  Skin  No rash/lesions/breakdown  Psychiatry  Alert and oriented to person, place, and time  Abdomen  Normoactive bowel sounds, abdomen soft, non-tender and non-obese without evidence of hernia.  Back No CVA tenderness Genito Urinary  Vulva/vagina: 3 cm lesion with leukoplakia and slight nodularity on the proximal midline mons pubis slightly to the patient's left approximating the clitoral hood. Lesion is not fixed to the underlying symphysis pubis. The biopsy site is visualized at the anterior aspect of this lesion is well-healed.  Bladder/urethra:  No lesions or masses, well supported bladder  Vagina: deferred  Cervix: deferred  Uterus: deferred  Adnexa: deferred Rectal  Good tone, no masses no cul de sac nodularity.  Extremities  No bilateral cyanosis, clubbing or edema.  Donaciano Eva, MD   05/17/2014, 4:43 PM

## 2014-05-17 NOTE — Patient Instructions (Signed)
Plan for surgery the morning of Sept 17 with Dr. Denman George.  You will receive a phone call from the hospital to schedule a pre-op appointment.  Please call for any questions or concerns.

## 2014-05-20 ENCOUNTER — Encounter: Payer: Self-pay | Admitting: Obstetrics & Gynecology

## 2014-06-10 ENCOUNTER — Encounter (HOSPITAL_COMMUNITY): Payer: Self-pay | Admitting: Pharmacy Technician

## 2014-06-11 ENCOUNTER — Ambulatory Visit (HOSPITAL_COMMUNITY)
Admission: RE | Admit: 2014-06-11 | Discharge: 2014-06-11 | Disposition: A | Discharge status: Home / Self Care | Payer: Medicare Other | Source: Ambulatory Visit | Attending: Anesthesiology | Admitting: Anesthesiology

## 2014-06-11 ENCOUNTER — Encounter (HOSPITAL_COMMUNITY): Payer: Self-pay

## 2014-06-11 ENCOUNTER — Encounter (HOSPITAL_COMMUNITY)
Admission: RE | Admit: 2014-06-11 | Discharge: 2014-06-11 | Disposition: A | Discharge status: Home / Self Care | Payer: Medicare Other | Source: Ambulatory Visit | Attending: Gynecologic Oncology | Admitting: Gynecologic Oncology

## 2014-06-11 DIAGNOSIS — J449 Chronic obstructive pulmonary disease, unspecified: Secondary | ICD-10-CM | POA: Diagnosis not present

## 2014-06-11 DIAGNOSIS — I517 Cardiomegaly: Secondary | ICD-10-CM | POA: Diagnosis not present

## 2014-06-11 DIAGNOSIS — C4490 Unspecified malignant neoplasm of skin, unspecified: Secondary | ICD-10-CM | POA: Insufficient documentation

## 2014-06-11 DIAGNOSIS — Z01818 Encounter for other preprocedural examination: Secondary | ICD-10-CM | POA: Diagnosis not present

## 2014-06-11 DIAGNOSIS — J4489 Other specified chronic obstructive pulmonary disease: Secondary | ICD-10-CM | POA: Insufficient documentation

## 2014-06-11 HISTORY — DX: Unsteadiness on feet: R26.81

## 2014-06-11 LAB — CBC
HEMATOCRIT: 42.7 % (ref 36.0–46.0)
HEMOGLOBIN: 14.2 g/dL (ref 12.0–15.0)
MCH: 30 pg (ref 26.0–34.0)
MCHC: 33.3 g/dL (ref 30.0–36.0)
MCV: 90.3 fL (ref 78.0–100.0)
Platelets: 207 10*3/uL (ref 150–400)
RBC: 4.73 MIL/uL (ref 3.87–5.11)
RDW: 13.5 % (ref 11.5–15.5)
WBC: 4.6 10*3/uL (ref 4.0–10.5)

## 2014-06-11 LAB — COMPREHENSIVE METABOLIC PANEL
ALK PHOS: 78 U/L (ref 39–117)
ALT: 8 U/L (ref 0–35)
AST: 16 U/L (ref 0–37)
Albumin: 4.4 g/dL (ref 3.5–5.2)
Anion gap: 10 (ref 5–15)
BUN: 12 mg/dL (ref 6–23)
CHLORIDE: 92 meq/L — AB (ref 96–112)
CO2: 30 mEq/L (ref 19–32)
Calcium: 10 mg/dL (ref 8.4–10.5)
Creatinine, Ser: 0.61 mg/dL (ref 0.50–1.10)
GFR, EST NON AFRICAN AMERICAN: 80 mL/min — AB (ref >90)
Glucose, Bld: 106 mg/dL — ABNORMAL HIGH (ref 70–99)
Potassium: 4.8 mEq/L (ref 3.7–5.3)
SODIUM: 132 meq/L — AB (ref 137–147)
TOTAL PROTEIN: 7.4 g/dL (ref 6.0–8.3)
Total Bilirubin: 0.4 mg/dL (ref 0.3–1.2)

## 2014-06-11 NOTE — Patient Instructions (Signed)
20 [name]  06/11/2014   Your procedure is scheduled on:  9-14 -2015  Enter through [facility] Entrance and follow signs to [facility]. Arrive at     0530   AM.  Call this number if you have problems the morning of surgery: [phone]  Or Presurgical Testing [phone].   For Living Will and/or Health Care Power Attorney Forms: please provide copy for your medical record,may bring AM of surgery(Forms should be already notarized -we do not provide this service).( 06-11-14 Yes/  information provided, will bring Living Will AM of 06-14-14).     Do not eat food/ or drink: After Midnight.    Take these medicines the morning of surgery with A SIP OF WATER: use/ bring eye drops.   Do not wear jewelry, make-up or nail polish.  Do not wear lotions, powders, or perfumes. You may not  wear deodorant.  Do not shave 48 hours(2 days) prior to first CHG shower(legs and under arms).(Shaving face and neck okay.)  Do not bring valuables to the hospital.(Hospital is not responsible for lost valuables).  Contacts, dentures or removable bridgework, body piercing, hair pins may not be worn into surgery.  Leave suitcase in the car. After surgery it may be brought to your room.  For patients admitted to the hospital, checkout time is 11:00 AM the day of discharge.(Restricted visitors-Any Persons displaying flu-like symptoms or illness).    Patients discharged the day of surgery will not be allowed to drive home. Must have responsible person with you x 24 hours once discharged.  Name and phone number of your driver: Conni Slipper, daughter [phone] cell  Special Instructions: CHG(Chlorhedine 4%-"Hibiclens","Betasept","Aplicare") Shower Use Special Wash: see special instructions.(avoid face and genitals)   Please read over the following fact sheets that you were given: Incentive Spirometry.   _________________    [facility] - Preparing for Surgery Before surgery, you can play an  important role.  Because skin is not sterile, your skin needs to be as free of germs as possible.  You can reduce the number of germs on your skin by washing with CHG (chlorahexidine gluconate) soap before surgery.  CHG is an antiseptic cleaner which kills germs and bonds with the skin to continue killing germs even after washing. Please DO NOT use if you have an allergy to CHG or antibacterial soaps.  If your skin becomes reddened/irritated stop using the CHG and inform your nurse when you arrive at Short Stay. Do not shave (including legs and underarms) for at least 48 hours prior to the first CHG shower.  You may shave your face/neck. Please follow these instructions carefully:  1.  Shower with CHG Soap the night before surgery and the  morning of Surgery.  2.  If you choose to wash your hair, wash your hair first as usual with your  normal  shampoo.  3.  After you shampoo, rinse your hair and body thoroughly to remove the  shampoo.                           4.  Use CHG as you would any other liquid soap.  You can apply chg directly  to the skin and wash                       Gently with a scrungie or clean washcloth.  5.  Apply the CHG Soap to your  body ONLY FROM THE NECK DOWN.   Do not use on face/ open                           Wound or open sores. Avoid contact with eyes, ears mouth and genitals (private parts).                       Wash face,  Genitals (private parts) with your normal soap.             6.  Wash thoroughly, paying special attention to the area where your surgery  will be performed.  7.  Thoroughly rinse your body with warm water from the neck down.  8.  DO NOT shower/wash with your normal soap after using and rinsing off  the CHG Soap.                9.  Pat yourself dry with a clean towel.            10.  Wear clean pajamas.            11.  Place clean sheets on your bed the night of your first shower and do not  sleep with pets. Day of Surgery : Do not apply any  lotions/deodorants the morning of surgery.  Please wear clean clothes to the hospital/surgery center.  FAILURE TO FOLLOW THESE INSTRUCTIONS MAY RESULT IN THE CANCELLATION OF YOUR SURGERY PATIENT SIGNATURE_________________________________   Incentive Spirometer  An incentive spirometer is a tool that can help keep your lungs clear and active. This tool measures how well you are filling your lungs with each breath. Taking long deep breaths may help reverse or decrease the chance of developing breathing (pulmonary) problems (especially infection) following:  A long period of time when you are unable to move or be active. BEFORE THE PROCEDURE   If the spirometer includes an indicator to show your best effort, your nurse or respiratory therapist will set it to a desired goal.  If possible, sit up straight or lean slightly forward. Try not to slouch.  Hold the incentive spirometer in an upright position. INSTRUCTIONS FOR USE  1. Sit on the edge of your bed if possible, or sit up as far as you can in bed or on a chair. 2. Hold the incentive spirometer in an upright position. 3. Breathe out normally. 4. Place the mouthpiece in your mouth and seal your lips tightly around it. 5. Breathe in slowly and as deeply as possible, raising the piston or the ball toward the top of the column. 6. Hold your breath for 3-5 seconds or for as long as possible. Allow the piston or ball to fall to the bottom of the column. 7. Remove the mouthpiece from your mouth and breathe out normally. 8. Rest for a few seconds and repeat Steps 1 through 7 at least 10 times every 1-2 hours when you are awake. Take your time and take a few normal breaths between deep breaths. 9. The spirometer may include an indicator to show your best effort. Use the indicator as a goal to work toward during each repetition. 10. After each set of 10 deep breaths, practice coughing to be sure your lungs are clear. If you have an incision (the  cut made at the time of surgery), support your incision when coughing by placing a pillow or rolled up towels firmly against it. Once you are able  to get out of bed, walk around indoors and cough well. You may stop using the incentive spirometer when instructed by your caregiver.  RISKS AND COMPLICATIONS  Take your time so you do not get dizzy or light-headed.  If you are in pain, you may need to take or ask for pain medication before doing incentive spirometry. It is harder to take a deep breath if you are having pain. AFTER USE  Rest and breathe slowly and easily.  It can be helpful to keep track of a log of your progress. Your caregiver can provide you with a simple table to help with this. If you are using the spirometer at home, follow these instructions: [place] IF:   You are having difficultly using the spirometer.  You have trouble using the spirometer as often as instructed.  Your pain medication is not giving enough relief while using the spirometer.  You develop fever of 100.5 F (38.1 C) or higher. SEEK IMMEDIATE MEDICAL CARE IF:   You cough up bloody sputum that had not been present before.  You develop fever of 102 F (38.9 C) or greater.  You develop worsening pain at or near the incision site. MAKE SURE YOU:   Understand these instructions.  Will watch your condition.  Will get help right away if you are not doing well or get worse. Document Released: 01/28/2007 Document Revised: 12/10/2011 Document Reviewed: 03/31/2007 ExitCare Patient Information 2014 Exit[employer], [place].   ________________________________________________________________________  NURSE SIGNATURE__________________________________  ________________________________________________________________________

## 2014-06-11 NOTE — Pre-Procedure Instructions (Signed)
06-11-14 1605 CXR viewable in [facility] today, please note- spoke with Melissa Cross,NP.

## 2014-06-14 ENCOUNTER — Encounter (HOSPITAL_COMMUNITY)
Admission: RE | Disposition: A | Discharge status: Home / Self Care | Payer: Self-pay | Source: Ambulatory Visit | Attending: Gynecologic Oncology

## 2014-06-14 ENCOUNTER — Ambulatory Visit (HOSPITAL_COMMUNITY)
Admission: RE | Admit: 2014-06-14 | Discharge: 2014-06-15 | Disposition: A | Discharge status: Home / Self Care | Payer: Medicare Other | Source: Ambulatory Visit | Attending: Gynecologic Oncology | Admitting: Gynecologic Oncology

## 2014-06-14 ENCOUNTER — Encounter (HOSPITAL_COMMUNITY): Payer: Medicare Other | Admitting: Registered Nurse

## 2014-06-14 ENCOUNTER — Ambulatory Visit (HOSPITAL_COMMUNITY): Payer: Medicare Other | Admitting: Registered Nurse

## 2014-06-14 ENCOUNTER — Encounter (HOSPITAL_COMMUNITY): Payer: Self-pay | Admitting: *Deleted

## 2014-06-14 DIAGNOSIS — R011 Cardiac murmur, unspecified: Secondary | ICD-10-CM | POA: Insufficient documentation

## 2014-06-14 DIAGNOSIS — K5900 Constipation, unspecified: Secondary | ICD-10-CM | POA: Insufficient documentation

## 2014-06-14 DIAGNOSIS — D071 Carcinoma in situ of vulva: Secondary | ICD-10-CM | POA: Diagnosis not present

## 2014-06-14 DIAGNOSIS — R32 Unspecified urinary incontinence: Secondary | ICD-10-CM | POA: Insufficient documentation

## 2014-06-14 DIAGNOSIS — F0390 Unspecified dementia without behavioral disturbance: Secondary | ICD-10-CM | POA: Insufficient documentation

## 2014-06-14 DIAGNOSIS — Z888 Allergy status to other drugs, medicaments and biological substances status: Secondary | ICD-10-CM | POA: Diagnosis not present

## 2014-06-14 DIAGNOSIS — H409 Unspecified glaucoma: Secondary | ICD-10-CM | POA: Diagnosis not present

## 2014-06-14 DIAGNOSIS — Z881 Allergy status to other antibiotic agents status: Secondary | ICD-10-CM | POA: Diagnosis not present

## 2014-06-14 DIAGNOSIS — Z780 Asymptomatic menopausal state: Secondary | ICD-10-CM | POA: Diagnosis not present

## 2014-06-14 DIAGNOSIS — I10 Essential (primary) hypertension: Secondary | ICD-10-CM | POA: Insufficient documentation

## 2014-06-14 HISTORY — PX: VULVECTOMY: SHX1086

## 2014-06-14 LAB — CREATININE, SERUM
CREATININE: 0.52 mg/dL (ref 0.50–1.10)
GFR, EST NON AFRICAN AMERICAN: 85 mL/min — AB (ref >90)

## 2014-06-14 LAB — CBC
HCT: 42.6 % (ref 36.0–46.0)
HEMOGLOBIN: 14.3 g/dL (ref 12.0–15.0)
MCH: 29.9 pg (ref 26.0–34.0)
MCHC: 33.6 g/dL (ref 30.0–36.0)
MCV: 88.9 fL (ref 78.0–100.0)
PLATELETS: 194 10*3/uL (ref 150–400)
RBC: 4.79 MIL/uL (ref 3.87–5.11)
RDW: 13.5 % (ref 11.5–15.5)
WBC: 4.7 10*3/uL (ref 4.0–10.5)

## 2014-06-14 SURGERY — VULVECTOMY
Anesthesia: General | Site: Vulva

## 2014-06-14 MED ORDER — ATROPINE SULFATE 0.4 MG/ML IJ SOLN
INTRAMUSCULAR | Status: AC
  Filled 2014-06-14: qty 2

## 2014-06-14 MED ORDER — FENTANYL CITRATE 0.05 MG/ML IJ SOLN
INTRAMUSCULAR | Status: AC
  Filled 2014-06-14: qty 5

## 2014-06-14 MED ORDER — KCL IN DEXTROSE-NACL 20-5-0.45 MEQ/L-%-% IV SOLN
INTRAVENOUS | Status: DC
  Administered 2014-06-14 (×2): via INTRAVENOUS
  Filled 2014-06-14: qty 1000

## 2014-06-14 MED ORDER — EPHEDRINE SULFATE 50 MG/ML IJ SOLN
INTRAMUSCULAR | Status: AC
  Filled 2014-06-14: qty 1

## 2014-06-14 MED ORDER — LACTATED RINGERS IV SOLN
INTRAVENOUS | Status: DC | PRN
  Administered 2014-06-14: 07:00:00 via INTRAVENOUS

## 2014-06-14 MED ORDER — POTASSIUM GLUCONATE 595 MG PO CAPS: 1.0000 | ORAL_CAPSULE | Freq: Every day | ORAL | Status: DC

## 2014-06-14 MED ORDER — LIDOCAINE HCL (PF) 1 % IJ SOLN
INTRAMUSCULAR | Status: DC | PRN
  Administered 2014-06-14: 10 mL

## 2014-06-14 MED ORDER — LATANOPROST 0.005 % OP SOLN
1.0000 [drp] | Freq: Every day | OPHTHALMIC | Status: DC
  Filled 2014-06-14: qty 2.5

## 2014-06-14 MED ORDER — PSYLLIUM 0.52 G PO CAPS: 0.5200 g | ORAL_CAPSULE | Freq: Every day | ORAL | Status: DC

## 2014-06-14 MED ORDER — IBUPROFEN 200 MG PO TABS: 600.0000 mg | ORAL_TABLET | Freq: Four times a day (QID) | ORAL | Status: DC | PRN

## 2014-06-14 MED ORDER — ADULT MULTIVITAMIN W/MINERALS CH
1.0000 | ORAL_TABLET | Freq: Every day | ORAL | Status: DC
  Administered 2014-06-15: 1 via ORAL
  Filled 2014-06-14: qty 1

## 2014-06-14 MED ORDER — ACETAMINOPHEN 500 MG PO TABS
500.0000 mg | ORAL_TABLET | Freq: Four times a day (QID) | ORAL | Status: DC | PRN
  Administered 2014-06-14 – 2014-06-15 (×2): 500 mg via ORAL
  Filled 2014-06-14 (×2): qty 1

## 2014-06-14 MED ORDER — ASPIRIN EC 81 MG PO TBEC
81.0000 mg | DELAYED_RELEASE_TABLET | Freq: Every day | ORAL | Status: DC
  Administered 2014-06-15: 81 mg via ORAL
  Filled 2014-06-14: qty 1

## 2014-06-14 MED ORDER — ACETIC ACID 4% SOLUTION
1.0000 "application " | Freq: Once | Status: DC
  Filled 2014-06-14: qty 240

## 2014-06-14 MED ORDER — EPHEDRINE SULFATE 50 MG/ML IJ SOLN
INTRAMUSCULAR | Status: DC | PRN
  Administered 2014-06-14 (×3): 5 mg via INTRAVENOUS

## 2014-06-14 MED ORDER — NAPHAZOLINE HCL 0.1 % OP SOLN: 1.0000 [drp] | Freq: Four times a day (QID) | OPHTHALMIC | Status: DC | PRN

## 2014-06-14 MED ORDER — ONDANSETRON HCL 4 MG/2ML IJ SOLN
INTRAMUSCULAR | Status: AC
  Filled 2014-06-14: qty 2

## 2014-06-14 MED ORDER — ACETIC ACID 5 % SOLN
Status: DC | PRN
  Administered 2014-06-14: 1 via TOPICAL

## 2014-06-14 MED ORDER — PROPOFOL 10 MG/ML IV BOLUS
INTRAVENOUS | Status: AC
  Filled 2014-06-14: qty 20

## 2014-06-14 MED ORDER — ONDANSETRON HCL 4 MG/2ML IJ SOLN
INTRAMUSCULAR | Status: DC | PRN
  Administered 2014-06-14: 4 mg via INTRAVENOUS

## 2014-06-14 MED ORDER — ACETIC ACID 5 % SOLN
Freq: Once | Status: DC
  Filled 2014-06-14: qty 500

## 2014-06-14 MED ORDER — HYDROMORPHONE HCL PF 1 MG/ML IJ SOLN: 0.2000 mg | INTRAMUSCULAR | Status: DC | PRN

## 2014-06-14 MED ORDER — PSYLLIUM 95 % PO PACK
1.0000 | PACK | Freq: Every day | ORAL | Status: DC
  Filled 2014-06-14: qty 1

## 2014-06-14 MED ORDER — LIDOCAINE HCL (CARDIAC) 20 MG/ML IV SOLN
INTRAVENOUS | Status: DC | PRN
  Administered 2014-06-14: 50 mg via INTRAVENOUS

## 2014-06-14 MED ORDER — SODIUM CHLORIDE 0.9 % IJ SOLN
INTRAMUSCULAR | Status: AC
  Filled 2014-06-14: qty 10

## 2014-06-14 MED ORDER — ENOXAPARIN SODIUM 40 MG/0.4ML ~~LOC~~ SOLN
40.0000 mg | SUBCUTANEOUS | Status: DC
  Administered 2014-06-15: 40 mg via SUBCUTANEOUS
  Filled 2014-06-14 (×2): qty 0.4

## 2014-06-14 MED ORDER — PHENYLEPHRINE HCL 10 MG/ML IJ SOLN
INTRAMUSCULAR | Status: DC | PRN
  Administered 2014-06-14 (×2): 80 ug via INTRAVENOUS
  Administered 2014-06-14: 40 ug via INTRAVENOUS
  Administered 2014-06-14: 80 ug via INTRAVENOUS

## 2014-06-14 MED ORDER — TRAMADOL HCL 50 MG PO TABS: 50.0000 mg | ORAL_TABLET | Freq: Four times a day (QID) | ORAL | Status: DC | PRN

## 2014-06-14 MED ORDER — NAPHAZOLINE-POLYETHYL GLYCOL 0.012-0.2 % OP SOLN: 1.0000 [drp] | Freq: Every day | OPHTHALMIC | Status: DC

## 2014-06-14 MED ORDER — 0.9 % SODIUM CHLORIDE (POUR BTL) OPTIME
TOPICAL | Status: DC | PRN
  Administered 2014-06-14: 1000 mL

## 2014-06-14 MED ORDER — LIDOCAINE HCL 1 % IJ SOLN
INTRAMUSCULAR | Status: AC
  Filled 2014-06-14: qty 20

## 2014-06-14 MED ORDER — LIDOCAINE HCL (CARDIAC) 20 MG/ML IV SOLN
INTRAVENOUS | Status: AC
Start: 2014-06-14 — End: 2014-06-14
  Filled 2014-06-14: qty 5

## 2014-06-14 MED ORDER — PHENYLEPHRINE 40 MCG/ML (10ML) SYRINGE FOR IV PUSH (FOR BLOOD PRESSURE SUPPORT)
PREFILLED_SYRINGE | INTRAVENOUS | Status: AC
  Filled 2014-06-14: qty 10

## 2014-06-14 MED ORDER — FENTANYL CITRATE 0.05 MG/ML IJ SOLN
INTRAMUSCULAR | Status: DC | PRN
  Administered 2014-06-14: 25 ug via INTRAVENOUS

## 2014-06-14 MED ORDER — PROPOFOL 10 MG/ML IV BOLUS
INTRAVENOUS | Status: DC | PRN
  Administered 2014-06-14: 100 mg via INTRAVENOUS

## 2014-06-14 MED ORDER — KCL IN DEXTROSE-NACL 20-5-0.45 MEQ/L-%-% IV SOLN
INTRAVENOUS | Status: AC
  Administered 2014-06-14: 10 mL/h
  Filled 2014-06-14: qty 1000

## 2014-06-14 MED ORDER — POTASSIUM GLUCONATE 595 (99 K) MG PO TABS
595.0000 mg | ORAL_TABLET | Freq: Every day | ORAL | Status: DC
  Administered 2014-06-15: 595 mg via ORAL
  Filled 2014-06-14 (×3): qty 1

## 2014-06-14 SURGICAL SUPPLY — 29 items
BLADE SURG 15 STRL LF DISP TIS (BLADE) ×2 IMPLANT
BLADE SURG 15 STRL SS (BLADE) ×4
BNDG GAUZE ELAST 4 BULKY (GAUZE/BANDAGES/DRESSINGS) ×1 IMPLANT
BRIEF STRETCH FOR OB PAD LRG (UNDERPADS AND DIAPERS) ×1 IMPLANT
CANISTER SUCTION 2500CC (MISCELLANEOUS) ×1 IMPLANT
DRAPE SURG IRRIG POUCH 19X23 (DRAPES) ×2 IMPLANT
DRSG PAD ABDOMINAL 8X10 ST (GAUZE/BANDAGES/DRESSINGS) ×4 IMPLANT
GLOVE BIO SURGEON STRL SZ 6.5 (GLOVE) ×2 IMPLANT
GLOVE BIO SURGEON STRL SZ7.5 (GLOVE) ×4 IMPLANT
GLOVE BIOGEL PI IND STRL 7.0 (GLOVE) ×1 IMPLANT
GLOVE BIOGEL PI INDICATOR 7.0 (GLOVE) ×1
MANIFOLD NEPTUNE II (INSTRUMENTS) ×1 IMPLANT
MARKER SKIN DUAL TIP RULER LAB (MISCELLANEOUS) ×2 IMPLANT
NDL HYPO 25X1 1.5 SAFETY (NEEDLE) IMPLANT
NEEDLE HYPO 22GX1.5 SAFETY (NEEDLE) ×1 IMPLANT
NEEDLE HYPO 25X1 1.5 SAFETY (NEEDLE) ×2 IMPLANT
NS IRRIG 1000ML POUR BTL (IV SOLUTION) ×2 IMPLANT
PACK COOL COMFORT PERI COLD (SET/KITS/TRAYS/PACK) ×1 IMPLANT
PACK ICE MAXI GEL EZY WRAP (MISCELLANEOUS) ×2 IMPLANT
PACK MINOR VAGINAL W LONG (CUSTOM PROCEDURE TRAY) ×2 IMPLANT
SPONGE LAP 18X18 X RAY DECT (DISPOSABLE) ×3 IMPLANT
SUT VIC AB 2-0 SH 27 (SUTURE) ×4
SUT VIC AB 2-0 SH 27X BRD (SUTURE) IMPLANT
SUT VIC AB 3-0 SH 27 (SUTURE) ×12
SUT VIC AB 3-0 SH 27X BRD (SUTURE) ×4 IMPLANT
SUT VIC AB 4-0 PS2 27 (SUTURE) ×3 IMPLANT
SUT VICRYL 0 UR6 27IN ABS (SUTURE) ×1 IMPLANT
TRAY FOLEY CATH 14FRSI W/METER (CATHETERS) IMPLANT
WATER STERILE IRR 1500ML POUR (IV SOLUTION) ×2 IMPLANT

## 2014-06-14 NOTE — Op Note (Signed)
PATIENT: [name] DATE: 06/14/14   Preop Diagnosis: VIN3  Postoperative Diagnosis: same  Surgery: modified radical anterior vulvectomy   Surgeons:  Donaciano Eva, MD Assistant: none  Anesthesia: General   Estimated blood loss: 61ml  IVF:  500 ml   Urine output: 527  ml   Complications: None   Pathology: anterior vulva with marking stitch at 12 o'clock midline superior margin  Operative findings: 2cm raised, erythematous area of leukoplakia at the anterior (midline) vulva extending/crossing the midline to the left.   Procedure: The patient was identified in the preoperative holding area. Informed consent was signed on the chart. Patient was seen history was reviewed and exam was performed.   The patient was then taken to the operating room and placed in the supine position with SCD hose on. General anesthesia was then induced without difficulty. She was then placed in the dorsolithotomy position. The perineum was prepped with Betadine. The vagina was prepped with Betadine. The patient was then draped after the prep was dried. A foley catheter to empty the bladder was placed under sterile conditions.  Timeout was performed the patient, procedure, antibiotic, allergy, and length of procedure.   The lesion was identified on the anterior vulva. The site of surgical excision was marked with a marking pen. The dermal plane surrounding the site of incision and bed of lesion was infiltrated with 1% lidocaine. A 10 blade scalpel was used to circumferentially excise the elipse of skin on the anterior vulva. Blunt dissection to the underlying fascia of the urogenital triangle was performed. The bovie was used to dissect and separate the specimen from the surrounding skin in a hemostatic fashion. The bovie electrosurgical device was used to separate the elipse in a hemostatic fasion. The specimen was handed off the field and marked at 12 oclock midline superior margin with a  suture to orient. The bovie was used to achieve hemostasis at the surgical bed. 2-0 and 3-0 vicryl interrupted mattress sutures were used to reapproximate the deeper layers in a tension free closure.  4-0 vicryl interrupted sutures were used to close the skin.   All instrument, suture, laparotomy, Ray-Tec, and needle counts were correct x2. The patient tolerated the procedure well and was taken recovery room in stable condition. This is Everitt Amber dictating an operative note on [name].

## 2014-06-14 NOTE — Discharge Instructions (Addendum)
Vulvectomy, Care After °The vulva is the external female genitalia, outside and around the vagina and pubic bone. It consists of: °· The skin on, and in front of, the pubic bone. °· The clitoris. °· The labia majora (large lips) on the outside of the vagina. °· The labia minora (small lips) around the opening of the vagina. °· The opening and the skin in and around the vagina. °A vulvectomy is the removal of the tissue of the vulva, which sometimes includes removal of the lymph nodes and tissue in the groin areas. °These discharge instructions provide you with general information on caring for yourself after you leave the hospital. It is also important that you know the warning signs of complications, so that you can seek treatment. Please read the instructions outlined below and refer to this sheet in the next few weeks. Your caregiver may also give you specific information and medicines. If you have any questions or complications after discharge, please call your caregiver. °ACTIVITY °· Rest as much as possible the first two weeks after discharge. °· Arrange to have help from family or others with your daily activities when you go home. °· Avoid heavy lifting (more than 5 pounds), pushing, or pulling. °· If you feel tired, balance your activity with rest periods. °· Follow your caregiver's instruction about climbing stairs and driving a car. °· Increase activity gradually. °· Do not exercise until you have permission from your caregiver. °LEG AND FOOT CARE °If your doctor has removed lymph nodes from your groin area, there may be an increase in swelling of your legs and feet. You can help prevent swelling by doing the following: °· Elevate your legs while sitting or lying down. °· If your caregiver has ordered special stockings, wear them according to instructions. °· Avoid standing in one place for long periods of time. °· Call the physical therapy department if you have any questions about swelling or treatment  for swelling. °· Avoid salt in your diet. It can cause fluid retention and swelling. °· Do not Geroge Gilliam your legs, especially when sitting. °NUTRITION °· You may resume your normal diet. °· Drink 6 to 8 glasses of fluids a day. °· Eat a healthy, balanced diet including portions of food from the meat (protein), milk, fruit, vegetable, and bread groups. °· Your caregiver may recommend you take a multivitamin with iron. °ELIMINATION °· You may notice that your stream of urine is at a different angle, and may tend to spray. Using a plastic funnel may help to decrease urine spray. °· If constipation occurs, drink more liquids, and add more fruits, vegetables, and bran to your diet. You may take a mild laxative, such as Milk of Magnesia, Metamucil, or a stool softener such as Colace, with permission from your caregiver. °HYGIENE °· You may shower and wash your hair. °· Check with your caregiver about tub baths. °· Do not add any bath oils or chemicals to your bath water, after you have permission to take baths. °· While passing urine, pour water from a bottle or spray over your vulva to dilute the urine as it passes the incision (this will decrease burning and discomfort). °· Clean yourself well after moving your bowels. °· After urinating, do not wipe. Dap or pat dry with toilet paper or a dry cleath soft cloth. °· A sitz bath will help keep your perineal area clean, reduce swelling, and provide comfort. °· Avoid wearing underpants for the first 2 weeks and wear loose skirts to   allow circulation of air around the incision  You do not need to apply dressings, salves or lotions to the wound.  The stitches are self-dissolving and will absorb and disappear over a couple of months (it is normal to notice the knot from the stitches on toilet paper after voiding). HOME CARE INSTRUCTIONS   Apply a soft ice pack (or frozen bag of peas) to your perineum (vulva) every hour in the first 48 hours after surgery. This will reduce  swelling.  Avoid activities that involve a lot of friction between your legs.  Avoid wearing pants or underpants in the 1st 2 weeks (skirts are preferable).  Take your temperature twice a day and record it, especially if you feel feverish or have chills.  Follow your caregiver's instructions about medicines, activity, and follow-up appointments after surgery.  Do not drink alcohol while taking pain medicine.  Change your dressing as advised by your caregiver.  You may take over-the-counter medicine for pain, recommended by your caregiver.  If your pain is not relieved with medicine, call your caregiver.  Do not take aspirin because it can cause bleeding.  Do not douche or use tampons (use a nonperfumed sanitary pad).  Do not have sexual intercourse until your caregiver gives you permission (typically 6 weeks postoperatively). Hugging, kissing, and playful sexual activity is fine with your caregiver's permission.  Warm sitz baths, with your caregiver's permission, are helpful to control swelling and discomfort.  Take showers instead of baths, until your caregiver gives you permission to take baths.  You may take a mild medicine for constipation, recommended by your caregiver. Bran foods and drinking a lot of fluids will help with constipation.  Make sure your family understands everything about your operation and recovery. SEEK MEDICAL CARE IF:   You notice swelling and redness around the wound area.  You notice a foul smell coming from the wound or on the surgical dressing.  You notice the wound is separating.  You have painful or bloody urination.  You develop nausea and vomiting.  You develop diarrhea.  You develop a rash.  You have a reaction or allergy from the medicine.  You feel dizzy or light-headed.  You need stronger pain medicine. SEEK IMMEDIATE MEDICAL CARE IF:   You develop a temperature of 102 F (38.9 C) or higher.  You pass out.  You develop  leg or chest pain.  You develop abdominal pain.  You develop shortness of breath.  You develop bleeding from the wound area.  You see pus in the wound area. MAKE SURE YOU:   Understand these instructions.  Will watch your condition.  Will get help right away if you are not doing well or get worse. Document Released: 05/01/2004 Document Revised: 02/01/2014 Document Reviewed: 08/19/2009 [facility] Patient Information 2015 [place], [place]. This information is not intended to replace advice given to you by your health care provider. Make sure you discuss any questions you have with your health care provider.  Tramadol tablets What is this medicine? TRAMADOL (TRA ma dole) is a pain reliever. It is used to treat moderate to severe pain in adults. This medicine may be used for other purposes; ask your health care provider or pharmacist if you have questions. COMMON BRAND NAME(S): Ultram What should I tell my health care provider before I take this medicine? They need to know if you have any of these conditions: -brain tumor -depression -drug abuse or addiction -head injury -if you frequently drink alcohol containing drinks -kidney  disease or trouble passing urine -liver disease -lung disease, asthma, or breathing problems -seizures or epilepsy -suicidal thoughts, plans, or attempt; a previous suicide attempt by you or a family member -an unusual or allergic reaction to tramadol, codeine, other medicines, foods, dyes, or preservatives -pregnant or trying to get pregnant -breast-feeding How should I use this medicine? Take this medicine by mouth with a full glass of water. Follow the directions on the prescription label. If the medicine upsets your stomach, take it with food or milk. Do not take more medicine than you are told to take. Talk to your pediatrician regarding the use of this medicine in children. Special care may be needed. Overdosage: If you think you have taken too much  of this medicine contact a poison control center or emergency room at once. NOTE: This medicine is only for you. Do not share this medicine with others. What if I miss a dose? If you miss a dose, take it as soon as you can. If it is almost time for your next dose, take only that dose. Do not take double or extra doses. What may interact with this medicine? Do not take this medicine with any of the following medications: -MAOIs like Carbex, Eldepryl, Marplan, Nardil, and Parnate This medicine may also interact with the following medications: -alcohol or medicines that contain alcohol -antihistamines -benzodiazepines -bupropion -carbamazepine or oxcarbazepine -clozapine -cyclobenzaprine -digoxin -furazolidone -linezolid -medicines for depression, anxiety, or psychotic disturbances -medicines for migraine headache like almotriptan, eletriptan, frovatriptan, naratriptan, rizatriptan, sumatriptan, zolmitriptan -medicines for pain like pentazocine, buprenorphine, butorphanol, meperidine, nalbuphine, and propoxyphene -medicines for sleep -muscle relaxants -naltrexone -phenobarbital -phenothiazines like perphenazine, thioridazine, chlorpromazine, mesoridazine, fluphenazine, prochlorperazine, promazine, and trifluoperazine -procarbazine -warfarin This list may not describe all possible interactions. Give your health care provider a list of all the medicines, herbs, non-prescription drugs, or dietary supplements you use. Also tell them if you smoke, drink alcohol, or use illegal drugs. Some items may interact with your medicine. What should I watch for while using this medicine? Tell your doctor or health care professional if your pain does not go away, if it gets worse, or if you have new or a different type of pain. You may develop tolerance to the medicine. Tolerance means that you will need a higher dose of the medicine for pain relief. Tolerance is normal and is expected if you take this  medicine for a long time. Do not suddenly stop taking your medicine because you may develop a severe reaction. Your body becomes used to the medicine. This does NOT mean you are addicted. Addiction is a behavior related to getting and using a drug for a non-medical reason. If you have pain, you have a medical reason to take pain medicine. Your doctor will tell you how much medicine to take. If your doctor wants you to stop the medicine, the dose will be slowly lowered over time to avoid any side effects. You may get drowsy or dizzy. Do not drive, use machinery, or do anything that needs mental alertness until you know how this medicine affects you. Do not stand or sit up quickly, especially if you are an older patient. This reduces the risk of dizzy or fainting spells. Alcohol can increase or decrease the effects of this medicine. Avoid alcoholic drinks. You may have constipation. Try to have a bowel movement at least every 2 to 3 days. If you do not have a bowel movement for 3 days, call your doctor or health care professional. Your  mouth may get dry. Chewing sugarless gum or sucking hard candy, and drinking plenty of water may help. Contact your doctor if the problem does not go away or is severe. What side effects may I notice from receiving this medicine? Side effects that you should report to your doctor or health care professional as soon as possible: -allergic reactions like skin rash, itching or hives, swelling of the face, lips, or tongue -breathing difficulties, wheezing -confusion -itching -light headedness or fainting spells -redness, blistering, peeling or loosening of the skin, including inside the mouth -seizures Side effects that usually do not require medical attention (report to your doctor or health care professional if they continue or are bothersome): -constipation -dizziness -drowsiness -headache -nausea, vomiting This list may not describe all possible side effects. Call  your doctor for medical advice about side effects. You may report side effects to FDA at 1-800-FDA-1088. Where should I keep my medicine? Keep out of the reach of children. Store at room temperature between 15 and 30 degrees C (59 and 86 degrees F). Keep container tightly closed. Throw away any unused medicine after the expiration date. NOTE: This sheet is a summary. It may not cover all possible information. If you have questions about this medicine, talk to your doctor, pharmacist, or health care provider.  2015, [employer]. (2010-05-31 11:55:44)

## 2014-06-14 NOTE — H&P (View-Only) (Signed)
Consult Note: Gyn-Onc  Consult was requested by Dr. Elonda Husky for the evaluation of [name] 80 y.o. female  CC:  Chief Complaint  Patient presents with  . Vulvar Dysplasia    New patient    Assessment/Plan:  [name]  is a 80 y.o.  year old who is seen in consultation at the request of Dr. Elonda Husky for her high-grade vulvar dysplasia.  [name] has a 3 cm lesion on the mons pubis approximating the clitoral hood slightly to the patient's left. There is some degree of nodularity at the anterior most aspect of the lesion which was biopsied, and this is a more concerning area for invasive disease, though the biopsy results are somewhat reassuring. The lesion is very symptomatic. For these 2 reasons I am recommending a partial modified radical vulvectomy (anterior). We will try to achieve at least 1 cm margins around the lesion. She clinically has no evidence of inguinal adenopathy. Given the preoperative benign pathology, intraoperative surgical evaluation of lymph nodes is not indicated at this time, however his discussed with the patient and her daughter the definitive pathology reveals invasive squamous cell carcinoma we would recommend evaluation of the groins ideally surgically if not via PET scan.  I discussed anticipated surgical approach, the possibility for consideration of this procedure being performed under a spinal anesthesia (at the anesthesiologist discretion), and postoperative care. We will admit the patient for overnight stay in the hospital. I expect discharge on postoperative day 1. I discussed perineal care in the postoperative period. Because the lesion is greater than 2 cm away from the urethral meatus have a low suspicion that surgery well complicate urinary voiding. I did discuss risks associated the surgery including wound separation, infection, bleeding, and independent of clitoral sensory function. 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   HPI: [name] is a 80 year old woman who lives alone no in close proximity to her daughter who has a 2 year history of vulvar pruritus. One year ago she was treated by primary care physician for a vulvar candidiasis with a yeast cream. She had only minimal resolution of symptoms with this treatment. She was seen by Dr. Elonda Husky on 05/03/2014 who identified a somewhat nodular lesion on the anterior vulvar and performed a 4 mm punch biopsy. 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 The patient continues to have vulvar pruritus. She resolves her symptom with application of a washcloth against the perineum.   Interval History: she is healing well from her biopsy.  Current Meds:  Outpatient Encounter Prescriptions as of 05/17/2014  Medication Sig  . acetaminophen (TYLENOL) 500 MG tablet Take 500 mg by mouth every 6 (six) hours as needed.    Marland Kitchen aspirin 81 MG tablet Take 81 mg by mouth daily.  . Glucosamine-Chondroitin (OSTEO BI-FLEX REGULAR STRENGTH PO) Take 1 tablet by mouth daily.    Marland Kitchen latanoprost (XALATAN) 0.005 % ophthalmic solution at bedtime.    . Multiple Vitamins-Minerals (MULTIVITAMIN WITH MINERALS) tablet Take 1 tablet by mouth daily.    . Naphazoline-Polyethyl Glycol (EYE DROPS) 0.012-0.2 % SOLN Apply 1 drop to eye daily.   . Potassium Gluconate 595 MG CAPS Take 1 capsule by mouth daily.    Marland Kitchen  Probiotic Product (FLORA-Q PO) Take 1 tablet by mouth daily.    . psyllium (REGULOID) 0.52 G capsule Take by mouth daily.      Allergy:  Allergies  Allergen Reactions  . Flagyl [Metronidazole Hcl]   . Ciprofloxacin Rash    Social Hx:   History   Social History  . Marital Status:  Widowed    Spouse Name: N/A    Number of Children: N/A  . Years of Education: N/A   Occupational History  . Retired    Social History Main Topics  . Smoking status: Never Smoker   . Smokeless tobacco: Never Used  . Alcohol Use: No  . Drug Use: No  . Sexual Activity: Not Currently   Other Topics Concern  . Not on file   Social History Narrative  . No narrative on file    Past Surgical Hx:  Past Surgical History  Procedure Laterality Date  . Orif right hip  2004    Dr.Harrison  . Bilateral eyelid surgery    . Colonoscopy  10/30/04    NUR    Past Medical Hx:  Past Medical History  Diagnosis Date  . Clostridium difficile colitis     dating back to 2004  . Glaucoma   . Diverticulosis   . Diverticulitis   . Recurrent UTI   . Chronic constipation   . Nausea   . Dementia   . Hypertension   . Murmur, cardiac     Past Gynecological History:  SVD. Postmenopausal.Occasional urinary incontinence.  No LMP recorded. Patient is postmenopausal.  Family Hx:  Family History  Problem Relation Age of Onset  . Diabetes type II      Review of Systems:  Constitutional  Feels well,    ENT Normal appearing ears and nares bilaterally Skin/Breast  No rash, sores, jaundice, itching, dryness Cardiovascular  No chest pain, shortness of breath, or edema  Pulmonary  No cough or wheeze.  Gastro Intestinal  No nausea, vomitting, or diarrhoea. No bright red blood per rectum, no abdominal pain, change in bowel movement, or constipation.  Genito Urinary  No frequency, urgency, dysuria, see HPI. Musculo Skeletal  No myalgia, arthralgia, joint swelling or pain  Neurologic  No weakness, numbness, change in gait,  Psychology  No depression, anxiety, insomnia.   Vitals:  Blood pressure 154/91, pulse 85, temperature 98.2 F (36.8 C), resp. rate 20, height 5\' 6"  (1.676 m), weight 109 lb 11.2 oz (49.76 kg).  Physical Exam: WD in NAD Neck  Supple NROM, without any enlargements.   Lymph Node Survey No cervical supraclavicular or inguinal adenopathy Cardiovascular  Pulse normal rate, regularity and rhythm. S1 and S2 normal.  Lungs  Clear to auscultation bilateraly, without wheezes/crackles/rhonchi. Good air movement.  Skin  No rash/lesions/breakdown  Psychiatry  Alert and oriented to person, place, and time  Abdomen  Normoactive bowel sounds, abdomen soft, non-tender and non-obese without evidence of hernia.  Back No CVA tenderness Genito Urinary  Vulva/vagina: 3 cm lesion with leukoplakia and slight nodularity on the proximal midline mons pubis slightly to the patient's left approximating the clitoral hood. Lesion is not fixed to the underlying symphysis pubis. The biopsy site is visualized at the anterior aspect of this lesion is well-healed.  Bladder/urethra:  No lesions or masses, well supported bladder  Vagina: deferred  Cervix: deferred  Uterus: deferred  Adnexa: deferred Rectal  Good tone, no masses no cul de sac nodularity.  Extremities  No bilateral cyanosis, clubbing or edema.  Donaciano Eva, MD   05/17/2014, 4:43 PM

## 2014-06-14 NOTE — Anesthesia Preprocedure Evaluation (Addendum)
Anesthesia Evaluation  Patient identified by MRN, date of birth, ID band Patient awake    Reviewed: Allergy & Precautions, H&P , NPO status , Patient's Chart, lab work & pertinent test results  Airway Mallampati: II TM Distance: >3 FB Neck ROM: Full    Dental no notable dental hx.    Pulmonary neg pulmonary ROS,  breath sounds clear to auscultation  Pulmonary exam normal       Cardiovascular hypertension, Pt. on medications + Valvular Problems/Murmurs Rhythm:Regular Rate:Normal     Neuro/Psych PSYCHIATRIC DISORDERS negative neurological ROS     GI/Hepatic negative GI ROS, Neg liver ROS,   Endo/Other  negative endocrine ROS  Renal/GU negative Renal ROS     Musculoskeletal negative musculoskeletal ROS (+)   Abdominal   Peds  Hematology negative hematology ROS (+)   Anesthesia Other Findings   Reproductive/Obstetrics negative OB ROS                          Anesthesia Physical Anesthesia Plan  ASA: III  Anesthesia Plan: General   Post-op Pain Management:    Induction: Intravenous  Airway Management Planned: LMA  Additional Equipment:   Intra-op Plan:   Post-operative Plan: Extubation in OR  Informed Consent: I have reviewed the patients History and Physical, chart, labs and discussed the procedure including the risks, benefits and alternatives for the proposed anesthesia with the patient or authorized representative who has indicated his/her understanding and acceptance.   Dental advisory given  Plan Discussed with: CRNA  Anesthesia Plan Comments:         Anesthesia Quick Evaluation

## 2014-06-14 NOTE — Interval H&P Note (Signed)
History and Physical Interval Note:  06/14/2014 7:27 AM  [name]  has presented today for surgery, with the diagnosis of VULVAR DYSPLASIA   The various methods of treatment have been discussed with the patient and family. After consideration of risks, benefits and other options for treatment, the patient has consented to  Procedure(s): [place]  (N/A) as a surgical intervention .  The patient's history has been reviewed, patient examined, no change in status, stable for surgery.  I have reviewed the patient's chart and labs.  Questions were answered to the patient's satisfaction.     Donaciano Eva

## 2014-06-14 NOTE — Transfer of Care (Signed)
Immediate Anesthesia Transfer of Care Note  Patient: [name]  Procedure(s) Performed: Procedure(s): PARTIAL MODIFED ANTERIOR RADICAL VULVECTOMY  (N/A)  Patient Location: PACU  Anesthesia Type:General  Level of Consciousness: awake, alert , oriented and patient cooperative  Airway & Oxygen Therapy: Patient Spontanous Breathing and Patient connected to face mask oxygen  Post-op Assessment: Report given to PACU RN, Post -op Vital signs reviewed and stable and Patient moving all extremities  Post vital signs: Reviewed and stable  Complications: No apparent anesthesia complications

## 2014-06-14 NOTE — Anesthesia Postprocedure Evaluation (Signed)
Anesthesia Post Note  Patient: [name]  Procedure(s) Performed: Procedure(s) (LRB): PARTIAL MODIFED ANTERIOR RADICAL VULVECTOMY  (N/A)  Anesthesia type: General  Patient location: PACU  Post pain: Pain level controlled  Post assessment: Post-op Vital signs reviewed  Last Vitals: BP 140/69  Pulse 80  Temp(Src) 36.1 C  Resp 16  Ht 5\' 4"  (1.626 m)  Wt 109 lb (49.442 kg)  BMI 18.70 kg/m2  SpO2 96%  Post vital signs: Reviewed  Level of consciousness: sedated  Complications: No apparent anesthesia complications

## 2014-06-15 ENCOUNTER — Encounter (HOSPITAL_COMMUNITY): Payer: Self-pay | Admitting: Gynecologic Oncology

## 2014-06-15 DIAGNOSIS — D071 Carcinoma in situ of vulva: Secondary | ICD-10-CM | POA: Diagnosis not present

## 2014-06-15 MED ORDER — TRAMADOL HCL 50 MG PO TABS: 50.0000 mg | ORAL_TABLET | Freq: Four times a day (QID) | ORAL | Status: DC | PRN

## 2014-06-15 NOTE — Progress Notes (Signed)
Patient and daughter given discharge instructions.  Ice packs for home use

## 2014-06-15 NOTE — Discharge Summary (Signed)
Physician Discharge Summary  Patient ID: LAAIBAH WARTMAN MRN: [id number] DOB/AGE: 1926-01-03 80 y.o.  Admit date: 06/14/2014 Discharge date: 06/15/2014  Admission Diagnoses: VIN III (vulvar intraepithelial neoplasia III)  Discharge Diagnoses:  Principal Problem:   VIN III (vulvar intraepithelial neoplasia III) Active Problems:   Severe vulvar dysplasia   Discharged Condition:  The patient is in good condition and stable for discharge.    Hospital Course: On 06/14/2014, the patient underwent the following: Procedure(s):  PARTIAL MODIFED ANTERIOR RADICAL VULVECTOMY .  The postoperative course was uneventful.  She was discharged to home on postoperative day 1 tolerating a regular diet, having bowel movements, and voiding without difficulty.  Consults: None  Significant Diagnostic Studies: None  Treatments: surgery: see above  Discharge Exam: Blood pressure 120/61, pulse 90, temperature 98.4 F (36.9 C), temperature source Oral, resp. rate 18, height 5\' 4"  (1.626 m), weight 106 lb 11.2 oz (48.4 kg), SpO2 96.00%. General appearance: alert, cooperative, appears stated age, no distress and pleasantly confused- not a new finding, answering questions appropriately Resp: clear to auscultation bilaterally Cardio: regular rate and rhythm, S1, S2 normal, no murmur, click, rub or gallop GI: soft, non-tender; bowel sounds normal; no masses,  no organomegaly Extremities: extremities normal, atraumatic, no cyanosis or edema Incision/Wound: Anterior vulvectomy incision intact with sutures, no drainage or erythema   Disposition: Home with daughter  Discharge Instructions   Call MD for:  difficulty breathing, headache or visual disturbances    Complete by:  As directed      Call MD for:  extreme fatigue    Complete by:  As directed      Call MD for:  hives    Complete by:  As directed      Call MD for:  persistant dizziness or light-headedness    Complete by:  As directed      Call MD for:   persistant nausea and vomiting    Complete by:  As directed      Call MD for:  redness, tenderness, or signs of infection (pain, swelling, redness, odor or green/yellow discharge around incision site)    Complete by:  As directed      Call MD for:  severe uncontrolled pain    Complete by:  As directed      Call MD for:  temperature >100.4    Complete by:  As directed      Diet - low sodium heart healthy    Complete by:  As directed      Driving Restrictions    Complete by:  As directed      Increase activity slowly    Complete by:  As directed      Lifting restrictions    Complete by:  As directed   No lifting greater than 10 lbs.     Sexual Activity Restrictions    Complete by:  As directed   No sexual activity, nothing in the vagina, for 6 weeks.            Medication List         acetaminophen 500 MG tablet  Commonly known as:  TYLENOL  Take 500 mg by mouth every 6 (six) hours as needed.     aspirin 81 MG tablet  Take 81 mg by mouth daily.     EYE DROPS 0.012-0.2 % Soln  Generic drug:  Naphazoline-Polyethyl Glycol  Apply 1 drop to eye daily.     FLORA-Q PO  Take 1 tablet by  mouth daily.     latanoprost 0.005 % ophthalmic solution  Commonly known as:  XALATAN  Place 1 drop into both eyes at bedtime.     multivitamin with minerals tablet  Take 1 tablet by mouth daily.     OSTEO BI-FLEX REGULAR STRENGTH PO  Take 1 tablet by mouth daily.     Potassium Gluconate 595 MG Caps  Take 1 capsule by mouth daily.     psyllium 0.52 G capsule  Commonly known as:  REGULOID  Take by mouth daily.     traMADol 50 MG tablet  Commonly known as:  ULTRAM  Take 1 tablet (50 mg total) by mouth every 6 (six) hours as needed for moderate pain or severe pain.           Follow-up Information   Follow up with Donaciano Eva, MD On 06/28/2014. (at 11:45am at the [facility])    Specialty:  Obstetrics and Gynecology   Contact information:   [address]  [place] [id number] [phone]       Greater than thirty minutes were spend for face to face discharge instructions and discharge orders/summary in EPIC.   Signed: CROSS, MELISSA DEAL 06/15/2014, 9:27 AM

## 2014-06-15 NOTE — Progress Notes (Signed)
Patient voided 200 cc clear urine without difficulty.  States she doesn't have to pee anymore.  Peri care performed and shown to daughter at bedside.  Iced peri pad in place.

## 2014-06-17 ENCOUNTER — Telehealth: Payer: Self-pay | Admitting: Gynecologic Oncology

## 2014-06-17 NOTE — Telephone Encounter (Signed)
Called to check on patient's post-operative status.  Message left.

## 2014-06-17 NOTE — Telephone Encounter (Signed)
Spoke with patient's daughter.  Stating patient is doing well post-op.  No drainage or bleeding from her vulvar incision.   Notified of path results: precancer, no cancer.  Follow up appt arranged.  Reportable signs and symptoms reviewed.  Advised to call for any questions or concerns.

## 2014-06-17 NOTE — Telephone Encounter (Signed)
Message copied by Dorothyann Gibbs on Thu Jun 17, 2014  3:36 PM ------      Message from: Everitt Amber C      Created: Thu Jun 17, 2014  3:01 PM       Melissa,       Would you mind letting Ms Laske know that her pathology showed pre-cancer (dysplasia) and no cancer.      Thanks      Terrence Dupont ------

## 2014-06-25 ENCOUNTER — Ambulatory Visit: Payer: Medicare Other | Attending: Gynecologic Oncology | Admitting: Gynecologic Oncology

## 2014-06-25 ENCOUNTER — Encounter: Payer: Self-pay | Admitting: Gynecologic Oncology

## 2014-06-25 VITALS — BP 142/81 | HR 87 | Temp 98.1°F | Resp 18 | Ht 66.0 in | Wt 108.4 lb

## 2014-06-25 DIAGNOSIS — Z09 Encounter for follow-up examination after completed treatment for conditions other than malignant neoplasm: Secondary | ICD-10-CM | POA: Insufficient documentation

## 2014-06-25 DIAGNOSIS — D071 Carcinoma in situ of vulva: Secondary | ICD-10-CM | POA: Insufficient documentation

## 2014-06-25 DIAGNOSIS — N9089 Other specified noninflammatory disorders of vulva and perineum: Secondary | ICD-10-CM

## 2014-06-25 DIAGNOSIS — N903 Dysplasia of vulva, unspecified: Secondary | ICD-10-CM

## 2014-06-25 DIAGNOSIS — Z9079 Acquired absence of other genital organ(s): Secondary | ICD-10-CM | POA: Diagnosis not present

## 2014-06-25 NOTE — Progress Notes (Signed)
  HPI:  [name] is a 80 y.o. year old woman initially seen in consultation on 05/17/14 for VIN3 referred by Dr Elonda Husky.  She then underwent a modified radical anterior vulvectomy on 2/42/35 without complications.  Her postoperative course was uncomplicated.  Her final pathology revealed VIN3 with negative margins.  She is seen today for a postoperative check and to discuss her pathology results and ongoing plan.  Since discharge from the hospital, she is feeling well with no major complaints.  She has improving appetite, normal bowel and bladder function, and pain controlled with minimal PO medication. She has no other complaints today.    Review of systems: Constitutional:  She has no weight gain or weight loss. She has no fever or chills. Eyes: No blurred vision Ears, Nose, Mouth, Throat: No dizziness, headaches or changes in hearing. No mouth sores. Cardiovascular: No chest pain, palpitations or edema. Respiratory:  No shortness of breath, wheezing or cough Gastrointestinal: She has normal bowel movements without diarrhea or constipation. She denies any nausea or vomiting. She denies blood in her stool or heart burn. Genitourinary:  She denies pelvic pain, pelvic pressure or changes in her urinary function. She has no hematuria, dysuria, or incontinence. She has no irregular vaginal bleeding or vaginal discharge Musculoskeletal: Denies muscle weakness or joint pains.  Skin:  She has no skin changes, rashes or itching Neurological:  Denies dizziness or headaches. No neuropathy, no numbness or tingling. Psychiatric:  She denies depression or anxiety. Hematologic/Lymphatic:   No easy bruising or bleeding   Physical Exam: Blood pressure 142/81, pulse 87, temperature 98.1 F (36.7 C), temperature source Oral, resp. rate 18, height 5\' 6"  (1.676 m), weight 108 lb 6.4 oz (49.17 kg). General: Well dressed, well nourished in no apparent distress.   HEENT:  Normocephalic and atraumatic, no  lesions.  Extraocular muscles intact. Sclerae anicteric. Pupils equal, round, reactive. No mouth sores or ulcers. Thyroid is normal size, not nodular, midline. Skin:  No lesions or rashes. Breasts:  deferred Lungs:  Clear to auscultation bilaterally.  No wheezes. Cardiovascular:  Regular rate and rhythm.  No murmurs or rubs. Abdomen:  Soft, nontender, nondistended.  No palpable masses.  No hepatosplenomegaly.  No ascites. Normal bowel sounds.  No hernias.  In Genitourinary: Vulvar incision is healing well with sutures in tact and no surrounding erythema or signs of infection. Extremities: No cyanosis, clubbing or edema.  No calf tenderness or erythema. No palpable cords. Psychiatric: Mood and affect are appropriate. Neurological: Awake, alert and oriented x 3. Sensation is intact, no neuropathy.  Musculoskeletal: No pain, normal strength and range of motion.  Assessment:    80 y.o. year old with VIN3.   S/p modified radical anterior vulvectomy on 06/14/14.   Plan: 1) Pathology reports reviewed today 2) Treatment counseling - she is at a 25% risk for recurrence of VIN and we recommend 6 monthly surveillance for the appearance of new lesions. She was given the opportunity to ask questions, which were answered to her satisfaction, and she is agreement with the above mentioned plan of care. With respect to wound care, she is healing well from this wound and I recommend that she continue current methods to keep the area dry and clean.  3)  Return to clinic 6 months alternating 6 monthly with Dr Elonda Husky.  Donaciano Eva, MD

## 2014-06-25 NOTE — Patient Instructions (Signed)
Plan to see Dr. Denman George or Dr. Elonda Husky in six months or sooner if issues or concerns arise.

## 2014-06-28 ENCOUNTER — Ambulatory Visit: Payer: Medicare Other | Admitting: Gynecologic Oncology

## 2014-11-11 ENCOUNTER — Encounter (INDEPENDENT_AMBULATORY_CARE_PROVIDER_SITE_OTHER): Payer: Self-pay | Admitting: *Deleted

## 2014-12-22 ENCOUNTER — Ambulatory Visit: Payer: Medicare Other | Attending: Gynecologic Oncology | Admitting: Gynecologic Oncology

## 2014-12-22 ENCOUNTER — Encounter: Payer: Self-pay | Admitting: Gynecologic Oncology

## 2014-12-22 VITALS — BP 135/77 | HR 79 | Temp 97.8°F | Resp 16 | Ht 66.0 in | Wt 105.9 lb

## 2014-12-22 DIAGNOSIS — Z86008 Personal history of in-situ neoplasm of other site: Secondary | ICD-10-CM | POA: Diagnosis not present

## 2014-12-22 DIAGNOSIS — D071 Carcinoma in situ of vulva: Secondary | ICD-10-CM

## 2014-12-22 NOTE — Progress Notes (Signed)
FOLLOWUP EVALUATION.   Chief Complaint:  Chief Complaint  Patient presents with  . VIN III    Assessment:    81 y.o. year old with history of VIN3.   S/p modified radical anterior vulvectomy on 06/14/14. NED for recurrence on today's examination.  Plan: Reviewed the symptoms of recurrence. Recommended returning to clinic immediately for evaluation if these develop (discussed surgery is less radical for smaller, earlier identified lesions). Recommended 6 monthly surveillance with either myself or Dr Elonda Husky. The patient's daughter finds taking her to the doctor difficult and requests 12 monthly evaluations. I stated this was acceptable provided she returns sooner for any concerning new lesions or symptoms.  HPI:  [name] is a 81 y.o. year old woman initially seen in consultation on 05/17/14 for VIN3 referred by Dr Elonda Husky.  She then underwent a modified radical anterior vulvectomy on 5/78/46 without complications.  Her postoperative course was uncomplicated.  Her final pathology revealed VIN3 with negative margins.  She has no symptoms of recurrence with no pruritis, erythema, bleeding, lower extremity edema. She has no other complaints today.   Review of systems: Constitutional:  She has no weight gain or weight loss. She has no fever or chills. Eyes: No blurred vision Ears, Nose, Mouth, Throat: No dizziness, headaches or changes in hearing. No mouth sores. Cardiovascular: No chest pain, palpitations or edema. Respiratory:  No shortness of breath, wheezing or cough Gastrointestinal: She has normal bowel movements without diarrhea or constipation. She denies any nausea or vomiting. She denies blood in her stool or heart burn. Genitourinary:  She denies pelvic pain, pelvic pressure or changes in her urinary function. She has no hematuria, dysuria, or incontinence. She has no irregular vaginal bleeding or vaginal discharge Musculoskeletal: Denies muscle weakness or joint pains.  Skin:  She  has no skin changes, rashes or itching Neurological:  Denies dizziness or headaches. No neuropathy, no numbness or tingling. Psychiatric:  She denies depression or anxiety. Hematologic/Lymphatic:   No easy bruising or bleeding   Physical Exam: Blood pressure 135/77, pulse 79, temperature 97.8 F (36.6 C), temperature source Oral, resp. rate 16, height 5\' 6"  (1.676 m), weight 105 lb 14.4 oz (48.036 kg). General: Well dressed, well nourished in no apparent distress.   HEENT:  Normocephalic and atraumatic, no lesions.  Extraocular muscles intact. Sclerae anicteric. Pupils equal, round, reactive. No mouth sores or ulcers. Thyroid is normal size, not nodular, midline. Skin:  No lesions or rashes. Breasts:  deferred Lungs:  Clear to auscultation bilaterally.  No wheezes. Cardiovascular:  Regular rate and rhythm.  No murmurs or rubs. Abdomen:  Soft, nontender, nondistended.  No palpable masses.  No hepatosplenomegaly.  No ascites. Normal bowel sounds.  No hernias.  In Genitourinary: Vulvar incision is healed at anterior midline. 5% acetic acid soaked 4x4 guaze was used to apply. No acetowhite changes on the vulva noted. Vaginal tissues normal and free of lesions. Cervix and uterus surgically absent. Extremities: No cyanosis, clubbing or edema.  No calf tenderness or erythema. No palpable cords. Psychiatric: Mood and affect are appropriate. Neurological: Awake, alert and oriented x 3. Sensation is intact, no neuropathy.  Musculoskeletal: No pain, normal strength and range of motion.   Donaciano Eva, MD

## 2014-12-22 NOTE — Patient Instructions (Addendum)
We would like to see you back in our office in 6-12 months. Call 1-2 months before your preferred appointment date. Please keep regular appointments with Dr. Elonda Husky. Call with any questions or concerns, and monitor area of interest closely.

## 2015-02-16 ENCOUNTER — Ambulatory Visit (INDEPENDENT_AMBULATORY_CARE_PROVIDER_SITE_OTHER): Payer: Medicare Other | Admitting: Internal Medicine

## 2015-02-21 ENCOUNTER — Ambulatory Visit (INDEPENDENT_AMBULATORY_CARE_PROVIDER_SITE_OTHER): Payer: Medicare Other | Admitting: Internal Medicine

## 2015-02-22 ENCOUNTER — Ambulatory Visit (INDEPENDENT_AMBULATORY_CARE_PROVIDER_SITE_OTHER): Payer: Medicare Other | Admitting: Internal Medicine

## 2015-02-22 ENCOUNTER — Encounter (INDEPENDENT_AMBULATORY_CARE_PROVIDER_SITE_OTHER): Payer: Self-pay | Admitting: Internal Medicine

## 2015-02-22 VITALS — BP 132/70 | HR 64 | Temp 98.1°F | Ht 66.0 in | Wt 106.0 lb

## 2015-02-22 DIAGNOSIS — K589 Irritable bowel syndrome without diarrhea: Secondary | ICD-10-CM | POA: Diagnosis not present

## 2015-02-22 NOTE — Patient Instructions (Signed)
OV in 1 year.  

## 2015-02-22 NOTE — Progress Notes (Signed)
Subjective:    Patient ID: [name], female    DOB: [birth year], 81 y.o.   MRN: [id number]  HPI Here today for f/u of her IBS. She was last seen in May of 2015.  She tells me she is doing good. She occasionally has urgency. She had a skin cancer removed on her vulva and now is doing well. She usually has a BM daily and her daughter says she may skip a day.  Her daughter says she is taking the Metamucil as needed. Daughter says her mother does not drink enough water. No abdominal pain.  No melena or BRRB.  She lives next door to her daughter. Last colonoscopy 2006 which revealed patchy changes of colitis with punctate whitish areas which are resolving pseudomembranes. These changes extend from the rectum to the cecum. Pancolonic diverticulosis. Small external hermorrhoids. Biopsy: resolving c diff    Review of Systems Past Medical History  Diagnosis Date  . Clostridium difficile colitis     dating back to 2004  . Glaucoma   . Diverticulosis   . Diverticulitis   . Recurrent UTI   . Chronic constipation   . Nausea   . Dementia   . Hypertension   . Murmur, cardiac   . Gait instability     Uses Citron or cane toambulate    Past Surgical History  Procedure Laterality Date  . Orif right hip  2004    Dr.Harrison  . Bilateral eyelid surgery    . Colonoscopy  10/30/04    NUR  . Vulvectomy N/A 06/14/2014    Procedure: PARTIAL MODIFED ANTERIOR RADICAL VULVECTOMY ;  Surgeon: Everitt Amber, MD;  Location: [facility];  Service: Gynecology;  Laterality: N/A;    Allergies  Allergen Reactions  . Flagyl [Metronidazole Hcl]   . Other     Most antibiotics causes severe c. difficille.  . Ciprofloxacin Rash    Current Outpatient Prescriptions on File Prior to Visit  Medication Sig Dispense Refill  . acetaminophen (TYLENOL) 500 MG tablet Take 500 mg by mouth every 6 (six) hours as needed.      Marland Kitchen aspirin 81 MG tablet Take 81 mg by mouth daily.    . Glucosamine-Chondroitin (OSTEO BI-FLEX  REGULAR STRENGTH PO) Take 1 tablet by mouth daily.      Marland Kitchen latanoprost (XALATAN) 0.005 % ophthalmic solution Place 1 drop into both eyes at bedtime.     . Multiple Vitamins-Minerals (MULTIVITAMIN WITH MINERALS) tablet Take 1 tablet by mouth daily.      . Naphazoline-Polyethyl Glycol (EYE DROPS) 0.012-0.2 % SOLN Apply 1 drop to eye daily.     . Potassium Gluconate 595 MG CAPS Take 1 capsule by mouth daily.      . Probiotic Product (FLORA-Q PO) Take 1 tablet by mouth daily.      . psyllium (REGULOID) 0.52 G capsule Take by mouth daily.       No current facility-administered medications on file prior to visit.        Objective:   Physical ExamBlood pressure 132/70, pulse 64, temperature 98.1 F (36.7 C), height 5\' 6"  (1.676 m), weight 106 lb (48.081 kg).   Alert and oriented. Skin warm and dry. Oral mucosa is moist.   . Sclera anicteric, conjunctivae is pink. Thyroid not enlarged. No cervical lymphadenopathy. Lungs clear. Heart regular rate and rhythm.  Abdomen is soft. Bowel sounds are positive. No hepatomegaly. No abdominal masses felt. No tenderness.  No edema to lower extremities.  Assessment & Plan:  IBS. She is doing well. No constipation. Usually has a BM daily or every other day. No incontinence or urgency. She seems to be doing well. Daughter with patient today. OV in1 year,.

## 2015-04-02 ENCOUNTER — Encounter (HOSPITAL_COMMUNITY): Payer: Self-pay | Admitting: Emergency Medicine

## 2015-04-02 ENCOUNTER — Inpatient Hospital Stay (HOSPITAL_COMMUNITY)
Admission: EM | Admit: 2015-04-02 | Discharge: 2015-04-06 | DRG: 690 | Disposition: A | Discharge status: Skilled Nursing Facility | Payer: Medicare Other | Attending: Family Medicine | Admitting: Family Medicine

## 2015-04-02 ENCOUNTER — Emergency Department (HOSPITAL_COMMUNITY): Payer: Medicare Other

## 2015-04-02 DIAGNOSIS — B9689 Other specified bacterial agents as the cause of diseases classified elsewhere: Secondary | ICD-10-CM | POA: Diagnosis present

## 2015-04-02 DIAGNOSIS — D649 Anemia, unspecified: Secondary | ICD-10-CM | POA: Diagnosis present

## 2015-04-02 DIAGNOSIS — Z833 Family history of diabetes mellitus: Secondary | ICD-10-CM

## 2015-04-02 DIAGNOSIS — W19XXXA Unspecified fall, initial encounter: Secondary | ICD-10-CM | POA: Diagnosis present

## 2015-04-02 DIAGNOSIS — Z87440 Personal history of urinary (tract) infections: Secondary | ICD-10-CM

## 2015-04-02 DIAGNOSIS — R0902 Hypoxemia: Secondary | ICD-10-CM

## 2015-04-02 DIAGNOSIS — S32512A Fracture of superior rim of left pubis, initial encounter for closed fracture: Secondary | ICD-10-CM | POA: Diagnosis present

## 2015-04-02 DIAGNOSIS — I10 Essential (primary) hypertension: Secondary | ICD-10-CM | POA: Diagnosis present

## 2015-04-02 DIAGNOSIS — L8990 Pressure ulcer of unspecified site, unspecified stage: Secondary | ICD-10-CM | POA: Insufficient documentation

## 2015-04-02 DIAGNOSIS — I82409 Acute embolism and thrombosis of unspecified deep veins of unspecified lower extremity: Secondary | ICD-10-CM

## 2015-04-02 DIAGNOSIS — E871 Hypo-osmolality and hyponatremia: Secondary | ICD-10-CM | POA: Diagnosis present

## 2015-04-02 DIAGNOSIS — N390 Urinary tract infection, site not specified: Secondary | ICD-10-CM | POA: Diagnosis present

## 2015-04-02 DIAGNOSIS — F0390 Unspecified dementia without behavioral disturbance: Secondary | ICD-10-CM | POA: Diagnosis present

## 2015-04-02 DIAGNOSIS — Z66 Do not resuscitate: Secondary | ICD-10-CM | POA: Diagnosis present

## 2015-04-02 DIAGNOSIS — S329XXA Fracture of unspecified parts of lumbosacral spine and pelvis, initial encounter for closed fracture: Secondary | ICD-10-CM

## 2015-04-02 DIAGNOSIS — T148XXA Other injury of unspecified body region, initial encounter: Secondary | ICD-10-CM

## 2015-04-02 DIAGNOSIS — R609 Edema, unspecified: Secondary | ICD-10-CM

## 2015-04-02 DIAGNOSIS — R0600 Dyspnea, unspecified: Secondary | ICD-10-CM | POA: Diagnosis not present

## 2015-04-02 LAB — URINALYSIS, ROUTINE W REFLEX MICROSCOPIC
Bilirubin Urine: NEGATIVE
GLUCOSE, UA: NEGATIVE mg/dL
Ketones, ur: NEGATIVE mg/dL
NITRITE: POSITIVE — AB
Protein, ur: 30 mg/dL — AB
Specific Gravity, Urine: 1.01 (ref 1.005–1.030)
UROBILINOGEN UA: 0.2 mg/dL (ref 0.0–1.0)
pH: 8 (ref 5.0–8.0)

## 2015-04-02 LAB — CBC WITH DIFFERENTIAL/PLATELET
Basophils Absolute: 0 10*3/uL (ref 0.0–0.1)
Basophils Relative: 0 % (ref 0–1)
Eosinophils Absolute: 0 10*3/uL (ref 0.0–0.7)
Eosinophils Relative: 0 % (ref 0–5)
HCT: 37.1 % (ref 36.0–46.0)
HEMOGLOBIN: 12.4 g/dL (ref 12.0–15.0)
LYMPHS PCT: 4 % — AB (ref 12–46)
Lymphs Abs: 0.6 10*3/uL — ABNORMAL LOW (ref 0.7–4.0)
MCH: 30.3 pg (ref 26.0–34.0)
MCHC: 33.4 g/dL (ref 30.0–36.0)
MCV: 90.7 fL (ref 78.0–100.0)
MONO ABS: 0.6 10*3/uL (ref 0.1–1.0)
MONOS PCT: 4 % (ref 3–12)
NEUTROS ABS: 14.2 10*3/uL — AB (ref 1.7–7.7)
Neutrophils Relative %: 92 % — ABNORMAL HIGH (ref 43–77)
PLATELETS: 184 10*3/uL (ref 150–400)
RBC: 4.09 MIL/uL (ref 3.87–5.11)
RDW: 13.5 % (ref 11.5–15.5)
WBC: 15.4 10*3/uL — ABNORMAL HIGH (ref 4.0–10.5)

## 2015-04-02 LAB — BASIC METABOLIC PANEL
ANION GAP: 10 (ref 5–15)
BUN: 15 mg/dL (ref 6–20)
CHLORIDE: 93 mmol/L — AB (ref 101–111)
CO2: 28 mmol/L (ref 22–32)
Calcium: 9 mg/dL (ref 8.9–10.3)
Creatinine, Ser: 0.58 mg/dL (ref 0.44–1.00)
GFR calc non Af Amer: >60 mL/min (ref >60)
GLUCOSE: 98 mg/dL (ref 65–99)
Potassium: 3.9 mmol/L (ref 3.5–5.1)
Sodium: 131 mmol/L — ABNORMAL LOW (ref 135–145)

## 2015-04-02 LAB — D-DIMER, QUANTITATIVE: D-Dimer, Quant: >20 ug/mL-FEU — ABNORMAL HIGH (ref 0.00–0.48)

## 2015-04-02 LAB — URINE MICROSCOPIC-ADD ON

## 2015-04-02 LAB — BRAIN NATRIURETIC PEPTIDE: B NATRIURETIC PEPTIDE 5: 106 pg/mL — AB (ref 0.0–100.0)

## 2015-04-02 MED ORDER — ASPIRIN 81 MG PO CHEW
81.0000 mg | CHEWABLE_TABLET | Freq: Every day | ORAL | Status: DC
  Administered 2015-04-03 – 2015-04-06 (×4): 81 mg via ORAL
  Filled 2015-04-02 (×5): qty 1

## 2015-04-02 MED ORDER — LATANOPROST 0.005 % OP SOLN
OPHTHALMIC | Status: AC
  Filled 2015-04-02: qty 2.5

## 2015-04-02 MED ORDER — ACETAMINOPHEN 325 MG PO TABS: 650.0000 mg | ORAL_TABLET | Freq: Four times a day (QID) | ORAL | Status: DC | PRN

## 2015-04-02 MED ORDER — ASPIRIN 81 MG PO TABS: 81.0000 mg | ORAL_TABLET | Freq: Every day | ORAL | Status: DC

## 2015-04-02 MED ORDER — CETYLPYRIDINIUM CHLORIDE 0.05 % MT LIQD
7.0000 mL | Freq: Two times a day (BID) | OROMUCOSAL | Status: DC
  Administered 2015-04-02 – 2015-04-06 (×6): 7 mL via OROMUCOSAL

## 2015-04-02 MED ORDER — HYDROMORPHONE HCL 1 MG/ML IJ SOLN
0.5000 mg | Freq: Once | INTRAMUSCULAR | Status: AC
  Administered 2015-04-02: 0.5 mg via INTRAVENOUS
  Filled 2015-04-02: qty 1

## 2015-04-02 MED ORDER — ONDANSETRON HCL 4 MG/2ML IJ SOLN
4.0000 mg | Freq: Once | INTRAMUSCULAR | Status: AC
  Administered 2015-04-02: 4 mg via INTRAVENOUS
  Filled 2015-04-02: qty 2

## 2015-04-02 MED ORDER — ACETAMINOPHEN 650 MG RE SUPP: 650.0000 mg | Freq: Four times a day (QID) | RECTAL | Status: DC | PRN

## 2015-04-02 MED ORDER — CEFTRIAXONE SODIUM IN DEXTROSE 20 MG/ML IV SOLN
1.0000 g | INTRAVENOUS | Status: DC
  Filled 2015-04-02 (×2): qty 50

## 2015-04-02 MED ORDER — ONDANSETRON HCL 4 MG PO TABS: 4.0000 mg | ORAL_TABLET | Freq: Four times a day (QID) | ORAL | Status: DC | PRN

## 2015-04-02 MED ORDER — LATANOPROST 0.005 % OP SOLN
1.0000 [drp] | Freq: Every day | OPHTHALMIC | Status: DC
  Administered 2015-04-03 – 2015-04-05 (×3): 1 [drp] via OPHTHALMIC
  Filled 2015-04-02: qty 2.5

## 2015-04-02 MED ORDER — ACETAMINOPHEN 500 MG PO TABS: 500.0000 mg | ORAL_TABLET | Freq: Four times a day (QID) | ORAL | Status: DC | PRN

## 2015-04-02 MED ORDER — DEXTROSE 5 % IV SOLN
1.0000 g | Freq: Once | INTRAVENOUS | Status: AC
  Administered 2015-04-02: 1 g via INTRAVENOUS
  Filled 2015-04-02: qty 10

## 2015-04-02 MED ORDER — ENOXAPARIN SODIUM 40 MG/0.4ML ~~LOC~~ SOLN
40.0000 mg | SUBCUTANEOUS | Status: DC
  Administered 2015-04-03 – 2015-04-05 (×4): 40 mg via SUBCUTANEOUS
  Filled 2015-04-02 (×4): qty 0.4

## 2015-04-02 MED ORDER — ONDANSETRON HCL 4 MG/2ML IJ SOLN: 4.0000 mg | Freq: Four times a day (QID) | INTRAMUSCULAR | Status: DC | PRN

## 2015-04-02 MED ORDER — SODIUM CHLORIDE 0.9 % IV SOLN
INTRAVENOUS | Status: DC
  Administered 2015-04-03 (×2): via INTRAVENOUS

## 2015-04-02 MED ORDER — CEFTRIAXONE SODIUM IN DEXTROSE 20 MG/ML IV SOLN
1.0000 g | INTRAVENOUS | Status: DC
  Filled 2015-04-02: qty 50

## 2015-04-02 NOTE — ED Notes (Signed)
Placed pt on 2L nasal cannula. Sats 88-89%. Sats 93% with oxygen.

## 2015-04-02 NOTE — ED Notes (Signed)
Pt made aware a urine sample was needed. Pt and family member verbalized understanding.

## 2015-04-02 NOTE — Progress Notes (Signed)
ANTIBIOTIC CONSULT NOTE - INITIAL  Pharmacy Consult for Rocephin Indication: UTI  Allergies  Allergen Reactions  . Flagyl [Metronidazole Hcl]   . Other     Most antibiotics causes severe c. difficille.  . Ciprofloxacin Rash   Patient Measurements: Height: 5\' 7"  (170.2 cm) Weight: 105 lb 2.6 oz (47.7 kg) IBW/kg (Calculated) : 61.6  Vital Signs: Temp: 98 F (36.7 C) (07/02 2043) Temp Source: Oral (07/02 2043) BP: 135/51 mmHg (07/02 2043) Pulse Rate: 114 (07/02 2043) Intake/Output from previous day:   Intake/Output from this shift:    Labs:  Recent Labs  04/02/15 1602  WBC 15.4*  HGB 12.4  PLT 184  CREATININE 0.58   Estimated Creatinine Clearance: 38 mL/min (by C-G formula based on Cr of 0.58). No results for input(s): VANCOTROUGH, VANCOPEAK, VANCORANDOM, GENTTROUGH, GENTPEAK, GENTRANDOM, TOBRATROUGH, TOBRAPEAK, TOBRARND, AMIKACINPEAK, AMIKACINTROU, AMIKACIN in the last 72 hours.   Microbiology: No results found for this or any previous visit (from the past 720 hour(s)).  Medical History: Past Medical History  Diagnosis Date  . Clostridium difficile colitis     dating back to 2004  . Glaucoma   . Diverticulosis   . Diverticulitis   . Recurrent UTI   . Chronic constipation   . Nausea   . Dementia   . Hypertension   . Murmur, cardiac   . Gait instability     Uses Allums or cane toambulate   Anti-infectives    Start     Dose/Rate Route Frequency Ordered Stop   04/02/15 2300  cefTRIAXone (ROCEPHIN) 1 g in dextrose 5 % 50 mL IVPB - Premix     1 g 100 mL/hr over 30 Minutes Intravenous Every 24 hours 04/02/15 2156     04/02/15 1930  cefTRIAXone (ROCEPHIN) 1 g in dextrose 5 % 50 mL IVPB     1 g Intravenous  Once 04/02/15 1919 04/02/15 1938     Assessment: 81yo female presented to ED after a fall.  Pt found to have abnormal UA.  Asked to initiate Rocephin for suspected UTI.  Goal of Therapy:  Eradicate infection.  Plan:  Rocephin 1gm IV q24hrs Monitor  labs, progress, c/s  [place] Crane, Kenya Shiraishi A 04/02/2015,9:58 PM

## 2015-04-02 NOTE — ED Notes (Signed)
MD Zammit at bedside. 

## 2015-04-02 NOTE — H&P (Signed)
PCP:   Lanette Hampshire, MD   Chief Complaint:  Fall  HPI: 81 year old female who   has a past medical history of Clostridium difficile colitis; Glaucoma; Diverticulosis; Diverticulitis; Recurrent UTI; Chronic constipation; Nausea; Dementia; Hypertension; Murmur, cardiac; and Gait instability. Today was brought to the hospital after patient had a fall while time to get inside the house. Patient's daughter who was standing at patient's back could not hold the patient as she fell forward. Patient usually uses [place]. Patient denies dizziness, no passing out, no head injury. No other symptoms no nausea vomiting or diarrhea. No abdominal pain. No chest pain or shortness of breath. In the ED patient was found to have abnormal UA and CT of the left hip reveals strong suspicion for left pubic ramus fracture. Patient also found to be tachycardic with hypoxia O2 sats in the range of 88-90% on room air. Requiring 2 L of oxygen via nasal cannula. Allergies:   Allergies  Allergen Reactions  . Flagyl [Metronidazole Hcl]   . Other     Most antibiotics causes severe c. difficille.  . Ciprofloxacin Rash      Past Medical History  Diagnosis Date  . Clostridium difficile colitis     dating back to 2004  . Glaucoma   . Diverticulosis   . Diverticulitis   . Recurrent UTI   . Chronic constipation   . Nausea   . Dementia   . Hypertension   . Murmur, cardiac   . Gait instability     Uses [place] or cane toambulate    Past Surgical History  Procedure Laterality Date  . Orif right hip  2004    Dr.Harrison  . Bilateral eyelid surgery    . Colonoscopy  10/30/04    NUR  . Vulvectomy N/A 06/14/2014    Procedure: PARTIAL MODIFED ANTERIOR RADICAL VULVECTOMY ;  Surgeon: Everitt Amber, MD;  Location: [facility];  Service: Gynecology;  Laterality: N/A;    Prior to Admission medications   Medication Sig Start Date End Date Taking? Authorizing Provider  acetaminophen (TYLENOL) 500 MG tablet Take 500 mg by  mouth every 6 (six) hours as needed. Takes 3 a day   Yes Historical Provider, MD  aspirin 81 MG tablet Take 81 mg by mouth daily.   Yes Historical Provider, MD  Carboxymethylcell-Hypromellose (GENTEAL OP) Place 1 drop into both eyes as needed (Dry Eyes).   Yes Historical Provider, MD  Glucosamine-Chondroitin (OSTEO BI-FLEX REGULAR STRENGTH PO) Take 1 tablet by mouth daily.     Yes Historical Provider, MD  latanoprost (XALATAN) 0.005 % ophthalmic solution Place 1 drop into both eyes at bedtime.    Yes Historical Provider, MD  Multiple Vitamins-Minerals (MULTIVITAMIN WITH MINERALS) tablet Take 1 tablet by mouth daily.     Yes Historical Provider, MD  Potassium Gluconate 595 MG CAPS Take 1 capsule by mouth daily.     Yes Historical Provider, MD  Probiotic Product (FLORA-Q PO) Take by mouth. Three a week   Yes Historical Provider, MD  psyllium (REGULOID) 0.52 G capsule Take by mouth as needed.     Historical Provider, MD    Social History:  reports that she has never smoked. She has never used smokeless tobacco. She reports that she does not drink alcohol or use illicit drugs.  Family History  Problem Relation Age of Onset  . Diabetes type II      Filed Weights   04/02/15 1416  Weight: 47.628 kg (105 lb)    All  the positives are listed in BOLD  Review of Systems:  HEENT: Headache, blurred vision, runny nose, sore throat Neck: Hypothyroidism, hyperthyroidism,,lymphadenopathy Chest : Shortness of breath, history of COPD, Asthma Heart : Chest pain, history of coronary arterey disease GI:  Nausea, vomiting, diarrhea, constipation, GERD GU: Dysuria, urgency, frequency of urination, hematuria Neuro: Stroke, seizures, syncope Psych: Depression, anxiety, hallucinations   Physical Exam: Blood pressure 105/61, pulse 110, temperature 97.5 F (36.4 C), temperature source Oral, resp. rate 14, height 5\' 7"  (1.702 m), weight 47.628 kg (105 lb), SpO2 95 %. Constitutional:   Patient is a  well-developed and well-nourished female* in no acute distress and cooperative with exam. Head: Normocephalic and atraumatic Mouth: Mucus membranes moist Eyes: PERRL, EOMI, conjunctivae normal Neck: Supple, No Thyromegaly Cardiovascular: RRR, S1 normal, S2 normal Pulmonary/Chest: CTAB, no wheezes, rales, or rhonchi Abdominal: Soft. Non-tender, non-distended, bowel sounds are normal, no masses, organomegaly, or guarding present.  Neurological: A&O x3, Strength is normal and symmetric bilaterally, cranial nerve II-XII are grossly intact, no focal motor deficit, sensory intact to light touch bilaterally.  Extremities : No Cyanosis, Clubbing or Edema  Labs on Admission:  Basic Metabolic Panel:  Recent Labs Lab 04/02/15 1602  NA 131*  K 3.9  CL 93*  CO2 28  GLUCOSE 98  BUN 15  CREATININE 0.58  CALCIUM 9.0   Liver Function Tests: No results for input(s): AST, ALT, ALKPHOS, BILITOT, PROT, ALBUMIN in the last 168 hours. No results for input(s): LIPASE, AMYLASE in the last 168 hours. No results for input(s): AMMONIA in the last 168 hours. CBC:  Recent Labs Lab 04/02/15 1602  WBC 15.4*  NEUTROABS 14.2*  HGB 12.4  HCT 37.1  MCV 90.7  PLT 184    Radiological Exams on Admission: Ct Hip Left Wo Contrast  04/02/2015   CLINICAL DATA:  [place] today, walking out the front door reaching for a [place]. Left hip pain with movement.  EXAM: CT OF THE LEFT HIP WITHOUT CONTRAST  TECHNIQUE: Multidetector CT imaging of the left hip was performed according to the standard protocol. Multiplanar CT image reconstructions were also generated.  COMPARISON:  Radiography same day.  FINDINGS: I have reviewed this case with Dr. Rozetta Nunnery of our [facility] division. We believe that there is a nondisplaced fracture of the superior ramus on the left. No inferior ramus fracture is seen. No fracture of the femur or acetabulum. Chronic osteoarthritis of the left hip is noted.  IMPRESSION: Strong suspicion of a nondisplaced  fracture of the superior ramus on the left.   Electronically Signed   By: Nelson Chimes M.D.   On: 04/02/2015 17:39   Dg Chest Portable 1 View  04/02/2015   CLINICAL DATA:  Tachycardia. Fall with left hip pain. Hypertension. Demented.  EXAM: PORTABLE CHEST - 1 VIEW  COMPARISON:  06/11/2014  FINDINGS: Numerous leads and wires project over the chest. High-riding right worse than left humeral heads, consistent with chronic rotator cuff insufficiency. Midline trachea. Moderate cardiomegaly with transverse aortic atherosclerosis. No right and no definite left pleural effusion. No pneumothorax. Artifacts project over the lung apices bilaterally. No congestive failure. Vague increased density projecting over the right lung base laterally.  IMPRESSION: No acute or posttraumatic deformity identified.  Cardiomegaly and aortic atherosclerosis.  Favor artifactual density projecting over the right lung base. If there are respiratory symptoms, repeat radiograph could be performed to exclude pneumonia or aspiration.   Electronically Signed   By: Abigail Miyamoto M.D.   On: 04/02/2015 19:50  Dg Hip Unilat With Pelvis 2-3 Views Left  04/02/2015   CLINICAL DATA:  Fall. Left hip pain according to the patient. Witnessed fall walking out the front door. Fell reaching for a [place]. Injury a few hr ago. Personal history of dementia.  EXAM: LEFT HIP (WITH PELVIS) 2-3 VIEWS  COMPARISON:  None.  FINDINGS: The bones are osteopenic. There has been bipolar hemiarthroplasty on the right. No complications seen relative to that. On the left, there is degenerative arthritis with joint space narrowing and circumferential osteophytes. No hip fracture seen on the left. The bones are quite osteopenic, but I suspect there is at least a superior ramus fracture on the left.  IMPRESSION: Suspicion of superior ramus fracture on the left. The bones are quite osteopenic and difficult to evaluate well.  Previous bipolar hemiarthroplasty on the right without  apparent complication.  Osteoarthritis of the left hip.   Electronically Signed   By: Nelson Chimes M.D.   On: 04/02/2015 16:06      Assessment/Plan Active Problems:   UTI (lower urinary tract infection)   Pelvic fracture  UTI We'll admit the patient for UTI, start Rocephin. Follow urine culture results.  Superior ramus fracture CT of the hip shows superior ramus fracture on the left, will consult orthopedics in a.m. Patient will need rehabilitation placement for strengthening as patient is unsteady on her feet.  Hypoxia ? cause, patient's O2 sats in the range of 88% on room air. She denies shortness of breath. No history of COPD. Patient also found to be tachycardic, will obtain 2-D echocardiogram, check d-dimer. Follow chest x-ray. Continue oxygen via nasal cannula.  DVT prophylaxis Lovenox  Code status: DO NOT RESUSCITATE  Family discussion: Discussed with patient's daughter at bedside   Time Spent on Admission: 60 minutes  [facility] Pager: [phone] 04/02/2015, 8:01 PM  If 7PM-7AM, please contact night-coverage  [email or web address]  Password [email or web address]

## 2015-04-02 NOTE — ED Notes (Signed)
Hospitalist at bedside 

## 2015-04-02 NOTE — ED Provider Notes (Signed)
CSN: [id number]     Arrival date & time 04/02/15  1408 History   First MD Initiated Contact with Patient 04/02/15 1429     Chief Complaint  Patient presents with  . Fall     (Consider location/radiation/quality/duration/timing/severity/associated sxs/prior Treatment) Patient is a 81 y.o. female presenting with fall. The history is provided by a relative (the pt has left hip pain and fell while walking.  her daughter saw her fall.  no head trauma).  Fall This is a new problem. The current episode started 6 to 12 hours ago. The problem occurs constantly. The problem has not changed since onset.Pertinent negatives include no chest pain, no abdominal pain and no headaches. Exacerbated by: standing. Nothing relieves the symptoms.    Past Medical History  Diagnosis Date  . Clostridium difficile colitis     dating back to 2004  . Glaucoma   . Diverticulosis   . Diverticulitis   . Recurrent UTI   . Chronic constipation   . Nausea   . Dementia   . Hypertension   . Murmur, cardiac   . Gait instability     Uses [place] or cane toambulate   Past Surgical History  Procedure Laterality Date  . Orif right hip  2004    Dr.Harrison  . Bilateral eyelid surgery    . Colonoscopy  10/30/04    NUR  . Vulvectomy N/A 06/14/2014    Procedure: PARTIAL MODIFED ANTERIOR RADICAL VULVECTOMY ;  Surgeon: Everitt Amber, MD;  Location: [facility];  Service: Gynecology;  Laterality: N/A;   Family History  Problem Relation Age of Onset  . Diabetes type II     History  Substance Use Topics  . Smoking status: Never Smoker   . Smokeless tobacco: Never Used  . Alcohol Use: No   OB History    No data available     Review of Systems  Constitutional: Negative for appetite change and fatigue.  HENT: Negative for congestion, ear discharge and sinus pressure.   Eyes: Negative for discharge.  Respiratory: Negative for cough.   Cardiovascular: Negative for chest pain.  Gastrointestinal: Negative for abdominal  pain and diarrhea.  Genitourinary: Negative for frequency and hematuria.  Musculoskeletal: Negative for back pain.       Left hip pain  Skin: Negative for rash.  Neurological: Negative for seizures and headaches.  Psychiatric/Behavioral: Negative for hallucinations.      Allergies  Flagyl; Other; and Ciprofloxacin  Home Medications   Prior to Admission medications   Medication Sig Start Date End Date Taking? Authorizing Provider  acetaminophen (TYLENOL) 500 MG tablet Take 500 mg by mouth every 6 (six) hours as needed. Takes 3 a day   Yes Historical Provider, MD  aspirin 81 MG tablet Take 81 mg by mouth daily.   Yes Historical Provider, MD  Carboxymethylcell-Hypromellose (GENTEAL OP) Place 1 drop into both eyes as needed (Dry Eyes).   Yes Historical Provider, MD  Glucosamine-Chondroitin (OSTEO BI-FLEX REGULAR STRENGTH PO) Take 1 tablet by mouth daily.     Yes Historical Provider, MD  latanoprost (XALATAN) 0.005 % ophthalmic solution Place 1 drop into both eyes at bedtime.    Yes Historical Provider, MD  Multiple Vitamins-Minerals (MULTIVITAMIN WITH MINERALS) tablet Take 1 tablet by mouth daily.     Yes Historical Provider, MD  Potassium Gluconate 595 MG CAPS Take 1 capsule by mouth daily.     Yes Historical Provider, MD  Probiotic Product (FLORA-Q PO) Take by mouth. Three a week  Yes Historical Provider, MD  psyllium (REGULOID) 0.52 G capsule Take by mouth as needed.     Historical Provider, MD   BP 103/63 mmHg  Pulse 108  Temp(Src) 97.5 F (36.4 C) (Oral)  Resp 11  Ht 5\' 7"  (1.702 m)  Wt 105 lb (47.628 kg)  BMI 16.44 kg/m2  SpO2 91% Physical Exam  Constitutional: She is oriented to person, place, and time. She appears well-developed.  HENT:  Head: Normocephalic.  Eyes: Conjunctivae and EOM are normal. No scleral icterus.  Neck: Neck supple. No thyromegaly present.  Cardiovascular: Normal rate and regular rhythm.  Exam reveals no gallop and no friction rub.   No murmur  heard. Pulmonary/Chest: No stridor. She has no wheezes. She has no rales. She exhibits no tenderness.  Abdominal: She exhibits no distension. There is no tenderness. There is no rebound.  Musculoskeletal: Normal range of motion. She exhibits tenderness. She exhibits no edema.  Tender left hip  Lymphadenopathy:    She has no cervical adenopathy.  Neurological: She is oriented to person, place, and time. She exhibits normal muscle tone. Coordination normal.  Skin: No rash noted. No erythema.  Psychiatric: She has a normal mood and affect. Her behavior is normal.    ED Course  Procedures (including critical care time) Labs Review Labs Reviewed  CBC WITH DIFFERENTIAL/PLATELET - Abnormal; Notable for the following:    WBC 15.4 (*)    Neutrophils Relative % 92 (*)    Neutro Abs 14.2 (*)    Lymphocytes Relative 4 (*)    Lymphs Abs 0.6 (*)    All other components within normal limits  BASIC METABOLIC PANEL - Abnormal; Notable for the following:    Sodium 131 (*)    Chloride 93 (*)    All other components within normal limits  URINALYSIS, ROUTINE W REFLEX MICROSCOPIC (NOT AT [facility]) - Abnormal; Notable for the following:    APPearance HAZY (*)    Hgb urine dipstick TRACE (*)    Protein, ur 30 (*)    Nitrite POSITIVE (*)    Leukocytes, UA MODERATE (*)    All other components within normal limits  URINE MICROSCOPIC-ADD ON - Abnormal; Notable for the following:    Bacteria, UA MANY (*)    All other components within normal limits  URINE CULTURE    Imaging Review Ct Hip Left Wo Contrast  04/02/2015   CLINICAL DATA:  [place] today, walking out the front door reaching for a [place]. Left hip pain with movement.  EXAM: CT OF THE LEFT HIP WITHOUT CONTRAST  TECHNIQUE: Multidetector CT imaging of the left hip was performed according to the standard protocol. Multiplanar CT image reconstructions were also generated.  COMPARISON:  Radiography same day.  FINDINGS: I have reviewed this case with Dr. Rozetta Nunnery of our [facility] division. We believe that there is a nondisplaced fracture of the superior ramus on the left. No inferior ramus fracture is seen. No fracture of the femur or acetabulum. Chronic osteoarthritis of the left hip is noted.  IMPRESSION: Strong suspicion of a nondisplaced fracture of the superior ramus on the left.   Electronically Signed   By: Nelson Chimes M.D.   On: 04/02/2015 17:39   Dg Hip Unilat With Pelvis 2-3 Views Left  04/02/2015   CLINICAL DATA:  Fall. Left hip pain according to the patient. Witnessed fall walking out the front door. Fell reaching for a [place]. Injury a few hr ago. Personal history of dementia.  EXAM:  LEFT HIP (WITH PELVIS) 2-3 VIEWS  COMPARISON:  None.  FINDINGS: The bones are osteopenic. There has been bipolar hemiarthroplasty on the right. No complications seen relative to that. On the left, there is degenerative arthritis with joint space narrowing and circumferential osteophytes. No hip fracture seen on the left. The bones are quite osteopenic, but I suspect there is at least a superior ramus fracture on the left.  IMPRESSION: Suspicion of superior ramus fracture on the left. The bones are quite osteopenic and difficult to evaluate well.  Previous bipolar hemiarthroplasty on the right without apparent complication.  Osteoarthritis of the left hip.   Electronically Signed   By: Nelson Chimes M.D.   On: 04/02/2015 16:06     EKG Interpretation   Date/Time:  Saturday April 02 2015 14:35:04 EDT Ventricular Rate:  108 PR Interval:  155 QRS Duration: 98 QT Interval:  357 QTC Calculation: 478 R Axis:   -63 Text Interpretation:  Sinus tachycardia Supraventricular bigeminy Left  anterior fascicular block Abnormal R-wave progression, early transition  Probable LVH with secondary repol abnrm Confirmed by Paulmichael Schreck  MD, Adilene Areola  [phone]) on 04/02/2015 2:46:28 PM      MDM   Final diagnoses:  Fracture, pelvis closed, initial encounter  UTI (lower urinary tract  infection)    Admit for uti and superior rami fx on left    Milton Ferguson, MD 04/02/15 [place]ly [place]

## 2015-04-02 NOTE — ED Notes (Signed)
Gave pt Sprite to drink with admitting MD approval.

## 2015-04-02 NOTE — ED Notes (Addendum)
Per EMS, pt from home where she had a witnessed fall walking out of front door. Pt states she slid reaching for [place]. Denies LOC, dizziness, or lightheadedness. Pt c/o of LT hip pain with movement. No deformity, shortening, or rotation noted. Pt denies any pain when lying down. Denies neck or back pain. AO x 4. Pt has hx of memory loss and AFib. NAD noted. VSS.

## 2015-04-02 NOTE — ED Notes (Addendum)
Pt's daughter has come to nurses station multiple times during pt's visit complaining that things are moving to slowly. Daughter upset that cafeteria does not bring meal trays at this time. Informed daughter that we have graham crackers, daughter refused. Pt and family have been informed of the process and re-assured that everything is being done to make them more comfortable and in a timely fashion. Pt's daughter walked away cursing.

## 2015-04-03 ENCOUNTER — Inpatient Hospital Stay (HOSPITAL_COMMUNITY): Payer: Medicare Other

## 2015-04-03 DIAGNOSIS — R0600 Dyspnea, unspecified: Secondary | ICD-10-CM

## 2015-04-03 DIAGNOSIS — L8990 Pressure ulcer of unspecified site, unspecified stage: Secondary | ICD-10-CM | POA: Insufficient documentation

## 2015-04-03 LAB — COMPREHENSIVE METABOLIC PANEL
ALK PHOS: 60 U/L (ref 38–126)
ALT: 16 U/L (ref 14–54)
AST: 25 U/L (ref 15–41)
Albumin: 4 g/dL (ref 3.5–5.0)
Anion gap: 10 (ref 5–15)
BUN: 21 mg/dL — ABNORMAL HIGH (ref 6–20)
CO2: 27 mmol/L (ref 22–32)
Calcium: 8.6 mg/dL — ABNORMAL LOW (ref 8.9–10.3)
Chloride: 93 mmol/L — ABNORMAL LOW (ref 101–111)
Creatinine, Ser: 0.73 mg/dL (ref 0.44–1.00)
GFR calc Af Amer: >60 mL/min (ref >60)
GFR calc non Af Amer: >60 mL/min (ref >60)
Glucose, Bld: 146 mg/dL — ABNORMAL HIGH (ref 65–99)
Potassium: 4.1 mmol/L (ref 3.5–5.1)
SODIUM: 130 mmol/L — AB (ref 135–145)
Total Bilirubin: 0.8 mg/dL (ref 0.3–1.2)
Total Protein: 6.3 g/dL — ABNORMAL LOW (ref 6.5–8.1)

## 2015-04-03 LAB — CBC
HCT: 35.6 % — ABNORMAL LOW (ref 36.0–46.0)
Hemoglobin: 11.7 g/dL — ABNORMAL LOW (ref 12.0–15.0)
MCH: 30.2 pg (ref 26.0–34.0)
MCHC: 32.9 g/dL (ref 30.0–36.0)
MCV: 92 fL (ref 78.0–100.0)
Platelets: 200 10*3/uL (ref 150–400)
RBC: 3.87 MIL/uL (ref 3.87–5.11)
RDW: 13.7 % (ref 11.5–15.5)
WBC: 14.6 10*3/uL — AB (ref 4.0–10.5)

## 2015-04-03 MED ORDER — IOHEXOL 350 MG/ML SOLN
100.0000 mL | Freq: Once | INTRAVENOUS | Status: AC | PRN
  Administered 2015-04-03: 100 mL via INTRAVENOUS

## 2015-04-03 MED ORDER — DEXTROSE 5 % IV SOLN
1.0000 g | INTRAVENOUS | Status: DC
  Administered 2015-04-03 – 2015-04-05 (×3): 1 g via INTRAVENOUS
  Filled 2015-04-03 (×4): qty 10

## 2015-04-03 NOTE — Progress Notes (Signed)
0829 Regular diet d/c and soft diet ordered per daughter's request d/t patient's inability to chew hard foods. MD aware.

## 2015-04-03 NOTE — Progress Notes (Signed)
Patient admitted yesterday with superior ramus fracture, also had hypoxia. Followed up on the labs today including d-dimer which was elevated greater than 20.0, echocardiogram showed increased pulmonary pressure was mild right ventricular dilation. Called and discussed with Dr. Legrand Rams covering for Dr. Everette Rank, who agreed with getting CT angiogram chest. CT angiogram chest is negative for PE. Due to concern for thromboembolism with markedly elevated d-dimer I will obtain bilateral venous duplex. Follow bilateral lower extremity venous Doppler in a.m.

## 2015-04-03 NOTE — Progress Notes (Signed)
0746 Reported to Dr.McInnis that patient's RIGHT little finger is bruised and swollen. New order given as follows: 1.) Obtain STAT RIGHT little finger x-ray x 1 time only

## 2015-04-03 NOTE — Progress Notes (Signed)
  Echocardiogram 2D Echocardiogram has been performed.  Lysle Rubens 04/03/2015, 3:30 PM

## 2015-04-03 NOTE — Consult Note (Signed)
Reason for Consult:Fracture left superior pubic ramis Referring Physician: Hosptialist  [name] is an 81 y.o. female.  HPI: Patient had a fall yesterday while being assisted by relative.  Patient has cane and also uses a Gewirtz at times.  She is post bipolar hip several years ago by Dr. Aline Brochure.  She also sees Dr. Aline Brochure for right shoulder pain and arthritis.  After her fall, she had pain in the left hip area.  X-rays and CT show small nondisplaced fracture of the left superior pubic ramis.  She hit her right hand but has no fracture.    Past Medical History  Diagnosis Date  . Clostridium difficile colitis     dating back to 2004  . Glaucoma   . Diverticulosis   . Diverticulitis   . Recurrent UTI   . Chronic constipation   . Nausea   . Dementia   . Hypertension   . Murmur, cardiac   . Gait instability     Uses Blatt or cane toambulate    Past Surgical History  Procedure Laterality Date  . Orif right hip  2004    Dr.Harrison  . Bilateral eyelid surgery    . Colonoscopy  10/30/04    NUR  . Vulvectomy N/A 06/14/2014    Procedure: PARTIAL MODIFED ANTERIOR RADICAL VULVECTOMY ;  Surgeon: Everitt Amber, MD;  Location: [facility];  Service: Gynecology;  Laterality: N/A;    Family History  Problem Relation Age of Onset  . Diabetes type II      Social History:  reports that she has never smoked. She has never used smokeless tobacco. She reports that she does not drink alcohol or use illicit drugs.  Allergies:  Allergies  Allergen Reactions  . Flagyl [Metronidazole Hcl]   . Other     Most antibiotics causes severe c. difficille.  . Ciprofloxacin Rash    Medications: I have reviewed the patient's current medications.  Results for orders placed or performed during the hospital encounter of 04/02/15 (from the past 48 hour(s))  D-dimer, quantitative (not at [facility])     Status: Abnormal   Collection Time: 04/02/15  3:57 PM  Result Value Ref Range   D-Dimer, Quant >20.00 (H)  0.00 - 0.48 ug/mL-FEU    Comment:        AT THE INHOUSE ESTABLISHED CUTOFF VALUE OF 0.48 ug/mL FEU, THIS ASSAY HAS BEEN DOCUMENTED IN THE LITERATURE TO HAVE A SENSITIVITY AND NEGATIVE PREDICTIVE VALUE OF AT LEAST 98 TO 99%.  THE TEST RESULT SHOULD BE CORRELATED WITH AN ASSESSMENT OF THE CLINICAL PROBABILITY OF DVT / VTE.   Brain natriuretic peptide     Status: Abnormal   Collection Time: 04/02/15  3:57 PM  Result Value Ref Range   B Natriuretic Peptide 106.0 (H) 0.0 - 100.0 pg/mL  CBC with Differential/Platelet     Status: Abnormal   Collection Time: 04/02/15  4:02 PM  Result Value Ref Range   WBC 15.4 (H) 4.0 - 10.5 K/uL   RBC 4.09 3.87 - 5.11 MIL/uL   Hemoglobin 12.4 12.0 - 15.0 g/dL   HCT 37.1 36.0 - 46.0 %   MCV 90.7 78.0 - 100.0 fL   MCH 30.3 26.0 - 34.0 pg   MCHC 33.4 30.0 - 36.0 g/dL   RDW 13.5 11.5 - 15.5 %   Platelets 184 150 - 400 K/uL   Neutrophils Relative % 92 (H) 43 - 77 %   Neutro Abs 14.2 (H) 1.7 - 7.7 K/uL  Lymphocytes Relative 4 (L) 12 - 46 %   Lymphs Abs 0.6 (L) 0.7 - 4.0 K/uL   Monocytes Relative 4 3 - 12 %   Monocytes Absolute 0.6 0.1 - 1.0 K/uL   Eosinophils Relative 0 0 - 5 %   Eosinophils Absolute 0.0 0.0 - 0.7 K/uL   Basophils Relative 0 0 - 1 %   Basophils Absolute 0.0 0.0 - 0.1 K/uL  Basic metabolic panel     Status: Abnormal   Collection Time: 04/02/15  4:02 PM  Result Value Ref Range   Sodium 131 (L) 135 - 145 mmol/L   Potassium 3.9 3.5 - 5.1 mmol/L   Chloride 93 (L) 101 - 111 mmol/L   CO2 28 22 - 32 mmol/L   Glucose, Bld 98 65 - 99 mg/dL   BUN 15 6 - 20 mg/dL   Creatinine, Ser 0.58 0.44 - 1.00 mg/dL   Calcium 9.0 8.9 - 10.3 mg/dL   GFR calc non Af Amer >60 >60 mL/min   GFR calc Af Amer >60 >60 mL/min    Comment: (NOTE) The eGFR has been calculated using the CKD EPI equation. This calculation has not been validated in all clinical situations. eGFR's persistently <60 mL/min signify possible Chronic Kidney Disease.    Anion gap 10  5 - 15  Urinalysis, Routine w reflex microscopic (not at [facility])     Status: Abnormal   Collection Time: 04/02/15  7:00 PM  Result Value Ref Range   Color, Urine YELLOW YELLOW   APPearance HAZY (A) CLEAR   Specific Gravity, Urine 1.010 1.005 - 1.030   pH 8.0 5.0 - 8.0   Glucose, UA NEGATIVE NEGATIVE mg/dL   Hgb urine dipstick TRACE (A) NEGATIVE   Bilirubin Urine NEGATIVE NEGATIVE   Ketones, ur NEGATIVE NEGATIVE mg/dL   Protein, ur 30 (A) NEGATIVE mg/dL   Urobilinogen, UA 0.2 0.0 - 1.0 mg/dL   Nitrite POSITIVE (A) NEGATIVE   Leukocytes, UA MODERATE (A) NEGATIVE  Urine microscopic-add on     Status: Abnormal   Collection Time: 04/02/15  7:00 PM  Result Value Ref Range   Squamous Epithelial / LPF RARE RARE   WBC, UA TOO NUMEROUS TO COUNT <3 WBC/hpf   RBC / HPF 3-6 <3 RBC/hpf   Bacteria, UA MANY (A) RARE  CBC     Status: Abnormal   Collection Time: 04/03/15  6:12 AM  Result Value Ref Range   WBC 14.6 (H) 4.0 - 10.5 K/uL   RBC 3.87 3.87 - 5.11 MIL/uL   Hemoglobin 11.7 (L) 12.0 - 15.0 g/dL   HCT 35.6 (L) 36.0 - 46.0 %   MCV 92.0 78.0 - 100.0 fL   MCH 30.2 26.0 - 34.0 pg   MCHC 32.9 30.0 - 36.0 g/dL   RDW 13.7 11.5 - 15.5 %   Platelets 200 150 - 400 K/uL  Comprehensive metabolic panel     Status: Abnormal   Collection Time: 04/03/15  6:12 AM  Result Value Ref Range   Sodium 130 (L) 135 - 145 mmol/L   Potassium 4.1 3.5 - 5.1 mmol/L   Chloride 93 (L) 101 - 111 mmol/L   CO2 27 22 - 32 mmol/L   Glucose, Bld 146 (H) 65 - 99 mg/dL   BUN 21 (H) 6 - 20 mg/dL   Creatinine, Ser 0.73 0.44 - 1.00 mg/dL   Calcium 8.6 (L) 8.9 - 10.3 mg/dL   Total Protein 6.3 (L) 6.5 - 8.1 g/dL   Albumin 4.0  3.5 - 5.0 g/dL   AST 25 15 - 41 U/L   ALT 16 14 - 54 U/L   Alkaline Phosphatase 60 38 - 126 U/L   Total Bilirubin 0.8 0.3 - 1.2 mg/dL   GFR calc non Af Amer >60 >60 mL/min   GFR calc Af Amer >60 >60 mL/min    Comment: (NOTE) The eGFR has been calculated using the CKD EPI equation. This calculation  has not been validated in all clinical situations. eGFR's persistently <60 mL/min signify possible Chronic Kidney Disease.    Anion gap 10 5 - 15    Ct Hip Left Wo Contrast  04/02/2015   CLINICAL DATA:  [place] today, walking out the front door reaching for a Dosanjh. Left hip pain with movement.  EXAM: CT OF THE LEFT HIP WITHOUT CONTRAST  TECHNIQUE: Multidetector CT imaging of the left hip was performed according to the standard protocol. Multiplanar CT image reconstructions were also generated.  COMPARISON:  Radiography same day.  FINDINGS: I have reviewed this case with Dr. Rozetta Nunnery of our [facility] division. We believe that there is a nondisplaced fracture of the superior ramus on the left. No inferior ramus fracture is seen. No fracture of the femur or acetabulum. Chronic osteoarthritis of the left hip is noted.  IMPRESSION: Strong suspicion of a nondisplaced fracture of the superior ramus on the left.   Electronically Signed   By: Nelson Chimes M.D.   On: 04/02/2015 17:39   Dg Chest Portable 1 View  04/02/2015   CLINICAL DATA:  Tachycardia. Fall with left hip pain. Hypertension. Demented.  EXAM: PORTABLE CHEST - 1 VIEW  COMPARISON:  06/11/2014  FINDINGS: Numerous leads and wires project over the chest. High-riding right worse than left humeral heads, consistent with chronic rotator cuff insufficiency. Midline trachea. Moderate cardiomegaly with transverse aortic atherosclerosis. No right and no definite left pleural effusion. No pneumothorax. Artifacts project over the lung apices bilaterally. No congestive failure. Vague increased density projecting over the right lung base laterally.  IMPRESSION: No acute or posttraumatic deformity identified.  Cardiomegaly and aortic atherosclerosis.  Favor artifactual density projecting over the right lung base. If there are respiratory symptoms, repeat radiograph could be performed to exclude pneumonia or aspiration.   Electronically Signed   By: Abigail Miyamoto M.D.   On:  04/02/2015 19:50   Dg Finger Little Right  04/03/2015   CLINICAL DATA:  Right fall yesterday, bruising to right level finger.  EXAM: RIGHT LITTLE FINGER 2+V  COMPARISON:  None.  FINDINGS: Advanced degenerative joint disease in the DIP joint of the right little finger. Moderate degenerative changes in the remaining visualized IP joints. No fracture, subluxation or dislocation. Soft tissues are intact.  IMPRESSION: Degenerative changes in the IP joints, most pronounced in the right fifth DIP joint. No visible acute bony abnormality.   Electronically Signed   By: Rolm Baptise M.D.   On: 04/03/2015 08:45   Dg Hip Unilat With Pelvis 2-3 Views Left  04/02/2015   CLINICAL DATA:  Fall. Left hip pain according to the patient. Witnessed fall walking out the front door. Fell reaching for a Cona. Injury a few hr ago. Personal history of dementia.  EXAM: LEFT HIP (WITH PELVIS) 2-3 VIEWS  COMPARISON:  None.  FINDINGS: The bones are osteopenic. There has been bipolar hemiarthroplasty on the right. No complications seen relative to that. On the left, there is degenerative arthritis with joint space narrowing and circumferential osteophytes. No hip fracture seen on the  left. The bones are quite osteopenic, but I suspect there is at least a superior ramus fracture on the left.  IMPRESSION: Suspicion of superior ramus fracture on the left. The bones are quite osteopenic and difficult to evaluate well.  Previous bipolar hemiarthroplasty on the right without apparent complication.  Osteoarthritis of the left hip.   Electronically Signed   By: Nelson Chimes M.D.   On: 04/02/2015 16:06    Review of Systems  Gastrointestinal:       IBS  Genitourinary:       UTI  Musculoskeletal: Positive for falls (fell and hurt the left hip area.  Also hurt her right little finger.).       Prior fracture of the right hip with bipolar placement and also pain of the right shoulder and some mild diffuse joint pains.  Psychiatric/Behavioral:        History of dementia.   Blood pressure 99/62, pulse 98, temperature 97.7 F (36.5 C), temperature source Oral, resp. rate 16, height $RemoveBe'5\' 7"'[id number]$  (1.702 m), weight 47.7 kg (105 lb 2.6 oz), SpO2 95 %. Physical Exam  Constitutional: She is oriented to person, place, and time. She appears well-developed and well-nourished.  HENT:  Head: Normocephalic and atraumatic.  Eyes: Conjunctivae are normal. Pupils are equal, round, and reactive to light.  Neck: Normal range of motion. Neck supple.  Cardiovascular: Normal rate, regular rhythm and intact distal pulses.   Respiratory: Effort normal.  GI: Soft.  Musculoskeletal: She exhibits tenderness (Tender with left hip motion but NV intact.  Right little finger and lateral hand with contusion but full ROM and NV intact.).  Neurological: She is alert and oriented to person, place, and time. She has normal reflexes.  Skin: Skin is warm and dry.  Psychiatric: She has a normal mood and affect. Her behavior is normal.  Slightly confused.    Assessment/Plan: Nondisplaced fracture of the left superior pubic ramus.    She may need SNF placement.  Physical therapy ordered.  She can use Vullo and put weight on the left hip as tolerated.  I can follow in office in one week or she can be seen by Dr. Aline Brochure then as she has been followed by him regularly.  Continue enoxaparin for at least two weeks.  [name] 04/03/2015, 10:51 AM

## 2015-04-03 NOTE — Progress Notes (Signed)
Dr. Darrick Meigs phoned and stated there was an elevated D Dimer lab on pt. Requested MD on call for Dr. Everette Rank, which was Dr. Legrand Rams. Dr. Josephine Cables number was given and a follow-up page was sent as well as the number given to Dr. Darrick Meigs. Will continue to monitor pt and awaiting new orders.

## 2015-04-03 NOTE — Progress Notes (Signed)
Subjective: The patient is comfortable but mentally confused this a.m. She is admitted through the ED following a fall at home and has evidence of nondisplaced pubic ramus fracture. She was also felt to have urinary tract infection and is been started on IV antibody  Objective: Vital signs in last 24 hours: Temp:  [97.5 F (36.4 C)-98 F (36.7 C)] 97.7 F (36.5 C) (07/03 0604) Pulse Rate:  [92-129] 98 (07/03 0604) Resp:  [11-25] 16 (07/03 0604) BP: (99-135)/(51-87) 99/62 mmHg (07/03 0604) SpO2:  [90 %-98 %] 95 % (07/03 0604) Weight:  [47.628 kg (105 lb)-47.7 kg (105 lb 2.6 oz)] 47.7 kg (105 lb 2.6 oz) (07/02 2043) Weight change:  Last BM Date:  (unknown)  Intake/Output from previous day: 07/02 0701 - 07/03 0700 In: 192.5 [I.V.:192.5] Out: 285 [Urine:285] Intake/Output this shift:    Physical Exam: Gen. appearance patient somewhat confused  HEENT negative  Neck supple no JVD or thyroid abnormalities  Heart regular rhythm no murmurs  Abdomen no palpable organs or masses slight tenderness over pubic area   Recent Labs  04/02/15 1602 04/03/15 0612  WBC 15.4* 14.6*  HGB 12.4 11.7*  HCT 37.1 35.6*  PLT 184 200   BMET  Recent Labs  04/02/15 1602 04/03/15 0612  NA 131* 130*  K 3.9 4.1  CL 93* 93*  CO2 28 27  GLUCOSE 98 146*  BUN 15 21*  CREATININE 0.58 0.73  CALCIUM 9.0 8.6*    Studies/Results: Ct Hip Left Wo Contrast  04/02/2015   CLINICAL DATA:  [place] today, walking out the front door reaching for a Xue. Left hip pain with movement.  EXAM: CT OF THE LEFT HIP WITHOUT CONTRAST  TECHNIQUE: Multidetector CT imaging of the left hip was performed according to the standard protocol. Multiplanar CT image reconstructions were also generated.  COMPARISON:  Radiography same day.  FINDINGS: I have reviewed this case with Dr. Rozetta Nunnery of our [facility] division. We believe that there is a nondisplaced fracture of the superior ramus on the left. No inferior ramus fracture is  seen. No fracture of the femur or acetabulum. Chronic osteoarthritis of the left hip is noted.  IMPRESSION: Strong suspicion of a nondisplaced fracture of the superior ramus on the left.   Electronically Signed   By: Nelson Chimes M.D.   On: 04/02/2015 17:39   Dg Chest Portable 1 View  04/02/2015   CLINICAL DATA:  Tachycardia. Fall with left hip pain. Hypertension. Demented.  EXAM: PORTABLE CHEST - 1 VIEW  COMPARISON:  06/11/2014  FINDINGS: Numerous leads and wires project over the chest. High-riding right worse than left humeral heads, consistent with chronic rotator cuff insufficiency. Midline trachea. Moderate cardiomegaly with transverse aortic atherosclerosis. No right and no definite left pleural effusion. No pneumothorax. Artifacts project over the lung apices bilaterally. No congestive failure. Vague increased density projecting over the right lung base laterally.  IMPRESSION: No acute or posttraumatic deformity identified.  Cardiomegaly and aortic atherosclerosis.  Favor artifactual density projecting over the right lung base. If there are respiratory symptoms, repeat radiograph could be performed to exclude pneumonia or aspiration.   Electronically Signed   By: Abigail Miyamoto M.D.   On: 04/02/2015 19:50   Dg Hip Unilat With Pelvis 2-3 Views Left  04/02/2015   CLINICAL DATA:  Fall. Left hip pain according to the patient. Witnessed fall walking out the front door. Fell reaching for a Slotnick. Injury a few hr ago. Personal history of dementia.  EXAM: LEFT HIP (  WITH PELVIS) 2-3 VIEWS  COMPARISON:  None.  FINDINGS: The bones are osteopenic. There has been bipolar hemiarthroplasty on the right. No complications seen relative to that. On the left, there is degenerative arthritis with joint space narrowing and circumferential osteophytes. No hip fracture seen on the left. The bones are quite osteopenic, but I suspect there is at least a superior ramus fracture on the left.  IMPRESSION: Suspicion of superior ramus  fracture on the left. The bones are quite osteopenic and difficult to evaluate well.  Previous bipolar hemiarthroplasty on the right without apparent complication.  Osteoarthritis of the left hip.   Electronically Signed   By: Nelson Chimes M.D.   On: 04/02/2015 16:06    Medications:  . antiseptic oral rinse  7 mL Mouth Rinse BID  . aspirin  81 mg Oral Daily  . cefTRIAXone (ROCEPHIN)  IV  1 g Intravenous Q24H  . enoxaparin (LOVENOX) injection  40 mg Subcutaneous Q24H  . latanoprost  1 drop Both Eyes QHS    . sodium chloride 75 mL/hr at 04/03/15 0318     Assessment/Plan: 1. Patient had a fall and has pubic ramus fracture-plan to obtain orthopedic consult  2. Urinary tract infection-plan to continue IV Rocephin will continue to monitor  3. Hyponatremia-continue to monitor continue IV fluids   LOS: 1 day   Hebe Merriwether G 04/03/2015, 7:54 AM

## 2015-04-04 ENCOUNTER — Inpatient Hospital Stay (HOSPITAL_COMMUNITY): Payer: Medicare Other

## 2015-04-04 LAB — BASIC METABOLIC PANEL
ANION GAP: 8 (ref 5–15)
BUN: 22 mg/dL — ABNORMAL HIGH (ref 6–20)
CALCIUM: 8.2 mg/dL — AB (ref 8.9–10.3)
CHLORIDE: 97 mmol/L — AB (ref 101–111)
CO2: 27 mmol/L (ref 22–32)
Creatinine, Ser: 0.6 mg/dL (ref 0.44–1.00)
GFR calc non Af Amer: >60 mL/min (ref >60)
Glucose, Bld: 129 mg/dL — ABNORMAL HIGH (ref 65–99)
POTASSIUM: 4.4 mmol/L (ref 3.5–5.1)
Sodium: 132 mmol/L — ABNORMAL LOW (ref 135–145)

## 2015-04-04 MED ORDER — SODIUM CHLORIDE 0.9 % IV SOLN: INTRAVENOUS | Status: DC

## 2015-04-04 MED ORDER — FUROSEMIDE 20 MG PO TABS
20.0000 mg | ORAL_TABLET | Freq: Every day | ORAL | Status: DC
  Administered 2015-04-04 – 2015-04-06 (×3): 20 mg via ORAL
  Filled 2015-04-04 (×3): qty 1

## 2015-04-04 MED ORDER — POTASSIUM CHLORIDE CRYS ER 20 MEQ PO TBCR
20.0000 meq | EXTENDED_RELEASE_TABLET | Freq: Two times a day (BID) | ORAL | Status: DC
  Administered 2015-04-04 – 2015-04-06 (×5): 20 meq via ORAL
  Filled 2015-04-04 (×5): qty 1

## 2015-04-04 NOTE — Care Management Note (Signed)
Case Management Note  Patient Details  Name: [name] MRN: [id number] Date of Birth: Jan 10, 1926  Expected Discharge Date:                  Expected Discharge Plan:  [place]  In-House Referral:  Clinical Social Work  Discharge planning Services  CM Consult  Post Acute Care Choice:  NA Choice offered to:  NA  DME Arranged:    DME Agency:     HH Arranged:    [place] Agency:     Status of Service:  In process, will continue to follow  Medicare Important Message Given:    Date Medicare IM Given:    Medicare IM give by:    Date Additional Medicare IM Given:    Additional Medicare Important Message give by:     If discussed at [place] of Stay Meetings, dates discussed:    Additional Comments: Patient is from home and admitted for UTI and fx to her suprapubic ramus. Anticipate need for SNF at DC. CSW aware and will see patient to begin placement. No CM needs anticipated. Will cont to follow.  Sherald Barge, RN 04/04/2015, 11:53 AM

## 2015-04-04 NOTE — Progress Notes (Signed)
Subjective: The patient is more alert today but still somewhat confused. She was admitted through the ED following a fall at home and has evidence of nondisplaced superior pubic ramus fracture. She does have ongoing urinary tract infection. She did have elevated d-dimer CT angiogram of chest did not show evidence of PE. She is to have a venous Doppler ultrasound today.  Objective: Vital signs in last 24 hours: Temp:  [97.6 F (36.4 C)-98.1 F (36.7 C)] 97.6 F (36.4 C) (07/04 0541) Pulse Rate:  [95-109] 109 (07/04 0541) Resp:  [16] 16 (07/04 0541) BP: (91-133)/(63-64) 133/63 mmHg (07/04 0542) SpO2:  [94 %-98 %] 94 % (07/04 0541) Weight change:  Last BM Date:  (unknown)  Intake/Output from previous day: 07/03 0701 - 07/04 0700 In: 2152.5 [P.O.:320; I.V.:1782.5; IV Piggyback:50] Out: 600 [Urine:600] Intake/Output this shift: Total I/O In: 1832.5 [I.V.:1782.5; IV Piggyback:50] Out: 400 [Urine:400]  Physical Exam: General appearance patient remains somewhat confused  HEENT negative  Neck supple no JVD or thyroid abnormalities  Heart regular rhythm no murmurs  Abdomen no palpable organs or masses slight tenderness over the area  Extremities 2+ edema   Recent Labs  04/02/15 1602 04/03/15 0612  WBC 15.4* 14.6*  HGB 12.4 11.7*  HCT 37.1 35.6*  PLT 184 200   BMET  Recent Labs  04/02/15 1602 04/03/15 0612  NA 131* 130*  K 3.9 4.1  CL 93* 93*  CO2 28 27  GLUCOSE 98 146*  BUN 15 21*  CREATININE 0.58 0.73  CALCIUM 9.0 8.6*    Studies/Results: Ct Angio Chest Pe W/cm &/or Wo Cm  04/03/2015   CLINICAL DATA:  Fall yesterday with increased right shoulder pain and shortness of breath.  EXAM: CT ANGIOGRAPHY CHEST WITH CONTRAST  TECHNIQUE: Multidetector CT imaging of the chest was performed using the standard protocol during bolus administration of intravenous contrast. Multiplanar CT image reconstructions and MIPs were obtained to evaluate the vascular anatomy.  CONTRAST:   153mL OMNIPAQUE IOHEXOL 350 MG/ML SOLN  COMPARISON:  None.  FINDINGS: THORACIC INLET/BODY WALL:  Extensive dystrophic calcification in the right breast.  Sub cm bilateral thyroid nodules with mild dystrophic calcification on the right.  MEDIASTINUM:  Chronic cardiomegaly. Chronic enlargement of the main pulmonary artery to 36 mm, compatible pulmonary hypertension. No pericardial effusion. No acute vascular abnormality, including pulmonary embolism or aortic dissection. No adenopathy.  LUNG WINDOWS:  Subpleural sharply-circumscribed opacities in the lingula and medial left lower lobe compatible with atelectasis. No convincing pneumonia. Subtle diffuse interlobular septal thickening. No concerning pulmonary nodule.  UPPER ABDOMEN:  Partly visible 10 cm cyst from the right kidney.  No acute findings.  OSSEOUS:  No acute fracture.  No suspicious lytic or blastic lesions.  Review of the MIP images confirms the above findings.  IMPRESSION: 1. Negative for pulmonary embolism. 2. Subsegmental lingular and left lower lobe atelectasis. 3. Mild septal thickening, likely developing pulmonary edema.   Electronically Signed   By: Monte Fantasia M.D.   On: 04/03/2015 23:21   Ct Hip Left Wo Contrast  04/02/2015   CLINICAL DATA:  [place] today, walking out the front door reaching for a [place]. Left hip pain with movement.  EXAM: CT OF THE LEFT HIP WITHOUT CONTRAST  TECHNIQUE: Multidetector CT imaging of the left hip was performed according to the standard protocol. Multiplanar CT image reconstructions were also generated.  COMPARISON:  Radiography same day.  FINDINGS: I have reviewed this case with Dr. Rozetta Nunnery of our [facility] division. We believe  that there is a nondisplaced fracture of the superior ramus on the left. No inferior ramus fracture is seen. No fracture of the femur or acetabulum. Chronic osteoarthritis of the left hip is noted.  IMPRESSION: Strong suspicion of a nondisplaced fracture of the superior ramus on the left.    Electronically Signed   By: Nelson Chimes M.D.   On: 04/02/2015 17:39   Dg Chest Portable 1 View  04/02/2015   CLINICAL DATA:  Tachycardia. Fall with left hip pain. Hypertension. Demented.  EXAM: PORTABLE CHEST - 1 VIEW  COMPARISON:  06/11/2014  FINDINGS: Numerous leads and wires project over the chest. High-riding right worse than left humeral heads, consistent with chronic rotator cuff insufficiency. Midline trachea. Moderate cardiomegaly with transverse aortic atherosclerosis. No right and no definite left pleural effusion. No pneumothorax. Artifacts project over the lung apices bilaterally. No congestive failure. Vague increased density projecting over the right lung base laterally.  IMPRESSION: No acute or posttraumatic deformity identified.  Cardiomegaly and aortic atherosclerosis.  Favor artifactual density projecting over the right lung base. If there are respiratory symptoms, repeat radiograph could be performed to exclude pneumonia or aspiration.   Electronically Signed   By: Abigail Miyamoto M.D.   On: 04/02/2015 19:50   Dg Finger Little Right  04/03/2015   CLINICAL DATA:  Right fall yesterday, bruising to right level finger.  EXAM: RIGHT LITTLE FINGER 2+V  COMPARISON:  None.  FINDINGS: Advanced degenerative joint disease in the DIP joint of the right little finger. Moderate degenerative changes in the remaining visualized IP joints. No fracture, subluxation or dislocation. Soft tissues are intact.  IMPRESSION: Degenerative changes in the IP joints, most pronounced in the right fifth DIP joint. No visible acute bony abnormality.   Electronically Signed   By: Rolm Baptise M.D.   On: 04/03/2015 08:45   Dg Hip Unilat With Pelvis 2-3 Views Left  04/02/2015   CLINICAL DATA:  Fall. Left hip pain according to the patient. Witnessed fall walking out the front door. Fell reaching for a [place]. Injury a few hr ago. Personal history of dementia.  EXAM: LEFT HIP (WITH PELVIS) 2-3 VIEWS  COMPARISON:  None.  FINDINGS:  The bones are osteopenic. There has been bipolar hemiarthroplasty on the right. No complications seen relative to that. On the left, there is degenerative arthritis with joint space narrowing and circumferential osteophytes. No hip fracture seen on the left. The bones are quite osteopenic, but I suspect there is at least a superior ramus fracture on the left.  IMPRESSION: Suspicion of superior ramus fracture on the left. The bones are quite osteopenic and difficult to evaluate well.  Previous bipolar hemiarthroplasty on the right without apparent complication.  Osteoarthritis of the left hip.   Electronically Signed   By: Nelson Chimes M.D.   On: 04/02/2015 16:06    Medications:  . antiseptic oral rinse  7 mL Mouth Rinse BID  . aspirin  81 mg Oral Daily  . cefTRIAXone (ROCEPHIN)  IV  1 [name] Intravenous Q24H  . enoxaparin (LOVENOX) injection  40 mg Subcutaneous Q24H  . latanoprost  1 drop Both Eyes QHS    . sodium chloride 75 mL/hr at 04/03/15 1626     Assessment/Plan: 1. Patient had fallen has nondisplaced suprapubic ramus fracture-but apparently this will be treated conservatively patient will require skilled nursing facility  2. Urinary tract infection-plan to continue IV Rocephin continue to monitor  3. Hyponatremia-repeat be met today   LOS: 2 days  [name] 04/04/2015, 6:42 AM

## 2015-04-04 NOTE — Clinical Social Work Placement (Signed)
   CLINICAL SOCIAL WORK PLACEMENT  NOTE  Date:  04/04/2015  Patient Details  Name: CARLETA WOODROW MRN: [id number] Date of Birth: Feb 06, 1926  Clinical Social Work is seeking post-discharge placement for this patient at the [place] level of care (*CSW will initial, date and re-position this form in  chart as items are completed):  Yes   Patient/family provided with [place] Work Department's list of facilities offering this level of care within the geographic area requested by the patient (or if unable, by the patient's family).  Yes   Patient/family informed of their freedom to choose among providers that offer the needed level of care, that participate in Medicare, Medicaid or managed care program needed by the patient, have an available bed and are willing to accept the patient.  Yes   Patient/family informed of [facility]'s ownership interest in [facility] and [facility], as well as of the fact that they are under no obligation to receive care at these facilities.  PASRR submitted to EDS on 04/04/15     PASRR number received on 04/04/15     Existing PASRR number confirmed on       FL2 transmitted to all facilities in geographic area requested by pt/family on 04/04/15     FL2 transmitted to all facilities within larger geographic area on       Patient informed that his/her managed care company has contracts with or will negotiate with certain facilities, including the following:            Patient/family informed of bed offers received.  Patient chooses bed at       Physician recommends and patient chooses bed at      Patient to be transferred to   on  .  Patient to be transferred to facility by       Patient family notified on   of transfer.  Name of family member notified:        PHYSICIAN       Additional Comment:    _______________________________________________ Ihor Gully, LCSW 04/04/2015, 1:28  PM [phone]

## 2015-04-04 NOTE — Progress Notes (Signed)
Daughter phoned and wanted an update on mother. Dr. Darrick Meigs spoke with daughter regarding progress and CT Angiogram ordered.

## 2015-04-04 NOTE — Clinical Social Work Note (Signed)
Clinical Social Work Assessment  Patient Details  Name: [name] MRN: [id number] Date of Birth: 1925-12-13  Date of referral:  04/04/15               Reason for consult:  Facility Placement                Permission sought to share information with:  Family Supports (Daughter, [name], was at bedside and provided the history. ) Permission granted to share information::  Yes, Verbal Permission Granted  Name::     [name], daughter (also listed on patient's chart)  Agency::     Relationship::     Contact Information:     Housing/Transportation Living arrangements for the past 2 months:  Single Family Home Source of Information:  Patient, Adult Children Patient Interpreter Needed:  None Criminal Activity/Legal Involvement Pertinent to Current Situation/Hospitalization:  No - Comment as needed Significant Relationships:  Adult Children Lives with:  Self Do you feel safe going back to the place where you live?  Yes Need for family participation in patient care:  Yes (Comment)  Care giving concerns:  Patient lives alone, however her daugher and son-in-law live next door.  They check on patient throughout the day and assist her as needed.     Social Worker assessment / plan: CSW met with patient. [name], daughter, was at bedside.  Ms. Tamala Julian advised that patient uses a [place] when in her home and a cane with one person assist when out in the community. She advised that she assist patient with bathing and that patient dis able to complete other ADL's independently.  CSW discussed SNF placement for rehab purposes due to patient's current injury.  Ms. Tamala Julian advised that patient would need to go to rehab.  She indicated that patient had previously been at [facility] for rehab and that would be their first choice.  She advised that she wanted patient to stay in the [place] are, so [facility] would be their second choice.  CSW provided patient and daughter with a SNF list.      Employment status:  Retired Forensic scientist:  Medicare PT Recommendations:  [place] / Referral to community resources:  [place]  Patient/Family's Response to care:  Patient is agreeable to go to SNF for rehab purposes.   Patient/Family's Understanding of and Emotional Response to Diagnosis, Current Treatment, and Prognosis: Patient is somewhat confused.  Patient's daughter understands patients diagnosis, treatment and prognosis.    Emotional Assessment Appearance:  Developmentally appropriate Attitude/Demeanor/Rapport:   (Cooperative) Affect (typically observed):  Calm Orientation:  Oriented to Self Alcohol / Substance use:  Not Applicable Psych involvement (Current and /or in the community):  No (Comment)  Discharge Needs  Concerns to be addressed:  Discharge Planning Concerns Readmission within the last 30 days:  No Current discharge risk:  None Barriers to Discharge:  No Barriers Identified   Ihor Gully, LCSW 04/04/2015, 12:47 PM  [phone]

## 2015-04-04 NOTE — Progress Notes (Signed)
Patient did not receive full evaluation today due to "strict bed rest" order remaining in chart. However PT did stop in patient's room and educated patient regarding when PT will officially start (as soon as bed rest order is removed) and also provided education on possible need for stay in SNF, depending on how official initial evaluation goes. Daughter present throughout.   Patient and her daughter stated that the patient has been using a [place] inside her house and will sometimes use a cane and [facility] outside of the house. They also state she has a transfer chair, but no grab bars. Patient does have stairs to enter her home and has fallen on these stairs in the past even with external assistance.   Per RN, today's Doppler was clear.   PT to check back on 04/05/15 to see if patient is able to participate in full evaluation.  Deniece Ree PT, DPT [phone]

## 2015-04-04 NOTE — Plan of Care (Signed)
Problem: Phase I Progression Outcomes Goal: OOB as tolerated unless otherwise ordered Outcome: Not Progressing Pt has not been getting out of bed during this shift. Pt took a bed bath and has been using the bedpan. Goal: Vital Signs stable- temperature less than 102 Outcome: Progressing See flowsheet, temperature 98.1. Will continue to monitor.

## 2015-04-05 NOTE — Progress Notes (Signed)
Subjective: The patient is resting comfortably this morning. She is being treated for nondisplaced superior pubic ramus fracture. She has been seen by orthopedist and conservative care recommended. She did have slightly elevated d-dimer on admission CT angiogram of chest did not show evidence of PE she did have a venous Doppler ultrasound sound which was negative. She has ongoing urinary tract infection treated with Rocephin  Objective: Vital signs in last 24 hours: Temp:  [97.5 F (36.4 C)-98.2 F (36.8 C)] 97.5 F (36.4 C) (07/04 2202) Pulse Rate:  [72-96] 72 (07/04 2202) Resp:  [16] 16 (07/04 2202) BP: (104-117)/(43-57) 117/43 mmHg (07/04 2202) SpO2:  [98 %-99 %] 99 % (07/04 2202) Weight change:  Last BM Date:  (unknown)  Intake/Output from previous day: 07/04 0701 - 07/05 0700 In: 484 [P.O.:360; I.V.:74; IV Piggyback:50] Out: 500 [Urine:500] Intake/Output this shift:    Physical Exam: Gen. appearance the patient is drowsy  HEENT negative  Neck supple no JVD or thyroid abnormalities  Heart regular rhythm no murmurs  Abdomen no palpable organs or masses slight tenderness over lower abdomen  Extremities 1-2+ edema   Recent Labs  04/02/15 1602 04/03/15 0612  WBC 15.4* 14.6*  HGB 12.4 11.7*  HCT 37.1 35.6*  PLT 184 200   BMET  Recent Labs  04/03/15 0612 04/04/15 0757  NA 130* 132*  K 4.1 4.4  CL 93* 97*  CO2 27 27  GLUCOSE 146* 129*  BUN 21* 22*  CREATININE 0.73 0.60  CALCIUM 8.6* 8.2*    Studies/Results: Ct Angio Chest Pe W/cm &/or Wo Cm  04/03/2015   CLINICAL DATA:  Fall yesterday with increased right shoulder pain and shortness of breath.  EXAM: CT ANGIOGRAPHY CHEST WITH CONTRAST  TECHNIQUE: Multidetector CT imaging of the chest was performed using the standard protocol during bolus administration of intravenous contrast. Multiplanar CT image reconstructions and MIPs were obtained to evaluate the vascular anatomy.  CONTRAST:  152mL OMNIPAQUE IOHEXOL  350 MG/ML SOLN  COMPARISON:  None.  FINDINGS: THORACIC INLET/BODY WALL:  Extensive dystrophic calcification in the right breast.  Sub cm bilateral thyroid nodules with mild dystrophic calcification on the right.  MEDIASTINUM:  Chronic cardiomegaly. Chronic enlargement of the main pulmonary artery to 36 mm, compatible pulmonary hypertension. No pericardial effusion. No acute vascular abnormality, including pulmonary embolism or aortic dissection. No adenopathy.  LUNG WINDOWS:  Subpleural sharply-circumscribed opacities in the lingula and medial left lower lobe compatible with atelectasis. No convincing pneumonia. Subtle diffuse interlobular septal thickening. No concerning pulmonary nodule.  UPPER ABDOMEN:  Partly visible 10 cm cyst from the right kidney.  No acute findings.  OSSEOUS:  No acute fracture.  No suspicious lytic or blastic lesions.  Review of the MIP images confirms the above findings.  IMPRESSION: 1. Negative for pulmonary embolism. 2. Subsegmental lingular and left lower lobe atelectasis. 3. Mild septal thickening, likely developing pulmonary edema.   Electronically Signed   By: Monte Fantasia M.D.   On: 04/03/2015 23:21   US Venous Img Lower Bilateral  04/04/2015   CLINICAL DATA:  Bilateral swelling x2 days, pain, varicose/ spider veins  EXAM: BILATERAL LOWER EXTREMITY VENOUS DOPPLER ULTRASOUND  TECHNIQUE: Gray-scale sonography with compression, as well as color and duplex ultrasound, were performed to evaluate the deep venous system from the level of the common femoral vein through the popliteal and proximal calf veins.  COMPARISON:  10/04/2003  FINDINGS: Normal compressibility of the common femoral, superficial femoral, and popliteal veins, as well as the proximal calf  veins. No filling defects to suggest DVT on grayscale or color Doppler imaging. Doppler waveforms show normal direction of venous flow, normal respiratory phasicity and response to augmentation. Visualized segments of the saphenous  venous system normal in caliber and compressibility.  IMPRESSION: 1. No evidence of lower extremity deep vein thrombosis, bilaterally.   Electronically Signed   By: Lucrezia Europe M.D.   On: 04/04/2015 10:19   Dg Finger Little Right  04/03/2015   CLINICAL DATA:  Right fall yesterday, bruising to right level finger.  EXAM: RIGHT LITTLE FINGER 2+V  COMPARISON:  None.  FINDINGS: Advanced degenerative joint disease in the DIP joint of the right little finger. Moderate degenerative changes in the remaining visualized IP joints. No fracture, subluxation or dislocation. Soft tissues are intact.  IMPRESSION: Degenerative changes in the IP joints, most pronounced in the right fifth DIP joint. No visible acute bony abnormality.   Electronically Signed   By: Rolm Baptise M.D.   On: 04/03/2015 08:45    Medications:  . antiseptic oral rinse  7 mL Mouth Rinse BID  . aspirin  81 mg Oral Daily  . cefTRIAXone (ROCEPHIN)  IV  1 [name] Intravenous Q24H  . enoxaparin (LOVENOX) injection  40 mg Subcutaneous Q24H  . furosemide  20 mg Oral Daily  . latanoprost  1 drop Both Eyes QHS  . potassium chloride  20 mEq Oral BID    . sodium chloride 10 mL/hr at 04/04/15 1036     Assessment/Plan: 1. Suprapubic ramus fracture following fall-patient will require skilled facility will start physical therapy  2. Urinary tract infection to continue IV Rocephin will change medications if needed once organism is identified  3. Hypoechoic nature anemia to repeat met 7 today   LOS: 3 days   [name] 04/05/2015, 5:49 AM

## 2015-04-05 NOTE — Clinical Social Work Placement (Signed)
   CLINICAL SOCIAL WORK PLACEMENT  NOTE  Date:  04/05/2015  Patient Details  Name: [name] MRN: [id number] Date of Birth: Apr 23, 1926  Clinical Social Work is seeking post-discharge placement for this patient at the [place] level of care (*CSW will initial, date and re-position this form in  chart as items are completed):  Yes   Patient/family provided with [place] Work Department's list of facilities offering this level of care within the geographic area requested by the patient (or if unable, by the patient's family).  Yes   Patient/family informed of their freedom to choose among providers that offer the needed level of care, that participate in Medicare, Medicaid or managed care program needed by the patient, have an available bed and are willing to accept the patient.  Yes   Patient/family informed of [facility]'s ownership interest in [facility] and [facility], as well as of the fact that they are under no obligation to receive care at these facilities.  PASRR submitted to EDS on 04/04/15     PASRR number received on 04/04/15     Existing PASRR number confirmed on       FL2 transmitted to all facilities in geographic area requested by pt/family on 04/04/15     FL2 transmitted to all facilities within larger geographic area on       Patient informed that his/her managed care company has contracts with or will negotiate with certain facilities, including the following:        Yes   Patient/family informed of bed offers received.  Patient [facility] bed at [facility]     Physician recommends and patient chooses bed at      Patient to be transferred to   on  .  Patient to be transferred to facility by       Patient family notified on   of transfer.  Name of family member notified:        PHYSICIAN       Additional Comment:    _______________________________________________ Ihor Gully,  LCSW 04/05/2015, 11:13 AM

## 2015-04-05 NOTE — Evaluation (Signed)
Physical Therapy Evaluation Patient Details Name: [name] MRN: [id number] DOB: 11-Dec-1925 Today's Date: 04/05/2015   History of Present Illness  81 year old female who  has a past medical history of Clostridium difficile colitis; Glaucoma; Diverticulosis; Diverticulitis; Recurrent UTI; Chronic constipation; Nausea; Dementia; Hypertension; Murmur, cardiac; and Gait instability. Today was brought to the hospital after patient had a fall while time to get inside the house. Patient's daughter who was standing at patient's back could not hold the patient as she fell forward. Patient usually uses [place]. Patient denies dizziness, no passing out, no head injury. No other symptoms no nausea vomiting or diarrhea. No abdominal pain. No chest pain or shortness of breath. In the ED patient was found to have abnormal UA and CT of the left hip reveals strong suspicion for left pubic ramus fracture. Patient also found to be tachycardic with hypoxia O2 sats in the range of 88-90% on room air. Requiring 2 L of oxygen via nasal cannula.  Clinical Impression  Pt is received semirecumbent in bed upon entry, awake, alert, and willing to participate. No acute distress noted. Pt is A&O to person only, pleasant and conversational. Family reports several falls in the last 6 months, fairly common with navigation of steps. Pt strength as screened by functional mobility assessment and therapeutic exercise, presents with moderate impairment. Pt falls risk is high as evidenced by history of falls, poor trunk control with lateral/forward reach, and altered posturing during transfers and mobility. Imaging of pt after fall is suspect for L pubic ramus fracture, however at this time, pt only complaining of pain on R hip. Demented state interferes c ability to accurately describe pain, but pt is 0/10 at rest. Patient presenting with impairment of strength, range of motion, balance, and activity tolerance, limiting ability to perform  ADL and mobility tasks at  baseline level of function. Patient will benefit from skilled intervention to address the above impairments and limitations, in order to restore to prior level of function, improve patient safety upon discharge, and to decrease falls risk.      Follow Up Recommendations SNF    Equipment Recommendations  Rolling [place] with 5" wheels    Recommendations for Other Services       Precautions / Restrictions Precautions Precautions: Fall Restrictions Weight Bearing Restrictions: No      Mobility  Bed Mobility Overal bed mobility: Needs Assistance Bed Mobility: Sit to Supine;Supine to Sit     Supine to sit: Max assist (help with scooting hips toward EOB, and with trunk righting. ) Sit to supine: Mod assist (requires assistance with getting legs up onto bed. )      Transfers Overall transfer level: Needs assistance Equipment used: Rolling [place] (2 wheeled) Transfers: Sit to/from Stand Sit to Stand: Min assist         General transfer comment: Unable to effectively stabilize self c RW use, despite ample time allowed.   Ambulation/Gait Ambulation/Gait assistance:  (Pt unable to perform at this time; cannot raise either foot, limited weight shifting, and reluctant to try further due to pain.)              Stairs            Wheelchair Mobility    Modified Rankin (Stroke Patients Only)       Balance Overall balance assessment: Needs assistance Sitting-balance support: No upper extremity supported Sitting balance-Leahy Scale: Fair     Standing balance support: Bilateral upper extremity supported Standing balance-Leahy Scale: Poor  Pertinent Vitals/Pain Pain Assessment: Faces Faces Pain Scale: Hurts even more Pain Location: report mild pain in R posterior hip c movement and weight bearing, despite imaging reports on L sided fracture. Pt is unable to rate pain.  Pain Intervention(s):  Limited activity within patient's tolerance    Home Living Family/patient expects to be discharged to:: Skilled nursing facility Living Arrangements: Alone Available Help at Discharge: Family Type of Home: Mobile home Home Access: Stairs to enter   Entrance Stairs-Number of Steps: 3 Home Layout: One level Home Equipment: [place] - 2 wheels;Cane - single point      Prior Function Level of Independence: Needs assistance   Gait / Transfers Assistance Needed: Able to perform HH mobility with SPC or RW modI, but cannon perform community distance amb. Uses a WC for grocery shopping trips with daughter.   ADL's / Homemaking Assistance Needed: Daughter/ son-in-law check in on her 6-8 times daily to assist c meals, bathing, dressing, etc. Pt able to dishes and light house work still modI.         Hand Dominance        Extremity/Trunk Assessment   Upper Extremity Assessment: Generalized weakness (limited shoulder function, requires AAROM to reach top of head. )           Lower Extremity Assessment: Generalized weakness (Lacks terminal knee extension biilat. )      Cervical / Trunk Assessment: Kyphotic  Communication   Communication: HOH  Cognition Arousal/Alertness: Awake/alert Behavior During Therapy: WFL for tasks assessed/performed Overall Cognitive Status: Within Functional Limits for tasks assessed       Memory: Decreased short-term memory;Decreased recall of precautions (Pt is oriented to person only. )              General Comments      Exercises General Exercises - Lower Extremity Gluteal Sets: AROM;Supine;10 reps;Both;Strengthening (Bridging position. ) Short Arc Quad: AROM;Both;15 reps;Supine;Strengthening Heel Slides: AROM;Strengthening;Both;15 reps;Supine Hip ABduction/ADduction: AAROM;Strengthening;Both;15 reps;Supine      Assessment/Plan    PT Assessment Patient needs continued PT services  PT Diagnosis Difficulty walking;Acute  pain;Generalized weakness   PT Problem List Decreased strength;Decreased activity tolerance;Decreased balance;Decreased mobility;Pain  PT Treatment Interventions Balance training;Gait training;Stair training;Functional mobility training;Therapeutic activities;Therapeutic exercise;Patient/family education   PT Goals (Current goals can be found in the Care Plan section) Acute Rehab PT Goals Patient Stated Goal: Improve strength and ease in mobility.  PT Goal Formulation: With patient/family Time For Goal Achievement: 04/19/15 Potential to Achieve Goals: Fair    Frequency Min 3X/week   Barriers to discharge Inaccessible home environment      Co-evaluation               End of Session Equipment Utilized During Treatment: Gait belt Activity Tolerance: Patient limited by pain Patient left: in bed;with bed alarm set;with family/visitor present Nurse Communication: Other (comment)         Time: 2336-1224 PT Time Calculation (min) (ACUTE ONLY): 28 min   Charges:   PT Evaluation $Initial PT Evaluation Tier I: 1 Procedure PT Treatments $Therapeutic Exercise: 8-22 mins   PT G Codes:        Buccola,Allan C 04-19-2015, 12:22 PM  12:26 PM  Etta Grandchild, PT, DPT [place] License # [id number]

## 2015-04-05 NOTE — Progress Notes (Signed)
ANTIBIOTIC CONSULT NOTE - FOLLOW UP  Pharmacy Consult for Rocehin Indication: UTI  Allergies  Allergen Reactions  . Flagyl [Metronidazole Hcl]   . Other     Most antibiotics causes severe c. difficille.  . Ciprofloxacin Rash   Patient Measurements: Height: 5\' 7"  (170.2 cm) Weight: 105 lb 2.6 oz (47.7 kg) IBW/kg (Calculated) : 61.6  Vital Signs: Temp: 98.2 F (36.8 C) (07/05 0651) Temp Source: Oral (07/05 0651) BP: 125/63 mmHg (07/05 0651) Pulse Rate: 98 (07/05 0651) Intake/Output from previous day: 07/04 0701 - 07/05 0700 In: 484 [P.O.:360; I.V.:74; IV Piggyback:50] Out: 500 [Urine:500] Intake/Output from this shift: Total I/O In: 120 [P.O.:120] Out: -   Labs:  Recent Labs  04/02/15 1602 04/03/15 0612 04/04/15 0757  WBC 15.4* 14.6*  --   HGB 12.4 11.7*  --   PLT 184 200  --   CREATININE 0.58 0.73 0.60   Estimated Creatinine Clearance: 38 mL/min (by C-G formula based on Cr of 0.6). No results for input(s): VANCOTROUGH, VANCOPEAK, VANCORANDOM, GENTTROUGH, GENTPEAK, GENTRANDOM, TOBRATROUGH, TOBRAPEAK, TOBRARND, AMIKACINPEAK, AMIKACINTROU, AMIKACIN in the last 72 hours.   Microbiology: Recent Results (from the past 720 hour(s))  Urine culture     Status: None (Preliminary result)   Collection Time: 04/02/15  7:30 PM  Result Value Ref Range Status   Specimen Description URINE, CLEAN CATCH  Final   Special Requests Normal  Final   Culture   Final    >=100,000 COLONIES/mL GRAM NEGATIVE RODS Performed at [facility]    Report Status PENDING  Incomplete    Anti-infectives    Start     Dose/Rate Route Frequency Ordered Stop   04/03/15 1800  cefTRIAXone (ROCEPHIN) 1 g in dextrose 5 % 50 mL IVPB - Premix  Status:  Discontinued     1 g 100 mL/hr over 30 Minutes Intravenous Every 24 hours 04/02/15 2159 04/03/15 0742   04/03/15 1800  cefTRIAXone (ROCEPHIN) 1 g in dextrose 5 % 50 mL IVPB     1 g 100 mL/hr over 30 Minutes Intravenous Every 24 hours 04/03/15  0743     04/02/15 2300  cefTRIAXone (ROCEPHIN) 1 g in dextrose 5 % 50 mL IVPB - Premix  Status:  Discontinued     1 g 100 mL/hr over 30 Minutes Intravenous Every 24 hours 04/02/15 2156 04/02/15 2159   04/02/15 1930  cefTRIAXone (ROCEPHIN) 1 g in dextrose 5 % 50 mL IVPB     1 g Intravenous  Once 04/02/15 1919 04/02/15 1938      Assessment: Okay for Protocol, Day #4 Therapy for GNR UTI (ID / Sens pending).  Goal of Therapy:  Eradicate infection.   Plan:  Continue Rocephin 1gm IV q24hrs Monitor labs, progress, c/s   Pricilla Larsson 04/05/2015,2:05 PM

## 2015-04-05 NOTE — Care Management (Signed)
Important Message  Patient Details  Name: [name] MRN: [id number] Date of Birth: [birth year]   Medicare Important Message Given:  Yes-second notification given    Sherald Barge, RN 04/05/2015, 2:06 PM

## 2015-04-06 ENCOUNTER — Inpatient Hospital Stay
Admission: RE | Admit: 2015-04-06 | Discharge: 2016-03-30 | Disposition: A | Discharge status: Nursing Home (Includes ICF) | Payer: No Typology Code available for payment source | Source: Ambulatory Visit | Attending: Internal Medicine | Admitting: Internal Medicine

## 2015-04-06 DIAGNOSIS — T148XXA Other injury of unspecified body region, initial encounter: Secondary | ICD-10-CM

## 2015-04-06 DIAGNOSIS — R05 Cough: Secondary | ICD-10-CM

## 2015-04-06 DIAGNOSIS — R059 Cough, unspecified: Secondary | ICD-10-CM

## 2015-04-06 DIAGNOSIS — R1310 Dysphagia, unspecified: Secondary | ICD-10-CM

## 2015-04-06 DIAGNOSIS — R600 Localized edema: Secondary | ICD-10-CM

## 2015-04-06 DIAGNOSIS — M8610 Other acute osteomyelitis, unspecified site: Secondary | ICD-10-CM

## 2015-04-06 DIAGNOSIS — M545 Low back pain, unspecified: Principal | ICD-10-CM

## 2015-04-06 DIAGNOSIS — I509 Heart failure, unspecified: Secondary | ICD-10-CM

## 2015-04-06 DIAGNOSIS — J189 Pneumonia, unspecified organism: Secondary | ICD-10-CM

## 2015-04-06 DIAGNOSIS — R52 Pain, unspecified: Secondary | ICD-10-CM

## 2015-04-06 LAB — URINE CULTURE
Culture: >=100000
SPECIAL REQUESTS: NORMAL

## 2015-04-06 LAB — BASIC METABOLIC PANEL
ANION GAP: 9 (ref 5–15)
BUN: 22 mg/dL — ABNORMAL HIGH (ref 6–20)
CALCIUM: 8.3 mg/dL — AB (ref 8.9–10.3)
CHLORIDE: 98 mmol/L — AB (ref 101–111)
CO2: 27 mmol/L (ref 22–32)
CREATININE: 0.57 mg/dL (ref 0.44–1.00)
GFR calc Af Amer: >60 mL/min (ref >60)
GFR calc non Af Amer: >60 mL/min (ref >60)
GLUCOSE: 124 mg/dL — AB (ref 65–99)
Potassium: 4.4 mmol/L (ref 3.5–5.1)
Sodium: 134 mmol/L — ABNORMAL LOW (ref 135–145)

## 2015-04-06 NOTE — Care Management Note (Signed)
Case Management Note  Patient Details  Name: PEREL HAUSCHILD MRN: [id number] Date of Birth: May 17, 1926  Expected Discharge Date:                  Expected Discharge Plan:  [place]  In-House Referral:  Clinical Social Work  Discharge planning Services  CM Consult  Post Acute Care Choice:  NA Choice offered to:  NA  DME Arranged:    DME Agency:     HH Arranged:    [place] Agency:     Status of Service:  Completed, signed off  Medicare Important Message Given:  Yes-second notification given Date Medicare IM Given:    Medicare IM give by:    Date Additional Medicare IM Given:    Additional Medicare Important Message give by:     If discussed at [place] of Stay Meetings, dates discussed:    Additional Comments: Pt discharged today to SNF. Pt CSW has arranged for placement. Pt and RN aware of DC plan. No CM needs.  Sherald Barge, RN 04/06/2015, 3:13 PM

## 2015-04-06 NOTE — Progress Notes (Signed)
Report called to the [facility]. 

## 2015-04-06 NOTE — Progress Notes (Signed)
Subjective: The patient had a fairly comfortable night. She is being treated for nondisplaced. Pubic ramus fracture has been seen by orthopedist and conservative care was recommended she does have ongoing urinary tract infection gram-negative rods treated with Rocephin  Objective: Vital signs in last 24 hours: Temp:  [98 F (36.7 C)-98.9 F (37.2 C)] 98.9 F (37.2 C) (07/05 2121) Pulse Rate:  [90-103] 90 (07/05 2121) Resp:  [16-20] 20 (07/05 2121) BP: (109-125)/(45-63) 117/45 mmHg (07/05 2121) SpO2:  [91 %-95 %] 95 % (07/05 2121) Weight change:  Last BM Date:  (unknown)  Intake/Output from previous day: 07/05 0701 - 07/06 0700 In: 530 [P.O.:530] Out: 325 [Urine:325] Intake/Output this shift: Total I/O In: 120 [P.O.:120] Out: 325 [Urine:325]  Physical Exam: Gen. appearance-the patient is alert and oriented  HEENT negative  Neck supple no JVD or thyroid abnormalities  Heart regular rhythm no murmurs  Abdomen the palpable organs or masses slight tenderness over lower abdomen  Extremities mild edema  No results for input(s): WBC, HGB, HCT, PLT in the last 72 hours. BMET  Recent Labs  04/04/15 0757  NA 132*  K 4.4  CL 97*  CO2 27  GLUCOSE 129*  BUN 22*  CREATININE 0.60  CALCIUM 8.2*    Studies/Results: US Venous Img Lower Bilateral  04/04/2015   CLINICAL DATA:  Bilateral swelling x2 days, pain, varicose/ spider veins  EXAM: BILATERAL LOWER EXTREMITY VENOUS DOPPLER ULTRASOUND  TECHNIQUE: Gray-scale sonography with compression, as well as color and duplex ultrasound, were performed to evaluate the deep venous system from the level of the common femoral vein through the popliteal and proximal calf veins.  COMPARISON:  10/04/2003  FINDINGS: Normal compressibility of the common femoral, superficial femoral, and popliteal veins, as well as the proximal calf veins. No filling defects to suggest DVT on grayscale or color Doppler imaging. Doppler waveforms show normal  direction of venous flow, normal respiratory phasicity and response to augmentation. Visualized segments of the saphenous venous system normal in caliber and compressibility.  IMPRESSION: 1. No evidence of lower extremity deep vein thrombosis, bilaterally.   Electronically Signed   By: Lucrezia Europe M.D.   On: 04/04/2015 10:19    Medications:  . antiseptic oral rinse  7 mL Mouth Rinse BID  . aspirin  81 mg Oral Daily  . cefTRIAXone (ROCEPHIN)  IV  1 g Intravenous Q24H  . enoxaparin (LOVENOX) injection  40 mg Subcutaneous Q24H  . furosemide  20 mg Oral Daily  . latanoprost  1 drop Both Eyes QHS  . potassium chloride  20 mEq Oral BID    . sodium chloride 10 mL/hr at 04/04/15 1036     Assessment/Plan: 1. Suprapubic ramus fracture following fall-patient will be admitted to skilled facility to start physical therapy weightbearing etc.  2. Urinary tract infection gram-negative organisms-continue IV Rocephin repeat UA  3. Hyponatremia to repeat be met today   LOS: 4 days   Trudi Morgenthaler G 04/06/2015, 6:25 AM

## 2015-04-06 NOTE — Discharge Summary (Signed)
Physician Discharge Summary  [name] VOJ:[id number] DOB: 1926/04/21 DOA: 04/02/2015  PCP: Lanette Hampshire, MD  Admit date: 04/02/2015 Discharge date: 04/06/2015     Discharge Diagnoses:  1. Fracture suprapubic ramus 2. Urinary tract infection gram-negative rods Morganella morganii 3. Hyponatremia 4. History of Clostridium difficile colitis  5. Chronic dementia    Discharge Condition: Stable Disposition: Skilled nursing facility Diet recommendation: 2 g sodium diet Filed Weights   04/02/15 1416 04/02/15 2043  Weight: 47.628 kg (105 lb) 47.7 kg (105 lb 2.6 oz)    History of present illness:  The patient was admitted through ED following a fall at home. X-ray showed evidence of nondisplaced suprapubic ramus fracture. She was noted have evidence of urinary tract infection on admission  Hospital Course:  The patient was placed at bed rest or med surge floor. She was continued on medications listed below. Short after admission she had episode of hypoxia following labs including d-dimer i which was elevated. Subsequent CT angiogram of chest was negative for PE and follow-up venous Doppler ultrasound was negative for evidence of thrombophlebitis. This patient was seen by orthopedist recommended conservative treatment with gradual weightbearing. The patient remains somewhat confused mentally she also treated for urinary tract infection gram-negative rods and IV Rocephin. She also did have hyponatremia which improved with fluids and was 134 at the time of discharge   Discharge Instructions Patient is to continue medications listed below is also to have 40 mg of Lovenox administered subcutaneously daily for 2 weeks. She is to continue medications listed below    Medication List    TAKE these medications        acetaminophen 500 MG tablet  Commonly known as:  TYLENOL  Take 500 mg by mouth every 6 (six) hours as needed. Takes 3 a day     aspirin 81 MG tablet  Take 81 mg by mouth  daily.     FLORA-Q PO  Take by mouth. Three a week     GENTEAL OP  Place 1 drop into both eyes as needed (Dry Eyes).     latanoprost 0.005 % ophthalmic solution  Commonly known as:  XALATAN  Place 1 drop into both eyes at bedtime.     multivitamin with minerals tablet  Take 1 tablet by mouth daily.     OSTEO BI-FLEX REGULAR STRENGTH PO  Take 1 tablet by mouth daily.     Potassium Gluconate 595 MG Caps  Take 1 capsule by mouth daily.     psyllium 0.52 G capsule  Commonly known as:  REGULOID  Take by mouth as needed.       Allergies  Allergen Reactions  . Flagyl [Metronidazole Hcl]   . Other     Most antibiotics causes severe c. difficille.  . Ciprofloxacin Rash    The results of significant diagnostics from this hospitalization (including imaging, microbiology, ancillary and laboratory) are listed below for reference.    Significant Diagnostic Studies: Ct Angio Chest Pe W/cm &/or Wo Cm  04/03/2015   CLINICAL DATA:  Fall yesterday with increased right shoulder pain and shortness of breath.  EXAM: CT ANGIOGRAPHY CHEST WITH CONTRAST  TECHNIQUE: Multidetector CT imaging of the chest was performed using the standard protocol during bolus administration of intravenous contrast. Multiplanar CT image reconstructions and MIPs were obtained to evaluate the vascular anatomy.  CONTRAST:  197mL OMNIPAQUE IOHEXOL 350 MG/ML SOLN  COMPARISON:  None.  FINDINGS: THORACIC INLET/BODY WALL:  Extensive dystrophic calcification in the right breast.  Sub cm bilateral thyroid nodules with mild dystrophic calcification on the right.  MEDIASTINUM:  Chronic cardiomegaly. Chronic enlargement of the main pulmonary artery to 36 mm, compatible pulmonary hypertension. No pericardial effusion. No acute vascular abnormality, including pulmonary embolism or aortic dissection. No adenopathy.  LUNG WINDOWS:  Subpleural sharply-circumscribed opacities in the lingula and medial left lower lobe compatible with  atelectasis. No convincing pneumonia. Subtle diffuse interlobular septal thickening. No concerning pulmonary nodule.  UPPER ABDOMEN:  Partly visible 10 cm cyst from the right kidney.  No acute findings.  OSSEOUS:  No acute fracture.  No suspicious lytic or blastic lesions.  Review of the MIP images confirms the above findings.  IMPRESSION: 1. Negative for pulmonary embolism. 2. Subsegmental lingular and left lower lobe atelectasis. 3. Mild septal thickening, likely developing pulmonary edema.   Electronically Signed   By: Monte Fantasia M.D.   On: 04/03/2015 23:21   Ct Hip Left Wo Contrast  04/02/2015   CLINICAL DATA:  [place] today, walking out the front door reaching for a [place]. Left hip pain with movement.  EXAM: CT OF THE LEFT HIP WITHOUT CONTRAST  TECHNIQUE: Multidetector CT imaging of the left hip was performed according to the standard protocol. Multiplanar CT image reconstructions were also generated.  COMPARISON:  Radiography same day.  FINDINGS: I have reviewed this case with Dr. Rozetta Nunnery of our [facility] division. We believe that there is a nondisplaced fracture of the superior ramus on the left. No inferior ramus fracture is seen. No fracture of the femur or acetabulum. Chronic osteoarthritis of the left hip is noted.  IMPRESSION: Strong suspicion of a nondisplaced fracture of the superior ramus on the left.   Electronically Signed   By: Nelson Chimes M.D.   On: 04/02/2015 17:39   US Venous Img Lower Bilateral  04/04/2015   CLINICAL DATA:  Bilateral swelling x2 days, pain, varicose/ spider veins  EXAM: BILATERAL LOWER EXTREMITY VENOUS DOPPLER ULTRASOUND  TECHNIQUE: Gray-scale sonography with compression, as well as color and duplex ultrasound, were performed to evaluate the deep venous system from the level of the common femoral vein through the popliteal and proximal calf veins.  COMPARISON:  10/04/2003  FINDINGS: Normal compressibility of the common femoral, superficial femoral, and popliteal veins, as  well as the proximal calf veins. No filling defects to suggest DVT on grayscale or color Doppler imaging. Doppler waveforms show normal direction of venous flow, normal respiratory phasicity and response to augmentation. Visualized segments of the saphenous venous system normal in caliber and compressibility.  IMPRESSION: 1. No evidence of lower extremity deep vein thrombosis, bilaterally.   Electronically Signed   By: Lucrezia Europe M.D.   On: 04/04/2015 10:19   Dg Chest Portable 1 View  04/02/2015   CLINICAL DATA:  Tachycardia. Fall with left hip pain. Hypertension. Demented.  EXAM: PORTABLE CHEST - 1 VIEW  COMPARISON:  06/11/2014  FINDINGS: Numerous leads and wires project over the chest. High-riding right worse than left humeral heads, consistent with chronic rotator cuff insufficiency. Midline trachea. Moderate cardiomegaly with transverse aortic atherosclerosis. No right and no definite left pleural effusion. No pneumothorax. Artifacts project over the lung apices bilaterally. No congestive failure. Vague increased density projecting over the right lung base laterally.  IMPRESSION: No acute or posttraumatic deformity identified.  Cardiomegaly and aortic atherosclerosis.  Favor artifactual density projecting over the right lung base. If there are respiratory symptoms, repeat radiograph could be performed to exclude pneumonia or aspiration.   Electronically Signed  By: Abigail Miyamoto M.D.   On: 04/02/2015 19:50   Dg Finger Little Right  04/03/2015   CLINICAL DATA:  Right fall yesterday, bruising to right level finger.  EXAM: RIGHT LITTLE FINGER 2+V  COMPARISON:  None.  FINDINGS: Advanced degenerative joint disease in the DIP joint of the right little finger. Moderate degenerative changes in the remaining visualized IP joints. No fracture, subluxation or dislocation. Soft tissues are intact.  IMPRESSION: Degenerative changes in the IP joints, most pronounced in the right fifth DIP joint. No visible acute bony  abnormality.   Electronically Signed   By: Rolm Baptise M.D.   On: 04/03/2015 08:45   Dg Hip Unilat With Pelvis 2-3 Views Left  04/02/2015   CLINICAL DATA:  Fall. Left hip pain according to the patient. Witnessed fall walking out the front door. Fell reaching for a [place]. Injury a few hr ago. Personal history of dementia.  EXAM: LEFT HIP (WITH PELVIS) 2-3 VIEWS  COMPARISON:  None.  FINDINGS: The bones are osteopenic. There has been bipolar hemiarthroplasty on the right. No complications seen relative to that. On the left, there is degenerative arthritis with joint space narrowing and circumferential osteophytes. No hip fracture seen on the left. The bones are quite osteopenic, but I suspect there is at least a superior ramus fracture on the left.  IMPRESSION: Suspicion of superior ramus fracture on the left. The bones are quite osteopenic and difficult to evaluate well.  Previous bipolar hemiarthroplasty on the right without apparent complication.  Osteoarthritis of the left hip.   Electronically Signed   By: Nelson Chimes M.D.   On: 04/02/2015 16:06    Microbiology: Recent Results (from the past 240 hour(s))  Urine culture     Status: None   Collection Time: 04/02/15  7:30 PM  Result Value Ref Range Status   Specimen Description URINE, CLEAN CATCH  Final   Special Requests Normal  Final   Culture   Final    >=100,000 COLONIES/mL MORGANELLA MORGANII Performed at [facility]    Report Status 04/06/2015 FINAL  Final   Organism ID, Bacteria MORGANELLA MORGANII  Final      Susceptibility   Morganella morganii - MIC*    AMPICILLIN 16 RESISTANT Resistant     CEFAZOLIN >=64 RESISTANT Resistant     CEFTRIAXONE <=1 SENSITIVE Sensitive     CIPROFLOXACIN <=0.25 SENSITIVE Sensitive     GENTAMICIN <=1 SENSITIVE Sensitive     IMIPENEM 1 SENSITIVE Sensitive     NITROFURANTOIN 64 RESISTANT Resistant     TRIMETH/SULFA <=20 SENSITIVE Sensitive     AMPICILLIN/SULBACTAM 4 SENSITIVE Sensitive      PIP/TAZO <=4 SENSITIVE Sensitive     * >=100,000 COLONIES/mL MORGANELLA MORGANII     Labs: Basic Metabolic Panel:  Recent Labs Lab 04/02/15 1602 04/03/15 0612 04/04/15 0757 04/06/15 0703  NA 131* 130* 132* 134*  K 3.9 4.1 4.4 4.4  CL 93* 93* 97* 98*  CO2 28 27 27 27   GLUCOSE 98 146* 129* 124*  BUN 15 21* 22* 22*  CREATININE 0.58 0.73 0.60 0.57  CALCIUM 9.0 8.6* 8.2* 8.3*   Liver Function Tests:  Recent Labs Lab 04/03/15 0612  AST 25  ALT 16  ALKPHOS 60  BILITOT 0.8  PROT 6.3*  ALBUMIN 4.0   No results for input(s): LIPASE, AMYLASE in the last 168 hours. No results for input(s): AMMONIA in the last 168 hours. CBC:  Recent Labs Lab 04/02/15 1602 04/03/15 0612  WBC  15.4* 14.6*  NEUTROABS 14.2*  --   HGB 12.4 11.7*  HCT 37.1 35.6*  MCV 90.7 92.0  PLT 184 200   Cardiac Enzymes: No results for input(s): CKTOTAL, CKMB, CKMBINDEX, TROPONINI in the last 168 hours. BNP: BNP (last 3 results)  Recent Labs  04/02/15 1557  BNP 106.0*    ProBNP (last 3 results) No results for input(s): PROBNP in the last 8760 hours.  CBG: No results for input(s): GLUCAP in the last 168 hours.  Active Problems:   UTI (lower urinary tract infection)   Pelvic fracture   Pressure ulcer   Time coordinating discharge: 45 minutes Signed:  Marjean Donna, MD 04/06/2015, 10:53 AM

## 2015-04-06 NOTE — Clinical Social Work Note (Signed)
CSW facilitated discharge.  CSW left a voicemail for Tami at [facility] advising that patient was being discharged.  CSW also faxed clinicals via [facility].  CSW spoke with patient's daughter, Mrs. Tamala Julian, and advised that patient was being discharged today. Mrs. Tamala Julian advised that she would try to get off work to bring patient clothes and to sign papwerwork.   CSW signing off.  Kody Brandl D, LCSW. [phone]

## 2015-04-14 ENCOUNTER — Ambulatory Visit (INDEPENDENT_AMBULATORY_CARE_PROVIDER_SITE_OTHER): Payer: Medicare Other | Admitting: Orthopedic Surgery

## 2015-04-14 VITALS — BP 116/76 | Ht 67.0 in | Wt 105.2 lb

## 2015-04-14 DIAGNOSIS — M545 Low back pain: Secondary | ICD-10-CM

## 2015-04-14 NOTE — Patient Instructions (Signed)
X rays order of L SPINE to be done at [facility] [place]

## 2015-04-15 ENCOUNTER — Inpatient Hospital Stay (HOSPITAL_COMMUNITY)
Admit: 2015-04-15 | Discharge: 2015-04-15 | Disposition: A | Discharge status: Home / Self Care | Payer: Medicare Other | Attending: Orthopedic Surgery | Admitting: Orthopedic Surgery

## 2015-04-15 ENCOUNTER — Encounter: Payer: Self-pay | Admitting: Orthopedic Surgery

## 2015-04-15 DIAGNOSIS — M545 Low back pain: Secondary | ICD-10-CM

## 2015-04-15 NOTE — Progress Notes (Signed)
Patient ID: [name], female   DOB: [birth year], 82 y.o.   MRN: [id number]  Patient ID: [name], female   DOB: 18-Jun-1926, 81 y.o.   MRN: [id number] Established new problem    Chief Complaint  Patient presents with  . Follow-up    hospital follow up left pelvic fracture, DOI 04/02/15     [name] is a 81 y.o. female.   HPI  The patient back on July 2 she was admitted to the hospital as noted below she was thought to have a questionable superior pubic ramus fracture. Her daughter tells me she is at the [facility] but she has not walked, she stood but had very little steps. She actually complains of back pain and weakness in both legs. She denies numbness or tingling. She has had no change in her bowel or bladder bladder habits  The discharge summary as noted below PCP: Lanette Hampshire, MD  Admit date: 04/02/2015 Discharge date: 04/06/2015  Discharge Diagnoses:  1. Fracture suprapubic ramus 2. Urinary tract infection gram-negative rods Morganella morganii 3. Hyponatremia 4. History of Clostridium difficile colitis  5. Chronic dementia  Discharge Condition: Stable Disposition: Skilled nursing facility Diet recommendation: 2 g sodium diet Filed Weights     04/02/15 1416  04/02/15 2043   Weight:  47.628 kg (105 lb)  47.7 kg (105 lb 2.6 oz)     History of present illness:   The patient was admitted through ED following a fall at home. X-ray showed evidence of nondisplaced suprapubic ramus fracture. She was noted have evidence of urinary tract infection on admission  Hospital Course:  The patient was placed at bed rest or med surge floor. She was continued on medications listed below. Short after admission she had episode of hypoxia following labs including d-dimer i which was elevated. Subsequent CT angiogram of chest was negative for PE and follow-up venous Doppler ultrasound was negative for evidence of thrombophlebitis. This patient was seen by  orthopedist recommended conservative treatment with gradual weightbearing. The patient remains somewhat confused mentally she also treated for urinary tract infection gram-negative rods and IV Rocephin. She also did have hyponatremia which improved with fluids and was 134 at the time of discharge   Review of Systems See hpi  Past Medical History  Diagnosis Date  . Clostridium difficile colitis     dating back to 2004  . Glaucoma   . Diverticulosis   . Diverticulitis   . Recurrent UTI   . Chronic constipation   . Nausea   . Dementia   . Hypertension   . Murmur, cardiac   . Gait instability     Uses [place] or cane toambulate    Past Surgical History  Procedure Laterality Date  . Orif right hip  2004    Dr.Nomi Rudnicki  . Bilateral eyelid surgery    . Colonoscopy  10/30/04    NUR  . Vulvectomy N/A 06/14/2014    Procedure: PARTIAL MODIFED ANTERIOR RADICAL VULVECTOMY ;  Surgeon: Everitt Amber, MD;  Location: [facility];  Service: Gynecology;  Laterality: N/A;    Family History  Problem Relation Age of Onset  . Diabetes type II      Social History History  Substance Use Topics  . Smoking status: Never Smoker   . Smokeless tobacco: Never Used  . Alcohol Use: No    Allergies  Allergen Reactions  . Flagyl [Metronidazole Hcl]   . Other     Most antibiotics causes severe  c. difficille.  . Ciprofloxacin Rash    No current outpatient prescriptions on file.   No current facility-administered medications for this visit.       Physical Exam Blood pressure 116/76, height 5\' 7"  (1.702 m), weight 105 lb 2.6 oz (47.7 kg). Physical Exam The patient is well developed well nourished and well groomed. Orientation to person place and time is normal  Mood is pleasant. Ambulatory status wheelchair Findings as follows. A proximal hip flexor weakness was noted bilaterally. Knee extension normal dorsiflexion plantar flexion of the feet normal  Back pain and tenderness in the lower lumbar  region. Sensation in both legs was normal to palpation to soft touch pressure and position  Pulses were intact capillary refill is normal  Data Reviewed I reviewed the CT scan and the x-rays and I did not see definitive fracture in her superior pubic ramus although there was an abnormality there. Based on her clinical exam it would seem that this subtle of an injury would not cause her this type of problem in terms of gait however, I think she does have enough back pain tenderness and proximal hip flexor weakness to warrant further imaging which we will arrange  Assessment Encounter Diagnosis  Name Primary?  . Low back pain, unspecified back pain laterality, with sciatica presence unspecified Yes    Plan Lumbar spine films, continue physical therapy to maintain some semblance of motion and strength

## 2015-04-20 ENCOUNTER — Inpatient Hospital Stay (HOSPITAL_COMMUNITY): Payer: Medicare Other | Attending: Family Medicine

## 2015-04-20 ENCOUNTER — Other Ambulatory Visit: Payer: Self-pay | Admitting: *Deleted

## 2015-04-20 DIAGNOSIS — M48061 Spinal stenosis, lumbar region without neurogenic claudication: Secondary | ICD-10-CM

## 2015-04-28 ENCOUNTER — Encounter (HOSPITAL_COMMUNITY)
Admission: AD | Admit: 2015-04-28 | Discharge: 2015-04-28 | Disposition: A | Discharge status: Home / Self Care | Payer: Medicare Other | Source: Skilled Nursing Facility | Attending: Family Medicine | Admitting: Family Medicine

## 2015-04-28 ENCOUNTER — Inpatient Hospital Stay (HOSPITAL_COMMUNITY)
Admit: 2015-04-28 | Discharge: 2015-04-28 | Disposition: A | Discharge status: Home / Self Care | Payer: Medicare Other | Attending: Family Medicine | Admitting: Family Medicine

## 2015-04-28 ENCOUNTER — Ambulatory Visit (HOSPITAL_COMMUNITY): Payer: Medicare Other | Attending: Family Medicine | Admitting: Speech Pathology

## 2015-04-28 DIAGNOSIS — R1314 Dysphagia, pharyngoesophageal phase: Secondary | ICD-10-CM

## 2015-04-28 LAB — COMPREHENSIVE METABOLIC PANEL
ALBUMIN: 3.2 g/dL — AB (ref 3.5–5.0)
ALK PHOS: 179 U/L — AB (ref 38–126)
ALT: 12 U/L — ABNORMAL LOW (ref 14–54)
ANION GAP: 6 (ref 5–15)
AST: 15 U/L (ref 15–41)
BILIRUBIN TOTAL: 0.5 mg/dL (ref 0.3–1.2)
BUN: 24 mg/dL — ABNORMAL HIGH (ref 6–20)
CALCIUM: 8.6 mg/dL — AB (ref 8.9–10.3)
CO2: 31 mmol/L (ref 22–32)
Chloride: 102 mmol/L (ref 101–111)
Creatinine, Ser: 0.52 mg/dL (ref 0.44–1.00)
GFR calc Af Amer: >60 mL/min (ref >60)
GLUCOSE: 100 mg/dL — AB (ref 65–99)
POTASSIUM: 4.1 mmol/L (ref 3.5–5.1)
SODIUM: 139 mmol/L (ref 135–145)
Total Protein: 5.8 g/dL — ABNORMAL LOW (ref 6.5–8.1)

## 2015-04-28 LAB — CBC WITH DIFFERENTIAL/PLATELET
Basophils Absolute: 0 10*3/uL (ref 0.0–0.1)
Basophils Relative: 1 % (ref 0–1)
Eosinophils Absolute: 0.1 10*3/uL (ref 0.0–0.7)
Eosinophils Relative: 3 % (ref 0–5)
HCT: 37 % (ref 36.0–46.0)
HEMOGLOBIN: 11.3 g/dL — AB (ref 12.0–15.0)
LYMPHS ABS: 0.6 10*3/uL — AB (ref 0.7–4.0)
LYMPHS PCT: 12 % (ref 12–46)
MCH: 29.9 pg (ref 26.0–34.0)
MCHC: 30.5 g/dL (ref 30.0–36.0)
MCV: 97.9 fL (ref 78.0–100.0)
MONO ABS: 0.4 10*3/uL (ref 0.1–1.0)
MONOS PCT: 8 % (ref 3–12)
NEUTROS ABS: 4.1 10*3/uL (ref 1.7–7.7)
NEUTROS PCT: 76 % (ref 43–77)
PLATELETS: 285 10*3/uL (ref 150–400)
RBC: 3.78 MIL/uL — ABNORMAL LOW (ref 3.87–5.11)
RDW: 16.8 % — ABNORMAL HIGH (ref 11.5–15.5)
WBC: 5.3 10*3/uL (ref 4.0–10.5)

## 2015-04-28 NOTE — Therapy (Signed)
[facility] [place], [place], [address] Phone: [phone]   Fax:  [phone]  Modified Barium Swallow  Patient Details  Name: [name] MRN: [id number] Date of Birth: 12-15-1925 Referring Provider:  Marjean Donna, MD  Encounter Date: 04/28/2015      End of Session - 04/28/15 1639    Visit Number 1   Number of Visits 1   Authorization Type Medicare   SLP Start Time 1304   SLP Stop Time  1336   SLP Time Calculation (min) 32 min   Activity Tolerance Patient tolerated treatment well      Past Medical History  Diagnosis Date  . Clostridium difficile colitis     dating back to 2004  . Glaucoma   . Diverticulosis   . Diverticulitis   . Recurrent UTI   . Chronic constipation   . Nausea   . Dementia   . Hypertension   . Murmur, cardiac   . Gait instability     Uses [place] or cane toambulate    Past Surgical History  Procedure Laterality Date  . Orif right hip  2004    Dr.Harrison  . Bilateral eyelid surgery    . Colonoscopy  10/30/04    NUR  . Vulvectomy N/A 06/14/2014    Procedure: PARTIAL MODIFED ANTERIOR RADICAL VULVECTOMY ;  Surgeon: Everitt Amber, MD;  Location: [facility];  Service: Gynecology;  Laterality: N/A;    There were no vitals filed for this visit.  Visit Diagnosis: Dysphagia, pharyngoesophageal phase      Subjective Assessment - 04/28/15 1633    Subjective "I have this phlegm"   Special Tests MBSS   Currently in Pain? No/denies             General - 04/28/15 1633    General Information   Date of Onset 04/26/15   Other Pertinent Information [name] is an 81 yo woman who was admitted to [facility] for rehab following discharge from [facility] after a fall. She complains that "food won't go down" and feels like she has "phlegm" that won't clear. MBSS requested due to dysphagia and recent weight loss.   Type of Study Other (Comment)  MBSS   Reason for Referral Objectively evaluate  swallowing function   Diet Prior to this Study Dysphagia 3 (soft);Thin liquids  puree meat   Temperature Spikes Noted No   Respiratory Status Room air   History of Recent Intubation No   Behavior/Cognition Alert;Cooperative;Pleasant mood  anxious   Oral Motor / Sensory Function Within functional limits   Self-Feeding Abilities Able to feed self   Patient Positioning Upright in chair/Tumbleform   Baseline Vocal Quality Normal   Volitional Cough Weak   Volitional Swallow Able to elicit   Anatomy Within functional limits   Pharyngeal Secretions Not observed secondary MBS            Oral Preparation/Oral Phase - 04/28/15 1634    Oral Preparation/Oral Phase   Oral Phase Impaired   Oral - Thin   Oral - Thin Cup Within functional limits   Oral - Thin Straw Within functional limits   Oral - Solids   Oral - Puree Weak ligual manipulation;Lingual pumping;Delayed A-P transit;Oral residue;Piecemeal swallowing   Oral - Regular Weak ligual manipulation;Lingual pumping;Oral residue;Piecemeal swallowing;Delayed A-P transit   Electrical stimulation - Oral Phase   Was Electrical Stimulation Used No          Pharyngeal Phase - 04/28/15 1635  Pharyngeal Phase   Pharyngeal Phase Impaired   Pharyngeal - Thin   Pharyngeal- Thin Cup Swallow initiation at vallecula;Delayed swallow initiation;Reduced tongue base retraction;Penetration/Aspiration during swallow;Pharyngeal residue - valleculae;Pharyngeal residue - pyriform;Lateral channel residue   Pharyngeal- Thin Straw Swallow initiation at pyriform sinus;Delayed swallow initiation;Reduced tongue base retraction;Penetration/Aspiration during swallow;Pharyngeal residue - valleculae;Pharyngeal residue - pyriform;Lateral channel residue   Pharyngeal - Solids   Pharyngeal- Puree Swallow initiation at vallecula;Reduced tongue base retraction;Pharyngeal residue - valleculae;Pharyngeal residue - pyriform   Pharyngeal- Mechanical Soft Swallow  initiation at vallecula;Reduced tongue base retraction;Pharyngeal residue - valleculae;Pharyngeal residue - pyriform   Electrical Stimulation - Pharyngeal Phase   Was Electrical Stimulation Used No          Cricopharyngeal Phase - 05-26-2015 1638    Cervical Esophageal Phase   Cervical Esophageal Phase Within functional limits           Plan - 05/26/15 1639    Clinical Impression Statement Pt presents with moderate oral phase dysphagia and mild/mod pharyngeal phase dysphagia characterized by reduced lingual coordination resulting in decreased bolus manipulation and cohesiveness, decreased A-P transit, and piecemeal deglutition. Swallow trigger is at the level of the valleculae with puree, decreased tongue base retraction resulting in mild/mod residuals along tongue blade, valleculae, and lateral channels. Pt benefits from repeat swallows to clear residuals, however mild residuals persist. Pt with premature spillage with thin liquids and swallow is triggered after spilling to pyriforms. Pt with one episode flash penetration likely to the cords, but no tracheal aspiration observed. Pt swallowed barium tablet whole with cup sips thin and pill was transiently delayed at the GE junction. The pill eventually passed after several sips liquid and bite puree. Pt verbalized globus sensation and pointed to pharynx when pill was delayed, no penetration/aspiration observed, however min pharyngeal residue persisted as was apparent throughout the study. Recommend D3/mech soft with puree meat and thin liquids. Encourage pt to "swallow hard" and swallow 2-3x for each bite/sip. Clear throat periodically.          G-Codes - 05-26-15 1640    Functional Assessment Tool Used clinical judgement   Functional Limitations Swallowing   Swallow Current Status (Q5956) At least 20 percent but less than 40 percent impaired, limited or restricted   Swallow Goal Status (L8756) At least 20 percent but less than 40 percent  impaired, limited or restricted   Swallow Discharge Status [phone]) At least 20 percent but less than 40 percent impaired, limited or restricted          Recommendations/Treatment - 2015/05/26 1638    Swallow Evaluation Recommendations   SLP Diet Recommendations Dysphagia 3 (Mech soft);Thin   Liquid Administration via Cup;Straw   Medication Administration Whole meds with liquid   Supervision Patient able to self feed   Compensations Multiple dry swallows after each bite/sip;Effortful swallow;Clear throat intermittently   Postural Changes Remain semi-upright after after feeds/meals (Comment);Seated upright at 90 degrees          Prognosis - 26-May-2015 1638    Prognosis   Prognosis for Safe Diet Advancement Fair   Individuals Consulted   Consulted and Agree with Results and Recommendations Patient   Report Sent to  Facility (Comment)      Problem List Patient Active Problem List   Diagnosis Date Noted  . Pressure ulcer 04/03/2015  . UTI (lower urinary tract infection) 04/02/2015  . Pelvic fracture 04/02/2015  . Severe vulvar dysplasia 06/14/2014  . VIN III (vulvar intraepithelial neoplasia III) 05/03/2014  . Unspecified  constipation 07/14/2013  . Loss of weight 07/14/2013  . Weight loss 02/16/2013  . Arthritis of shoulder region, right 12/24/2012  . Rotator cuff syndrome of right shoulder 12/24/2012  . H/O diverticulitis of colon 02/19/2012  . IBS (irritable bowel syndrome) 02/19/2012  . Palpitations 02/23/2011  . Abnormal electrocardiogram 02/23/2011  . Elevated blood-pressure reading without diagnosis of hypertension 02/23/2011   Thank you,  Genene Churn, [place]   [facility] 04/28/2015, 4:41 PM  [facility] [address] [place], [place], [address] Phone: [phone]   Fax:  [phone]

## 2015-05-04 ENCOUNTER — Encounter (HOSPITAL_COMMUNITY)
Admission: AD | Admit: 2015-05-04 | Discharge: 2015-05-04 | Disposition: A | Discharge status: Home / Self Care | Payer: Medicare Other | Source: Skilled Nursing Facility | Attending: Family Medicine | Admitting: Family Medicine

## 2015-05-04 LAB — CBC WITH DIFFERENTIAL/PLATELET
BASOS PCT: 0 % (ref 0–1)
Basophils Absolute: 0 10*3/uL (ref 0.0–0.1)
EOS ABS: 0.1 10*3/uL (ref 0.0–0.7)
EOS PCT: 1 % (ref 0–5)
HCT: 37.7 % (ref 36.0–46.0)
Hemoglobin: 11.7 g/dL — ABNORMAL LOW (ref 12.0–15.0)
Lymphocytes Relative: 10 % — ABNORMAL LOW (ref 12–46)
Lymphs Abs: 0.8 10*3/uL (ref 0.7–4.0)
MCH: 29.9 pg (ref 26.0–34.0)
MCHC: 31 g/dL (ref 30.0–36.0)
MCV: 96.4 fL (ref 78.0–100.0)
MONO ABS: 0.5 10*3/uL (ref 0.1–1.0)
Monocytes Relative: 6 % (ref 3–12)
Neutro Abs: 6.5 10*3/uL (ref 1.7–7.7)
Neutrophils Relative %: 83 % — ABNORMAL HIGH (ref 43–77)
PLATELETS: 283 10*3/uL (ref 150–400)
RBC: 3.91 MIL/uL (ref 3.87–5.11)
RDW: 16.2 % — AB (ref 11.5–15.5)
WBC: 7.8 10*3/uL (ref 4.0–10.5)

## 2015-05-04 LAB — BASIC METABOLIC PANEL
Anion gap: 8 (ref 5–15)
BUN: 20 mg/dL (ref 6–20)
CO2: 27 mmol/L (ref 22–32)
Calcium: 8.8 mg/dL — ABNORMAL LOW (ref 8.9–10.3)
Chloride: 103 mmol/L (ref 101–111)
Creatinine, Ser: 0.48 mg/dL (ref 0.44–1.00)
Glucose, Bld: 102 mg/dL — ABNORMAL HIGH (ref 65–99)
Potassium: 4.1 mmol/L (ref 3.5–5.1)
Sodium: 138 mmol/L (ref 135–145)

## 2015-05-05 ENCOUNTER — Telehealth: Payer: Self-pay | Admitting: Orthopedic Surgery

## 2015-05-05 ENCOUNTER — Ambulatory Visit (HOSPITAL_COMMUNITY)
Admit: 2015-05-05 | Discharge: 2015-05-05 | Disposition: A | Discharge status: Home / Self Care | Payer: Medicare Other | Source: Ambulatory Visit | Attending: Orthopedic Surgery | Admitting: Orthopedic Surgery

## 2015-05-05 DIAGNOSIS — M8448XA Pathological fracture, other site, initial encounter for fracture: Secondary | ICD-10-CM | POA: Diagnosis not present

## 2015-05-05 DIAGNOSIS — M545 Low back pain: Secondary | ICD-10-CM | POA: Diagnosis present

## 2015-05-05 DIAGNOSIS — G8929 Other chronic pain: Secondary | ICD-10-CM | POA: Diagnosis not present

## 2015-05-05 DIAGNOSIS — M47896 Other spondylosis, lumbar region: Secondary | ICD-10-CM | POA: Insufficient documentation

## 2015-05-05 DIAGNOSIS — M4806 Spinal stenosis, lumbar region: Secondary | ICD-10-CM | POA: Insufficient documentation

## 2015-05-05 DIAGNOSIS — M48061 Spinal stenosis, lumbar region without neurogenic claudication: Secondary | ICD-10-CM

## 2015-05-05 DIAGNOSIS — M4186 Other forms of scoliosis, lumbar region: Secondary | ICD-10-CM | POA: Diagnosis not present

## 2015-05-05 NOTE — Telephone Encounter (Signed)
Call received from [place] at [facility] with results of Xray and MRI; copy also received in fax if needed.  Please return call to [phone]

## 2015-06-20 ENCOUNTER — Non-Acute Institutional Stay (SKILLED_NURSING_FACILITY): Payer: Medicare Other | Admitting: Internal Medicine

## 2015-06-20 DIAGNOSIS — L89150 Pressure ulcer of sacral region, unstageable: Secondary | ICD-10-CM

## 2015-06-25 NOTE — Progress Notes (Signed)
Patient ID: [name], female   DOB: 1926/03/10, 81 y.o.   MRN: [id number]                PROGRESS NOTE  DATE:  06/20/2015         FACILITY: [facility]            LEVEL OF CARE:   SNF   Acute Visit             CHIEF COMPLAINT:  Wound review.      HISTORY OF PRESENT ILLNESS:  I was asked to see this patient, who has been here since 04/06/2015.  She came, I believe, after suffering a fall and she suffered a non-displaced suprapubic ramus fracture.     She came here with a small pressure area over her right heel.  It resolved, but has since reopened recently.  Apparently over the last two weeks, has developed a second pressure area over the right iliac crest just proximal to her coccyx.    She apparently does not eat well.  Her albumin was 3.2 on 04/28/2015.    PHYSICAL EXAMINATION:   GENERAL APPEARANCE:  The patient is not in any distress.  Her bed has an air mattress on it.            SKIN:   INSPECTION:  There are two areas of concern here.   Firstly, over the Achilles insertion area of her right heel is a tiny open area that is covered with surface slough.  The second area is in a more difficult position just to the right of her coccyx area.  She appears to have some form of bony outgrowth here.  There is some erythema around this wound, although I do not think this is an infection.  It looks like some form of contact dermatitis.    ASSESSMENT/PLAN:            Unstageable wounds, as described.   These are going to need debridement enzymatically with Santyl, probably a Sharp debridement at some point.       Protein calorie malnutrition.   The patient already had an albumin on 04/28/2015 of 3.2.  If this continues, it will be very difficult to heal any form of pressure ulceration.

## 2015-07-12 ENCOUNTER — Ambulatory Visit (HOSPITAL_COMMUNITY)
Admit: 2015-07-12 | Discharge: 2015-07-12 | Disposition: A | Discharge status: Home / Self Care | Payer: Medicare Other | Source: Ambulatory Visit | Attending: Internal Medicine | Admitting: Internal Medicine

## 2015-07-12 ENCOUNTER — Encounter (HOSPITAL_COMMUNITY)
Admission: RE | Admit: 2015-07-12 | Discharge: 2015-07-12 | Disposition: A | Discharge status: Home / Self Care | Payer: Medicare Other | Source: Skilled Nursing Facility | Attending: Family Medicine | Admitting: Family Medicine

## 2015-07-12 DIAGNOSIS — D500 Iron deficiency anemia secondary to blood loss (chronic): Secondary | ICD-10-CM | POA: Insufficient documentation

## 2015-07-12 DIAGNOSIS — L89150 Pressure ulcer of sacral region, unstageable: Secondary | ICD-10-CM | POA: Insufficient documentation

## 2015-07-12 DIAGNOSIS — M533 Sacrococcygeal disorders, not elsewhere classified: Secondary | ICD-10-CM | POA: Diagnosis present

## 2015-07-12 DIAGNOSIS — W19XXXA Unspecified fall, initial encounter: Secondary | ICD-10-CM | POA: Diagnosis not present

## 2015-07-12 DIAGNOSIS — Z79899 Other long term (current) drug therapy: Secondary | ICD-10-CM | POA: Diagnosis present

## 2015-07-12 DIAGNOSIS — M8588 Other specified disorders of bone density and structure, other site: Secondary | ICD-10-CM | POA: Insufficient documentation

## 2015-07-12 DIAGNOSIS — I10 Essential (primary) hypertension: Secondary | ICD-10-CM | POA: Insufficient documentation

## 2015-07-15 LAB — WOUND CULTURE: Gram Stain: NONE SEEN

## 2015-07-16 ENCOUNTER — Encounter (HOSPITAL_COMMUNITY)
Admission: RE | Admit: 2015-07-16 | Discharge: 2015-07-16 | Disposition: A | Discharge status: Home / Self Care | Payer: Medicare Other | Source: Skilled Nursing Facility | Attending: Family Medicine | Admitting: Family Medicine

## 2015-07-16 DIAGNOSIS — L89150 Pressure ulcer of sacral region, unstageable: Secondary | ICD-10-CM | POA: Diagnosis not present

## 2015-07-16 LAB — COMPREHENSIVE METABOLIC PANEL
ALBUMIN: 3.6 g/dL (ref 3.5–5.0)
ALT: 10 U/L — ABNORMAL LOW (ref 14–54)
AST: 18 U/L (ref 15–41)
Alkaline Phosphatase: 75 U/L (ref 38–126)
Anion gap: 4 — ABNORMAL LOW (ref 5–15)
BUN: 24 mg/dL — AB (ref 6–20)
CHLORIDE: 99 mmol/L — AB (ref 101–111)
CO2: 31 mmol/L (ref 22–32)
Calcium: 9.3 mg/dL (ref 8.9–10.3)
Creatinine, Ser: 0.45 mg/dL (ref 0.44–1.00)
GFR calc Af Amer: >60 mL/min (ref >60)
Glucose, Bld: 133 mg/dL — ABNORMAL HIGH (ref 65–99)
POTASSIUM: 4.8 mmol/L (ref 3.5–5.1)
Sodium: 134 mmol/L — ABNORMAL LOW (ref 135–145)
Total Bilirubin: 0.3 mg/dL (ref 0.3–1.2)
Total Protein: 6.3 g/dL — ABNORMAL LOW (ref 6.5–8.1)

## 2015-07-16 LAB — CBC WITH DIFFERENTIAL/PLATELET
BASOS PCT: 0 %
Basophils Absolute: 0 10*3/uL (ref 0.0–0.1)
EOS PCT: 2 %
Eosinophils Absolute: 0.1 10*3/uL (ref 0.0–0.7)
HCT: 37.9 % (ref 36.0–46.0)
Hemoglobin: 11.8 g/dL — ABNORMAL LOW (ref 12.0–15.0)
Lymphocytes Relative: 12 %
Lymphs Abs: 0.6 10*3/uL — ABNORMAL LOW (ref 0.7–4.0)
MCH: 27.3 pg (ref 26.0–34.0)
MCHC: 31.1 g/dL (ref 30.0–36.0)
MCV: 87.5 fL (ref 78.0–100.0)
MONO ABS: 0.4 10*3/uL (ref 0.1–1.0)
Monocytes Relative: 8 %
Neutro Abs: 3.9 10*3/uL (ref 1.7–7.7)
Neutrophils Relative %: 78 %
PLATELETS: 284 10*3/uL (ref 150–400)
RBC: 4.33 MIL/uL (ref 3.87–5.11)
RDW: 15.3 % (ref 11.5–15.5)
WBC: 5 10*3/uL (ref 4.0–10.5)

## 2015-07-18 LAB — WOUND CULTURE

## 2015-07-25 ENCOUNTER — Non-Acute Institutional Stay (SKILLED_NURSING_FACILITY): Payer: Medicare Other | Admitting: Internal Medicine

## 2015-07-25 DIAGNOSIS — L89150 Pressure ulcer of sacral region, unstageable: Secondary | ICD-10-CM | POA: Diagnosis not present

## 2015-07-26 DIAGNOSIS — L89150 Pressure ulcer of sacral region, unstageable: Secondary | ICD-10-CM | POA: Diagnosis not present

## 2015-07-26 LAB — SEDIMENTATION RATE: Sed Rate: 30 mm/hr — ABNORMAL HIGH (ref 0–22)

## 2015-07-26 LAB — C-REACTIVE PROTEIN: CRP: 6.6 mg/dL — AB (ref <1.0)

## 2015-07-30 NOTE — Progress Notes (Signed)
Patient ID: [name], female   DOB: 1926/06/25, 81 y.o.   MRN: [id number]                PROGRESS NOTE  DATE:  07/25/2015       FACILITY: [facility]                 LEVEL OF CARE:   SNF   Acute Visit                CHIEF COMPLAINT:  Follow up wound in her right pelvic area.    HISTORY OF PRESENT ILLNESS:  I saw this lady about a month ago.  At that point, she had an area on her right Achilles that has resolved.    However, the area that was on her pelvis just to the right of her lower sacrum is still open.  This has some form of bony reaction here that apparently developed at some point just before the pressure area.  She has had plain x-rays here, which were negative.  Apparently, this still has quite a bit of drainage.  The facility has been using silver alginate.  Two separate cultures of this wound have been done, which were apparently negative.    Past Medical History  Diagnosis Date  . Clostridium difficile colitis     dating back to 2004  . Glaucoma   . Diverticulosis   . Diverticulitis   . Recurrent UTI   . Chronic constipation   . Nausea   . Dementia   . Hypertension   . Murmur, cardiac   . Gait instability     Uses Deats or cane toambulate   Past Surgical History  Procedure Laterality Date  . Orif right hip  2004    Dr.Harrison  . Bilateral eyelid surgery    . Colonoscopy  10/30/04    NUR  . Vulvectomy N/A 06/14/2014    Procedure: PARTIAL MODIFED ANTERIOR RADICAL VULVECTOMY ;  Surgeon: Everitt Amber, MD;  Location: [facility];  Service: Gynecology;  Laterality: N/A;    PHYSICAL EXAMINATION:   GENERAL APPEARANCE:  The patient is not in any distress.   Her bed has an air mattress on it.          SKIN:   INSPECTION:  The area is just to the right of the lower sacral segments.  This seems to have some sort of bony enlargement.   There is no tenderness, no erythema, and no drainage while I was here.  The measurements, however, are artificial as she  has a large amount of overhang in virtually all directions, especially inferiorly.    ASSESSMENT/PLAN:                At least a stage III wound over her lower sacral area, as described.  I do not feel any bone.  However, the wound opening is small (about the size of a dime), but probes widely.  I think the next step here would be an MRI of this area to identify underlying osteomyelitis.  I think this is quite possible, perhaps even likely given the enlargement of the tissue just around this open area.  She would need IV antibiotics if this were positive.  The alternative is simply to continue with silver alginate dressings, which I do not think have a hope of healing this.  However, it might keep any ongoing infection at [place] for a while.  I noted previously that her albumin was  low at 3.2.  Apparently, she has been working on this since the recent albumin from 07/16/2015 was 3.6.

## 2015-08-03 ENCOUNTER — Other Ambulatory Visit (HOSPITAL_COMMUNITY): Payer: Medicare Other

## 2015-08-03 ENCOUNTER — Ambulatory Visit (HOSPITAL_COMMUNITY): Payer: Medicare Other

## 2015-08-03 ENCOUNTER — Ambulatory Visit (HOSPITAL_COMMUNITY): Admit: 2015-08-03 | Payer: Medicare Other

## 2015-08-04 ENCOUNTER — Ambulatory Visit (HOSPITAL_COMMUNITY): Payer: Medicare Other | Attending: Family Medicine

## 2015-08-04 DIAGNOSIS — L89159 Pressure ulcer of sacral region, unspecified stage: Secondary | ICD-10-CM | POA: Diagnosis present

## 2015-08-04 DIAGNOSIS — M533 Sacrococcygeal disorders, not elsewhere classified: Secondary | ICD-10-CM | POA: Diagnosis not present

## 2015-08-08 ENCOUNTER — Non-Acute Institutional Stay (SKILLED_NURSING_FACILITY): Payer: Medicare Other | Admitting: Internal Medicine

## 2015-08-08 DIAGNOSIS — S3210XS Unspecified fracture of sacrum, sequela: Secondary | ICD-10-CM | POA: Diagnosis not present

## 2015-08-08 DIAGNOSIS — I272 Other secondary pulmonary hypertension: Secondary | ICD-10-CM

## 2015-08-08 DIAGNOSIS — I509 Heart failure, unspecified: Secondary | ICD-10-CM

## 2015-08-08 DIAGNOSIS — L89150 Pressure ulcer of sacral region, unstageable: Secondary | ICD-10-CM

## 2015-08-08 DIAGNOSIS — I50810 Right heart failure, unspecified: Secondary | ICD-10-CM

## 2015-08-10 ENCOUNTER — Encounter (HOSPITAL_COMMUNITY)
Admission: RE | Admit: 2015-08-10 | Discharge: 2015-08-10 | Disposition: A | Discharge status: Home / Self Care | Payer: Medicare Other | Source: Skilled Nursing Facility | Attending: Family Medicine | Admitting: Family Medicine

## 2015-08-10 DIAGNOSIS — D500 Iron deficiency anemia secondary to blood loss (chronic): Secondary | ICD-10-CM | POA: Insufficient documentation

## 2015-08-10 DIAGNOSIS — I10 Essential (primary) hypertension: Secondary | ICD-10-CM | POA: Insufficient documentation

## 2015-08-10 DIAGNOSIS — L89150 Pressure ulcer of sacral region, unstageable: Secondary | ICD-10-CM | POA: Diagnosis not present

## 2015-08-10 DIAGNOSIS — Z79899 Other long term (current) drug therapy: Secondary | ICD-10-CM | POA: Diagnosis present

## 2015-08-10 LAB — BASIC METABOLIC PANEL
Anion gap: 11 (ref 5–15)
BUN: 19 mg/dL (ref 6–20)
CO2: 31 mmol/L (ref 22–32)
Calcium: 9.3 mg/dL (ref 8.9–10.3)
Chloride: 93 mmol/L — ABNORMAL LOW (ref 101–111)
Creatinine, Ser: 0.51 mg/dL (ref 0.44–1.00)
GFR calc Af Amer: >60 mL/min (ref >60)
Glucose, Bld: 109 mg/dL — ABNORMAL HIGH (ref 65–99)
POTASSIUM: 4.2 mmol/L (ref 3.5–5.1)
Sodium: 135 mmol/L (ref 135–145)

## 2015-08-11 LAB — VITAMIN D 25 HYDROXY (VIT D DEFICIENCY, FRACTURES): Vit D, 25-Hydroxy: 51.8 ng/mL (ref 30.0–100.0)

## 2015-08-13 NOTE — Progress Notes (Addendum)
Patient ID: [name], female   DOB: November 23, 1925, 81 y.o.   MRN: [id number]                HISTORY & PHYSICAL  DATE:  08/08/2015         FACILITY: [facility]                      LEVEL OF CARE:   SNF   CHIEF COMPLAINT:  Transfer to our service in the building.     HISTORY OF PRESENT ILLNESS:  This is a patient whom I have seen episodically for an area in her right pelvis just near her coccyx.  It was felt that this was a pressure ulceration with undermining.  She seemed to have some bony reaction here.  However, recent MRI did not show osteomyelitis; suggested an early possible superficial abscess, although I do not really think that is what it is.  The facility has been using silver alginate and, paradoxically, the area has actually closed over quite a bit since the last time I saw this.  Two separate cultures of the area have been done, which were negative.  Both of these were in October.    The patient came to this facility from home in early July.  She had fallen and had a fracture of the left superior pubic ramus.  She was slow to mobilize and has become an ongoing care patient in the facility.     PAST MEDICAL HISTORY/PROBLEM LIST:           Fracture of the superior pubic ramus.  Osteoperosis   UTI at the admission in July with Morganella.    History of hyponatremia.    History of C.difficile colitis.    Chronic dementia.    Marginal oral intake, apparently 25%, although she may have been doing some better recently and her albumin most recently was 3.6.    Glaucoma.     CURRENT MEDICATIONS:  Medication list is reviewed.              Flora-Q 1 daily Monday, Wednesday, and Friday.    Mighty Shakes once a day.     Colace 100 b.i.d.      ASA 81 q.d.       Lasix 20 q.d.      Osteo Bi-Flex 250 once a day.    KCl 20 q.d.       Xalatan ophthalmic 1 drop both eyes at bedtime.    Remeron 7.5 p.o. q.h.s.      SOCIAL HISTORY:                     HOUSING:  The patient was living next to her daughter in [place] in a trailer.  After the admission in July, she has remained in the facility.    FUNCTIONAL STATUS:  She required some assistance with ADLs by her family; otherwise, was functional during the day.    REVIEW OF SYSTEMS:       HEENT:   No headache.    CHEST/RESPIRATORY:  No shortness of breath.   CARDIAC:  No chest pain.    GI:  She has episodic abdominal pain.    GU:  No clear dysuria.    MUSCULOSKELETAL:  Some pain when she tries to get up and walk.   SKIN:  The pressure ulcer, as noted.     PHYSICAL EXAMINATION:   GENERAL APPEARANCE:  The  patient is not in any distress.          HEENT:   MOUTH/THROAT:   Her own teeth mostly.   No oral lesions are seen.    LYMPHATICS:  None palpable in the cervical, clavicular, or axillary areas.   BREASTS:  No obvious masses.    RESPIRATORY: A/E is farily good. No overt COPD CARDIOVASCULAR:   CARDIAC:  There is a 2/6 pansystolic murmur.  JVP is not elevated.  She appears to be euvolemic.      GASTROINTESTINAL:   ABDOMEN:  No masses.    LIVER/SPLEEN/KIDNEY:   No liver, no spleen.  No tenderness.    HERNIA:  She has a ventral hernia below the umbilicus, which is quite large.   GENITOURINARY:   BLADDER:  Not distended.  There is no CVA tenderness.    CIRCULATION:   EDEMA/VARICOSITIES:  Extremities:  Mild edema.   SKIN:   INSPECTION:  She has a pressure ulcer on her heel which is just about closed on the right.  The original wound was just to the right of the coccyx area over the pelvic bone.  Miraculously, this appears to be closing although I think it will close with an underlying deficit.   NEUROLOGICAL:  I see nothing obvious lateralizing here.   SENSATION/STRENGTH:  Strength in her lower extremities is mildly reduced.   DEEP TENDON REFLEXES:  Reflexes seem symmetric.   PSYCHIATRIC:   MENTAL STATUS:  She answered most of my questions.  Her daughter thinks she is confused and  possibly hallucinating and/or delusional.     ASSESSMENT/PLAN:                 Decubitus ulcer over the right pelvic area, as noted.  Miraculously, this is healing/closing although there still may be undermining.  An MRI, surprisingly, did not show osteomyelitis.  In spite of the thought of abscess, I do not think antibiotics are indicated at this point.  Her C-reactive protein was slightly elevated at 6.  I will follow this clinically over time.    Sacral insufficiency fractures on the MRI.  I wonder about her 25-hydroxy vitamin D level.  She, no doubt, has osteoporosis, looking at her. Needs a Dexa scan   Pansystolic murmur.  She had an echocardiogram done in July.  This showed normal left ventricular function.  She did, however, have a dilated right ventricle with normal systolic function, moderate to severe tricuspid regurge, and severe pulmonary hypertension.   Her pulmonary pressure was 83 mmHg.  I do not see an obvious reason for this.  A CT angiogram, done also in July, was negative for pulmonary embolism.  I will check her pulse ox's for the next several days.    Marginal oral intake.  Apparently, she has never been a big eater, per her daughter.    Dementia with some elements of psychosis.  I will need to observe this over time.    Protein calorie malnutrition.  This is significant, but her recent albumin increase from July to last month would suggest that we are on the right track here.    Mild anemia.  Most likely chronic disease.     Pressure ulceration.  Currently closing.  This indeed is miraculous.  At this point, I would simply observe this over time.  Iodosorb might be helpful to get this to close over, at least superficially.     Pulmonary hypertension: The cause here is not clear. She is not hypoxic  and did not have overt PE. Has clear Cor pulmonale vis echo but not clinical heart failure  Has "aged out" of most/all primary prevention.

## 2015-08-17 ENCOUNTER — Inpatient Hospital Stay (HOSPITAL_COMMUNITY): Admit: 2015-08-17 | Payer: Medicare Other

## 2015-09-16 ENCOUNTER — Encounter: Payer: Self-pay | Admitting: Internal Medicine

## 2015-09-16 ENCOUNTER — Ambulatory Visit (HOSPITAL_COMMUNITY)
Admission: RE | Admit: 2015-09-16 | Discharge: 2015-09-16 | Disposition: A | Discharge status: Home / Self Care | Payer: Medicare Other | Source: Ambulatory Visit | Attending: Internal Medicine | Admitting: Internal Medicine

## 2015-09-16 ENCOUNTER — Non-Acute Institutional Stay (SKILLED_NURSING_FACILITY): Payer: Medicare Other | Admitting: Internal Medicine

## 2015-09-16 DIAGNOSIS — I272 Other secondary pulmonary hypertension: Secondary | ICD-10-CM

## 2015-09-16 DIAGNOSIS — S3210XD Unspecified fracture of sacrum, subsequent encounter for fracture with routine healing: Secondary | ICD-10-CM | POA: Diagnosis not present

## 2015-09-16 DIAGNOSIS — R600 Localized edema: Secondary | ICD-10-CM | POA: Insufficient documentation

## 2015-09-16 DIAGNOSIS — F0391 Unspecified dementia with behavioral disturbance: Secondary | ICD-10-CM

## 2015-09-16 DIAGNOSIS — I5030 Unspecified diastolic (congestive) heart failure: Secondary | ICD-10-CM

## 2015-09-16 DIAGNOSIS — F0392 Unspecified dementia, unspecified severity, with psychotic disturbance: Secondary | ICD-10-CM | POA: Insufficient documentation

## 2015-09-16 DIAGNOSIS — R609 Edema, unspecified: Secondary | ICD-10-CM | POA: Insufficient documentation

## 2015-09-16 DIAGNOSIS — R590 Localized enlarged lymph nodes: Secondary | ICD-10-CM | POA: Diagnosis not present

## 2015-09-16 DIAGNOSIS — S3210XA Unspecified fracture of sacrum, initial encounter for closed fracture: Secondary | ICD-10-CM | POA: Insufficient documentation

## 2015-09-16 NOTE — Progress Notes (Signed)
Patient ID: [name], female   DOB: 1926-04-08, 81 y.o.   MRN: [id number]                  DATE:  09/16/2015         FACILITY: [facility]                      LEVEL OF CARE:   SNF   CHIEF COMPLAINT:  Acute visit secondary to right leg edema.  Medical management of chronic issues including. History of pelvic fracture-dementia-hypernatremia-diastolic CHF        History of present illness history of present illness    The patient came to this facility from home in early July.  She had fallen and had a fracture of the left superior pubic ramus.  She was slow to mobilize and has become an ongoing care patient in the facility She did have a sacral wound near her coccyx this was followed closely by wound care and Dr. Dellia Nims and apparently has resolved.  She does have significant dementia but appears to be doing relatively well with supportive care here currently not on any medication for dementia.  She continues on Remeron I suspect for appetite stimulation.  She is also on Lasix it appears for history of grade 1 diastolic dysfunction per review of cardiac echo in July 2016 her ejection fraction was 65-75%.  Nurse today did notice increased edema from baseline of her right leg-she does not complain of any pain with this she does have her legs often in a dependent position.  She is not complaining of any shortness of breath either-O2 saturations are satisfactory at 94% on room air.      PAST MEDICAL HISTORY/PROBLEM LIST:           Fracture of the superior pubic ramus.  Osteoperosis   UTI at the admission in July with Morganella.    History of hyponatremia.    History of C.difficile colitis.    Chronic dementia.    Marginal oral intake, apparently 25%, although she may have been doing some better recently and her albumin most recently was 3.6.    Glaucoma.     CURRENT MEDICATIONS:  Medication list is reviewed.              Flora-Q 1 daily Monday,  Wednesday, and Friday.    Mighty Shakes once a day.     Colace 100 b.i.d.      ASA 81 q.d.       Lasix 20 q.d.      Osteo Bi-Flex 250 once a day.    KCl 20 q.d.       Xalatan ophthalmic 1 drop both eyes at bedtime.    Remeron 7.5 p.o. q.h.s.      SOCIAL HISTORY:                   HOUSING:  The patient was living next to her daughter in [place] in a trailer.  After the admission in July, she has remained in the facility.    FUNCTIONAL STATUS:  She required some assistance with ADLs by her family; otherwise, was functional during the day.    REVIEW OF SYSTEMS: This is very limited secondary to dementia provided by patient and staff.  In general been no complaints of fever chills weight appears to be relatively stable possibly a small amount of weight gain over the past month about 3 pounds  HEENT:   No headache. No visual changes or sore throat    CHEST/RESPIRATORY:  No shortness of breath.   CARDIAC:  No chest pain has lower extremity edema apparently this is increased on the right.    GI:  She has episodic abdominal pain is not complaining of that today no nausea vomiting diarrhea constipation complaints.    GU:  No clear dysuria.    MUSCULOSKELETAL:  Some pain when she tries to get up and walk.   SKIN:  The pressure ulcer, as noted which apparently has resolved Neurologic does not complain of dizziness headache syncopal-type feelings.  Psych again does have significant dementia and suspect some element of depression she is on Remeron.     PHYSICAL EXAMINATION: Temperature 97.9 pulse 94 respirations 15 blood pressure 105/69 122/57   GENERAL APPEARANCE:  The patient is not in any distress sitting comfortably in her wheelchair.  Her skin is warm and dry.          HEENT:  She has prescription lenses visual acuity appears grossly intact-.  Oropharynx clear mucous membranes moist   Her own teeth mostly.   No oral lesions are seen.     Chest is clear to auscultation  there is no labored breathing CARDIOVASCULAR:   CARDIAC:  There is a 2/6 pansystolic murmur.  JVP is not elevated.  Largely regular rate and rhythm with occasional irregular beats.  She does have 2+ lower extremity on the right leg trace to 1+ on the left.  The edema on the right leg is nontender minimally erythematous not significantly warm-there is a palpable pedal pulse .      GASTROINTESTINAL:      HERNIA:  She has a ventral hernia below the umbilicus, which is quite large--abdomen is nontender there are positive bowel sounds.    .       NEUROLOGICAL:   nothing obvious lateralizing here.   SENSATION/STRENGTH:  Strength in her lower extremities is mildly reduced. Psych she is oriented to self does follow verbal commands is able to carry on conversation somewhat but has significant confusion  Labs.  08/10/2015.  Sodium 136 potassium 4.2 BUN 19 creatinine 0.5 one CO2 level XXXI.  Vitamin D level LI.  07/16/2015.  WBC 5.0 hemoglobin 11.8 platelets 284.  Albumin 3.6-liver function tests within normal limits except ALT of 10   D .     ASSESSMENT/PLAN: Unilateral increased edema-one would be concerned about a DVT here will order a venous Doppler to rule this out although she is not having significant pain there is no warmth or acute tenderness here will need to rule this out however                 Decubitus ulcer over the right pelvic area,   Apparently this has resolved    Sacral insufficiency fractures on the MRI.   At this point appears stabilized vitamin D level was 51 on recent lab   Pansystolic murmur.  She had an echocardiogram done in July.  This showed normal left ventricular function.  She did, however, have a dilated right ventricle with normal systolic function, moderate to severe tricuspid regurge, and severe pulmonary hypertension.   Grade 1 diastolic dysfunction-.    She is on low-dose Lasix with potassium supplementation Will update a metabolic panel  as well as a BNP --I suspect any recent weight gain was probably due to better appetite weight gain has not been precipitous    Marginal oral intake.  Apparently, she has never been a big eater, per her daughter. She has been*Remeron weight appears to be going up slowly    Dementia with some elements of psychosis. This appears to be fairly significant currently on no medication will have to see if she is followed by psych services I feel she would be a good candidate for this.    Protein calorie malnutrition.  This is significant, but her recent albumin increase from July to last month would suggest that we are on the right track here.    Mild anemia.  Most likely chronic disease i--will update CBC.            Pulmonary hypertension: The cause here is not clear. She is not hypoxic and did not have overt PE. Has clear Cor pulmonale vis echo but not clinical heart failure  Has "aged out" of most/all primary prevention.  [id number] note greater than 40 minutes spent assessing patient-reviewing her chart-discussing hher status with nursing staffand coordinating and formulating a plan of care for numerous diagnoses-of note greater than 50% of time spent coordinating plan of care

## 2015-09-17 ENCOUNTER — Encounter (HOSPITAL_COMMUNITY)
Admission: RE | Admit: 2015-09-17 | Discharge: 2015-09-17 | Disposition: A | Discharge status: Home / Self Care | Payer: Medicare Other | Source: Skilled Nursing Facility | Attending: Internal Medicine | Admitting: Internal Medicine

## 2015-09-17 DIAGNOSIS — I5042 Chronic combined systolic (congestive) and diastolic (congestive) heart failure: Secondary | ICD-10-CM | POA: Diagnosis present

## 2015-09-17 DIAGNOSIS — I10 Essential (primary) hypertension: Secondary | ICD-10-CM | POA: Insufficient documentation

## 2015-09-17 LAB — CBC WITH DIFFERENTIAL/PLATELET
Basophils Absolute: 0 10*3/uL (ref 0.0–0.1)
Basophils Relative: 0 %
EOS PCT: 2 %
Eosinophils Absolute: 0.2 10*3/uL (ref 0.0–0.7)
HCT: 36.1 % (ref 36.0–46.0)
Hemoglobin: 11.3 g/dL — ABNORMAL LOW (ref 12.0–15.0)
LYMPHS ABS: 0.7 10*3/uL (ref 0.7–4.0)
Lymphocytes Relative: 9 %
MCH: 25.7 pg — ABNORMAL LOW (ref 26.0–34.0)
MCHC: 31.3 g/dL (ref 30.0–36.0)
MCV: 82 fL (ref 78.0–100.0)
MONO ABS: 0.6 10*3/uL (ref 0.1–1.0)
Monocytes Relative: 7 %
Neutro Abs: 6.8 10*3/uL (ref 1.7–7.7)
Neutrophils Relative %: 82 %
PLATELETS: 418 10*3/uL — AB (ref 150–400)
RBC: 4.4 MIL/uL (ref 3.87–5.11)
RDW: 15.9 % — ABNORMAL HIGH (ref 11.5–15.5)
WBC: 8.3 10*3/uL (ref 4.0–10.5)

## 2015-09-17 LAB — BASIC METABOLIC PANEL
Anion gap: 8 (ref 5–15)
BUN: 23 mg/dL — AB (ref 6–20)
CO2: 32 mmol/L (ref 22–32)
CREATININE: 0.48 mg/dL (ref 0.44–1.00)
Calcium: 8.8 mg/dL — ABNORMAL LOW (ref 8.9–10.3)
Chloride: 94 mmol/L — ABNORMAL LOW (ref 101–111)
GFR calc Af Amer: >60 mL/min (ref >60)
GFR calc non Af Amer: >60 mL/min (ref >60)
Glucose, Bld: 103 mg/dL — ABNORMAL HIGH (ref 65–99)
POTASSIUM: 4.3 mmol/L (ref 3.5–5.1)
SODIUM: 134 mmol/L — AB (ref 135–145)

## 2015-09-17 LAB — BRAIN NATRIURETIC PEPTIDE: B Natriuretic Peptide: 300 pg/mL — ABNORMAL HIGH (ref 0.0–100.0)

## 2015-09-21 ENCOUNTER — Encounter: Payer: Self-pay | Admitting: Internal Medicine

## 2015-09-21 ENCOUNTER — Non-Acute Institutional Stay (SKILLED_NURSING_FACILITY): Payer: Medicare Other | Admitting: Internal Medicine

## 2015-09-21 DIAGNOSIS — R609 Edema, unspecified: Secondary | ICD-10-CM

## 2015-09-21 NOTE — Progress Notes (Signed)
Patient ID: [name], female   DOB: 08/29/1926, 81 y.o.   MRN: [id number]                   DATE:  09/21/2015         FACILITY: [facility]                      LEVEL OF CARE:   SNF   CHIEF COMPLAINT:  Acute visit follow-up edema.       History of present illness     The patient came to this facility from home in early July.  She had fallen and had a fracture of the left superior pubic ramus.  She was slow to mobilize and has become an ongoing care patient in the facility  seen recently for some increase right leg  edema-we did do a venous Doppler which is negative for DVT.  Edema has gotten somewhat better-leg elevation as been encouraged-she does have a history of grade 1 diastolic CHF as well.  Per discussion with nursing today this edema appears to be sent dependent related -when lying down tends at times to go more to her buttocks area-she continues to be nonerythematous nontender patient does not really have any complaints.       Marland Kitchen      PAST MEDICAL HISTORY/PROBLEM LIST:           Fracture of the superior pubic ramus.  Osteoperosis   UTI at the admission in July with Morganella.    History of hyponatremia.    History of C.difficile colitis.    Chronic dementia.    Marginal oral intake, apparently 25%, although she may have been doing some better recently and her albumin most recently was 3.6.    Glaucoma.     CURRENT MEDICATIONS:  Medication list is reviewed.              Flora-Q 1 daily Monday, Wednesday, and Friday.    Mighty Shakes once a day.     Colace 100 b.i.d.      ASA 81 q.d.       Lasix 20 q.d.     Osteo Bi-Flex 250 once a day.    KCl 20 q.d.       Xalatan ophthalmic 1 drop both eyes at bedtime.    Remeron 7.5 p.o. q.h.s.      SOCIAL HISTORY:                   HOUSING:  The patient was living next to her daughter in [place] in a trailer.  After the admission in July, she has remained in the facility.      FUNCTIONAL STATUS:  She required some assistance with ADLs by her family; otherwise, was functional during the day.    REVIEW OF SYSTEMS: This is very limited secondary to dementia provided by patient and staff.  In general been no complaints of fever chills weight appears to be relatively stable possibly a small amount of weight gain over the past month about 3 pounds       HEENT:   No headache. No visual changes or sore throat    CHEST/RESPIRATORY:  No shortness of breath.   CARDIAC:  No chest pain has lower extremity edema apparently on the right    GI:  She has episodic abdominal pain is not complaining of that today no nausea vomiting diarrhea constipation complaints.    GU:  No clear dysuria.    MUSCULOSKELETAL:  Some pain when she tries to get up and walk.   SKIN:  The pressure ulcer, as noted which apparently has resolved Neurologic does not complain of dizziness headache syncopal-type feelings.  Psych again does have significant dementia and suspect some element of depression she is on Remeron.     PHYSICAL EXAMINATION: Dr. 97.6 pulse 80 respirations 18 blood pressure 136/61   GENERAL APPEARANCE:  The patient is not in any distress sitting comfortably in her wheelchair.  Her skin is warm and dry.          HEENT:  She has prescription lenses visual acuity appears grossly intact-.  Oropharynx clear mucous membranes moist   Her own teeth mostly.   No oral lesions are seen.     Chest is clear to auscultation there is no labored breathing CARDIOVASCULAR:   CARDIAC:  There is a 2/6 pansystolic murmur.  JVP is not elevated.  Largely regular rate and rhythm with occasional irregular beats.  She does have 1-2+ lower extremity on the right leg trace to 1+ on the left.--Edema on the right does appear to be somewhat improved  The edema on the right leg is nontender minimally erythematous not significantly warm-there is a palpable pedal pulse   .      GASTROINTESTINAL:       HERNIA:  She has a ventral hernia below the umbilicus, which is quite large--abdomen is nontender there are positive bowel sounds.    .       NEUROLOGICAL:   nothing obvious lateralizing here.   SENSATION/STRENGTH:  Strength in her lower extremities is mildly reduced. Psych she is oriented to self does follow verbal commands is able to carry on conversation somewhat but has significant confusion  Labs.  09/17/2015.  Sodium 134 potassium 4.3 BUN 23 creatinine 0.48.  BNP-300.  WBC 8.3 hemoglobin 11.3 platelets 418.  I note in October albumin was 3.6    .     ASSESSMENT/PLAN:    Right lower extremity edema-this is somewhat improved although still persists and appears to move up into her buttocks and thorax more when she is lying-will obtain a chest x-ray to rule out any pulmonary edema although her BNP is not that remarkable.                                                           She continues on Lasix with a history of diastolic CHF.                                                                                                                 clinically she appears to be stable at her baseline. We will check an albumin level as well  [id number] l.    -.

## 2015-09-22 ENCOUNTER — Encounter (HOSPITAL_COMMUNITY)
Admission: AD | Admit: 2015-09-22 | Discharge: 2015-09-22 | Disposition: A | Discharge status: Home / Self Care | Payer: Medicare Other | Source: Skilled Nursing Facility | Attending: Internal Medicine | Admitting: Internal Medicine

## 2015-09-22 ENCOUNTER — Ambulatory Visit (HOSPITAL_COMMUNITY)
Admit: 2015-09-22 | Discharge: 2015-09-22 | Disposition: A | Discharge status: Home / Self Care | Payer: Medicare Other | Source: Skilled Nursing Facility | Attending: Internal Medicine | Admitting: Internal Medicine

## 2015-09-22 DIAGNOSIS — I509 Heart failure, unspecified: Secondary | ICD-10-CM | POA: Diagnosis present

## 2015-09-22 DIAGNOSIS — J439 Emphysema, unspecified: Secondary | ICD-10-CM | POA: Insufficient documentation

## 2015-09-22 DIAGNOSIS — J449 Chronic obstructive pulmonary disease, unspecified: Secondary | ICD-10-CM | POA: Diagnosis not present

## 2015-09-22 DIAGNOSIS — I5042 Chronic combined systolic (congestive) and diastolic (congestive) heart failure: Secondary | ICD-10-CM | POA: Diagnosis not present

## 2015-09-22 LAB — ALBUMIN: ALBUMIN: 3.1 g/dL — AB (ref 3.5–5.0)

## 2015-11-23 ENCOUNTER — Encounter (INDEPENDENT_AMBULATORY_CARE_PROVIDER_SITE_OTHER): Payer: Self-pay | Admitting: Internal Medicine

## 2015-12-05 ENCOUNTER — Other Ambulatory Visit (HOSPITAL_COMMUNITY)
Admission: RE | Admit: 2015-12-05 | Discharge: 2015-12-05 | Disposition: A | Discharge status: Home / Self Care | Payer: Medicare Other | Source: Skilled Nursing Facility | Attending: Internal Medicine | Admitting: Internal Medicine

## 2015-12-05 ENCOUNTER — Non-Acute Institutional Stay (SKILLED_NURSING_FACILITY): Payer: Medicare Other | Admitting: Internal Medicine

## 2015-12-05 ENCOUNTER — Ambulatory Visit (HOSPITAL_COMMUNITY)
Admit: 2015-12-05 | Discharge: 2015-12-05 | Disposition: A | Discharge status: Home / Self Care | Payer: Medicare Other | Source: Ambulatory Visit | Attending: Internal Medicine | Admitting: Internal Medicine

## 2015-12-05 DIAGNOSIS — J189 Pneumonia, unspecified organism: Secondary | ICD-10-CM | POA: Insufficient documentation

## 2015-12-05 DIAGNOSIS — R531 Weakness: Secondary | ICD-10-CM | POA: Insufficient documentation

## 2015-12-05 DIAGNOSIS — B349 Viral infection, unspecified: Secondary | ICD-10-CM | POA: Insufficient documentation

## 2015-12-05 DIAGNOSIS — J441 Chronic obstructive pulmonary disease with (acute) exacerbation: Secondary | ICD-10-CM | POA: Insufficient documentation

## 2015-12-05 DIAGNOSIS — D638 Anemia in other chronic diseases classified elsewhere: Secondary | ICD-10-CM | POA: Diagnosis not present

## 2015-12-05 DIAGNOSIS — I5032 Chronic diastolic (congestive) heart failure: Secondary | ICD-10-CM | POA: Diagnosis not present

## 2015-12-05 DIAGNOSIS — S3210XD Unspecified fracture of sacrum, subsequent encounter for fracture with routine healing: Secondary | ICD-10-CM

## 2015-12-05 DIAGNOSIS — F0391 Unspecified dementia with behavioral disturbance: Secondary | ICD-10-CM | POA: Diagnosis not present

## 2015-12-05 DIAGNOSIS — R05 Cough: Secondary | ICD-10-CM | POA: Diagnosis not present

## 2015-12-05 DIAGNOSIS — J181 Lobar pneumonia, unspecified organism: Principal | ICD-10-CM

## 2015-12-05 DIAGNOSIS — F0392 Unspecified dementia, unspecified severity, with psychotic disturbance: Secondary | ICD-10-CM

## 2015-12-05 LAB — INFLUENZA PANEL BY PCR (TYPE A & B)
H1N1FLUPCR: NOT DETECTED
INFLAPCR: NEGATIVE
Influenza B By PCR: NEGATIVE

## 2015-12-05 NOTE — Progress Notes (Signed)
Patient ID: [name], female   DOB: 01/28/1926, 82 y.o.   MRN: [id number]                   This is a routine-acute visit        FACILITY: [facility]:   SNF   CHIEF COMPLAINT:  Acute visit secondary to right leg edema.  Medical management of chronic issues including. History of pelvic fracture-dementia-hypernatremia-diastolic CHF  Acute visit secondary to pneumonia        History of present illness   The patient came to this facility from home in early July.  She had fallen and had a fracture of the left superior pubic ramus.  She was slow to mobilize and has become an ongoing care patient in the facility She did have a sacral wound near her coccyx this was followed closely by wound care and Dr. Dellia Nims and apparently has resolved.  She does have significant dementia but appears to be doing relatively well with supportive care here currently not on any medication for dementia.  She continues on Remeron I suspect for appetite stimulation.  She is also on Lasix it appears for history of grade 1 diastolic dysfunction per review of cardiac echo in July 2016 her ejection fraction was 65-75%. --This continues to be stable  Most acute issue recently was a low-grade temperature of 100.0-there was fluid the facility but her flu swab was negative-however chest x-ray did show a right upper lobe pneumonia superimposed on suspected COPD emphysema.  Currently her vital signs are stable O2 saturation is in the 90s on room air there is no sign of distress she does have somewhat of a cough however.  Otherwise patient has no complaints.      PAST MEDICAL HISTORY/PROBLEM LIST:           Fracture of the superior pubic ramus.  Osteoperosis   UTI at the admission in July with Morganella.    History of hyponatremia.    History of C.difficile colitis.    Chronic dementia.    Marginal oral intake, apparently 25%, although she may  have been doing some better recently and her albumin most recently was 3.6.    Glaucoma.     CURRENT MEDICATIONS:  Medication list is reviewed.              Flora-Q 1 daily Monday, Wednesday, and Friday.    Ensure twice a day.    Mighty shake daily.    Pro-stat  Colace 100 b.i.d.      ASA 81 q.d.       Lasix 20 q.d.      Osteo Bi-Flex 250 once a day.    KCl 20 q.d.       Xalatan ophthalmic 1 drop both eyes at bedtime.    Remeron 7.5 p.o. Q.h.s.  Xanax 0.25 mg every 4 hours when necessary.    Tylenol 500 mg twice a day.    Tramadol 50 mg daily.    And every 4 hours when necessary      SOCIAL HISTORY:                   HOUSING:  The patient was living next to her daughter in [place] in a trailer.  After the admission in July, she has remained in the facility.  FUNCTIONAL STATUS:  She required some assistance with ADLs by her family; otherwise, was functional during the day.    REVIEW OF SYSTEMS: This is very limited secondary to dementia provided by patient and staff.  In general she is afebrile apparently did have a low-grade fever previously no chills     HEENT:   No headache. No visual changes or sore throat    CHEST/RESPIRATORY:  No shortness of breath apparently has had a cough-again recent diagnosis of pneumonia   CARDIAC:  No chest pain has lower extremity edema apparently this is increased on the right.    GI:   No reports of abdominal pain nausea vomiting    GU:  No clear dysuria.    MUSCULOSKELETAL:  History of pelvic pain which apparently has gotten better  SKIN:  The pressure ulcer, as noted which apparently has resolved Neurologic does not complain of dizziness headache syncopal-type feelings.  Psych again does have significant dementia and suspect some element of depression she is on Remeron.     PHYSICAL EXAMINATION:  Temperature 98.1 pulse 63 respirations 20 blood pressure 97/53 O2 saturation 92% on room air  GENERAL  APPEARANCE:  The patient is not in any distress   Her skin is warm and dry.          HEENT:  She has prescription lenses visual acuity appears grossly intact-.  Oropharynx clear mucous membranes moist   Her own teeth mostly.   No oral lesions are seen.     Chest  there is no labored breathing--has some  slightrhonchi right lung fields CARDIOVASCULAR:   CARDIAC:  There is a 2/6 pansystolic murmur.  JVP is not elevated.  Largely regular rate and rhythm with occasional irregular beats.  She has mild lower extremity edema bilaterally    .      GASTROINTESTINAL:      HERNIA:  She has a ventral hernia below the umbilicus, which is quite large--abdomen is nontender there are positive bowel sounds.    .       NEUROLOGICAL:   Grossly intact no overt lateralizing findings her speech is clear.   SENSATION/STRENGTH:  Strength in her lower extremities is mildly reduced. Psych she is oriented to self does follow verbal commands is able to carry on conversation somewhat but has significant confusion-this is baseline with previous exams  Labs.  09/22/2015.  Albumin 3.1.  09/17/2015  Sodium 134 potassium 4.3 BUN 23 creatinine 0.48 CO2 level 32  BNP 300.  WBC 8.3 hemoglobin 11.3 platelets 418.    08/10/2015.  Sodium 136 potassium 4.2 BUN 19 creatinine 0.5 one CO2 level XXXI.  Vitamin D level 51.8 07/16/2015.  WBC 5.0 hemoglobin 11.8 platelets 284.  Albumin 3.6-liver function tests within normal limits except ALT of 10   D .     ASSESSMENT/PLAN:  Right upper lobe pneumonia-will start Augmentin 500 mg twice a day for 7 days-also will add a probiotic 4*twice a day for 10 days-monitor closely vital signs pulse ox every shift for 72 hours.  Also will start Mucinex 600 mg twice a day for 7 days as an expectorant.  She also will nebs every 6 hours when necessary for 5 days.                   Decubitus ulcer over the right pelvic area,   Apparently this has resolved     Sacral insufficiency fractures on the MRI.   At this point appears stabilized vitamin D  level was 51 on recent lab --for pain issues this appears stable she does receive Tylenol routinely as well as Ultram it appears once a day as well as when necessary.      Pansystolic murmur.  She had an echocardiogram done in July.  This showed normal left ventricular function.  She did, however, have a dilated right ventricle with normal systolic function, moderate to severe tricuspid regurge, and severe pulmonary hypertension.   Grade 1 diastolic dysfunction-.    She is on low-dose Lasix with potassium supplementation Will update a metabolic panel --this appears to be stable and has been for some time which is encouraging          Dementia with some elements of psychosis.   This actually is been quite stable she has done well with supportive care-she continues on Xanax as needed for anxiety   Protein calorie malnutrition.  She is on numerous supplements albumin level actually appears to be trended down a bit at 3.1 on lab done in December we'll update this  Mild anemia.  Most likely chronic disease i--will update CBC.            Pulmonary hypertension: The cause here is not clear. She is not hypoxic and did not have overt PE. Has clear Cor pulmonale vis echo but not clinical heart failure  Has "aged out" of most/all primary prevention.  [id number] note greater than 35 minutes spent assessing patient-reviewing her chart-discussing hher status with nursing staffand coordinating and formulating a plan of care for numerous diagnoses-of note greater than 50% of time spent coordinating plan of care

## 2015-12-06 ENCOUNTER — Encounter (HOSPITAL_COMMUNITY)
Admission: AD | Admit: 2015-12-06 | Discharge: 2015-12-06 | Disposition: A | Discharge status: Home / Self Care | Payer: Medicare Other | Source: Skilled Nursing Facility | Attending: Internal Medicine | Admitting: Internal Medicine

## 2015-12-06 DIAGNOSIS — J1100 Influenza due to unidentified influenza virus with unspecified type of pneumonia: Secondary | ICD-10-CM | POA: Diagnosis present

## 2015-12-06 LAB — COMPREHENSIVE METABOLIC PANEL
ALT: 11 U/L — AB (ref 14–54)
AST: 17 U/L (ref 15–41)
Albumin: 3.4 g/dL — ABNORMAL LOW (ref 3.5–5.0)
Alkaline Phosphatase: 64 U/L (ref 38–126)
Anion gap: 8 (ref 5–15)
BILIRUBIN TOTAL: 0.7 mg/dL (ref 0.3–1.2)
BUN: 30 mg/dL — AB (ref 6–20)
CALCIUM: 8.7 mg/dL — AB (ref 8.9–10.3)
CO2: 31 mmol/L (ref 22–32)
CREATININE: 0.6 mg/dL (ref 0.44–1.00)
Chloride: 96 mmol/L — ABNORMAL LOW (ref 101–111)
Glucose, Bld: 109 mg/dL — ABNORMAL HIGH (ref 65–99)
Potassium: 4.4 mmol/L (ref 3.5–5.1)
Sodium: 135 mmol/L (ref 135–145)
TOTAL PROTEIN: 6.5 g/dL (ref 6.5–8.1)

## 2015-12-06 LAB — CBC WITH DIFFERENTIAL/PLATELET
BASOS ABS: 0 10*3/uL (ref 0.0–0.1)
BASOS PCT: 0 %
EOS ABS: 0 10*3/uL (ref 0.0–0.7)
EOS PCT: 0 %
HCT: 37.1 % (ref 36.0–46.0)
Hemoglobin: 12 g/dL (ref 12.0–15.0)
Lymphocytes Relative: 7 %
Lymphs Abs: 0.9 10*3/uL (ref 0.7–4.0)
MCH: 27.9 pg (ref 26.0–34.0)
MCHC: 32.3 g/dL (ref 30.0–36.0)
MCV: 86.3 fL (ref 78.0–100.0)
Monocytes Absolute: 0.9 10*3/uL (ref 0.1–1.0)
Monocytes Relative: 6 %
Neutro Abs: 11.7 10*3/uL — ABNORMAL HIGH (ref 1.7–7.7)
Neutrophils Relative %: 87 %
PLATELETS: 186 10*3/uL (ref 150–400)
RBC: 4.3 MIL/uL (ref 3.87–5.11)
RDW: 17.7 % — ABNORMAL HIGH (ref 11.5–15.5)
WBC: 13.4 10*3/uL — AB (ref 4.0–10.5)

## 2015-12-08 ENCOUNTER — Encounter (HOSPITAL_COMMUNITY)
Admission: AD | Admit: 2015-12-08 | Discharge: 2015-12-08 | Disposition: A | Discharge status: Home / Self Care | Payer: Medicare Other | Source: Skilled Nursing Facility | Attending: Internal Medicine | Admitting: Internal Medicine

## 2015-12-08 DIAGNOSIS — J1100 Influenza due to unidentified influenza virus with unspecified type of pneumonia: Secondary | ICD-10-CM | POA: Diagnosis not present

## 2015-12-08 LAB — CBC WITH DIFFERENTIAL/PLATELET
BASOS ABS: 0 10*3/uL (ref 0.0–0.1)
BASOS PCT: 0 %
Eosinophils Absolute: 0.2 10*3/uL (ref 0.0–0.7)
Eosinophils Relative: 3 %
HEMATOCRIT: 37.3 % (ref 36.0–46.0)
HEMOGLOBIN: 12 g/dL (ref 12.0–15.0)
Lymphocytes Relative: 13 %
Lymphs Abs: 0.8 10*3/uL (ref 0.7–4.0)
MCH: 27.8 pg (ref 26.0–34.0)
MCHC: 32.2 g/dL (ref 30.0–36.0)
MCV: 86.3 fL (ref 78.0–100.0)
MONOS PCT: 7 %
Monocytes Absolute: 0.4 10*3/uL (ref 0.1–1.0)
NEUTROS ABS: 4.6 10*3/uL (ref 1.7–7.7)
NEUTROS PCT: 77 %
Platelets: 199 10*3/uL (ref 150–400)
RBC: 4.32 MIL/uL (ref 3.87–5.11)
RDW: 17 % — ABNORMAL HIGH (ref 11.5–15.5)
WBC: 5.9 10*3/uL (ref 4.0–10.5)

## 2015-12-10 ENCOUNTER — Encounter: Payer: Self-pay | Admitting: Internal Medicine

## 2015-12-10 DIAGNOSIS — J189 Pneumonia, unspecified organism: Secondary | ICD-10-CM | POA: Insufficient documentation

## 2015-12-13 ENCOUNTER — Ambulatory Visit (HOSPITAL_COMMUNITY): Payer: Medicare Other | Attending: Internal Medicine

## 2015-12-13 DIAGNOSIS — M4184 Other forms of scoliosis, thoracic region: Secondary | ICD-10-CM | POA: Insufficient documentation

## 2015-12-13 DIAGNOSIS — M8588 Other specified disorders of bone density and structure, other site: Secondary | ICD-10-CM | POA: Insufficient documentation

## 2015-12-13 DIAGNOSIS — R52 Pain, unspecified: Secondary | ICD-10-CM | POA: Diagnosis present

## 2015-12-13 DIAGNOSIS — M4186 Other forms of scoliosis, lumbar region: Secondary | ICD-10-CM | POA: Insufficient documentation

## 2015-12-13 DIAGNOSIS — M80051S Age-related osteoporosis with current pathological fracture, right femur, sequela: Secondary | ICD-10-CM | POA: Insufficient documentation

## 2015-12-13 DIAGNOSIS — M545 Low back pain: Secondary | ICD-10-CM | POA: Insufficient documentation

## 2015-12-13 DIAGNOSIS — M80052S Age-related osteoporosis with current pathological fracture, left femur, sequela: Secondary | ICD-10-CM | POA: Insufficient documentation

## 2015-12-28 ENCOUNTER — Encounter (HOSPITAL_COMMUNITY): Payer: Self-pay

## 2016-01-06 ENCOUNTER — Encounter (HOSPITAL_COMMUNITY)
Admission: RE | Admit: 2016-01-06 | Discharge: 2016-01-06 | Disposition: A | Discharge status: Home / Self Care | Payer: Medicare Other | Source: Skilled Nursing Facility | Attending: Internal Medicine | Admitting: Internal Medicine

## 2016-01-06 DIAGNOSIS — Z4789 Encounter for other orthopedic aftercare: Secondary | ICD-10-CM | POA: Diagnosis not present

## 2016-01-06 DIAGNOSIS — X58XXXD Exposure to other specified factors, subsequent encounter: Secondary | ICD-10-CM | POA: Diagnosis not present

## 2016-01-06 DIAGNOSIS — L89150 Pressure ulcer of sacral region, unstageable: Secondary | ICD-10-CM | POA: Insufficient documentation

## 2016-01-06 DIAGNOSIS — M1990 Unspecified osteoarthritis, unspecified site: Secondary | ICD-10-CM | POA: Insufficient documentation

## 2016-01-06 DIAGNOSIS — S32592D Other specified fracture of left pubis, subsequent encounter for fracture with routine healing: Secondary | ICD-10-CM | POA: Diagnosis not present

## 2016-01-06 DIAGNOSIS — J1100 Influenza due to unidentified influenza virus with unspecified type of pneumonia: Secondary | ICD-10-CM | POA: Insufficient documentation

## 2016-01-06 LAB — CBC WITH DIFFERENTIAL/PLATELET
BASOS ABS: 0 10*3/uL (ref 0.0–0.1)
BASOS PCT: 0 %
EOS ABS: 0.1 10*3/uL (ref 0.0–0.7)
EOS PCT: 2 %
HCT: 42.6 % (ref 36.0–46.0)
Hemoglobin: 13.6 g/dL (ref 12.0–15.0)
Lymphocytes Relative: 9 %
Lymphs Abs: 0.7 10*3/uL (ref 0.7–4.0)
MCH: 28.3 pg (ref 26.0–34.0)
MCHC: 31.9 g/dL (ref 30.0–36.0)
MCV: 88.6 fL (ref 78.0–100.0)
Monocytes Absolute: 0.3 10*3/uL (ref 0.1–1.0)
Monocytes Relative: 4 %
NEUTROS PCT: 85 %
Neutro Abs: 6.7 10*3/uL (ref 1.7–7.7)
PLATELETS: 213 10*3/uL (ref 150–400)
RBC: 4.81 MIL/uL (ref 3.87–5.11)
RDW: 15.9 % — ABNORMAL HIGH (ref 11.5–15.5)
WBC: 7.7 10*3/uL (ref 4.0–10.5)

## 2016-01-06 LAB — COMPREHENSIVE METABOLIC PANEL
ALBUMIN: 4.2 g/dL (ref 3.5–5.0)
ALT: 12 U/L — AB (ref 14–54)
AST: 17 U/L (ref 15–41)
Alkaline Phosphatase: 99 U/L (ref 38–126)
Anion gap: 9 (ref 5–15)
BUN: 24 mg/dL — AB (ref 6–20)
CHLORIDE: 94 mmol/L — AB (ref 101–111)
CO2: 34 mmol/L — AB (ref 22–32)
CREATININE: 0.48 mg/dL (ref 0.44–1.00)
Calcium: 9.2 mg/dL (ref 8.9–10.3)
GFR calc non Af Amer: >60 mL/min (ref >60)
GLUCOSE: 106 mg/dL — AB (ref 65–99)
Potassium: 4.7 mmol/L (ref 3.5–5.1)
SODIUM: 137 mmol/L (ref 135–145)
Total Bilirubin: 0.4 mg/dL (ref 0.3–1.2)
Total Protein: 7.3 g/dL (ref 6.5–8.1)

## 2016-01-11 ENCOUNTER — Non-Acute Institutional Stay (SKILLED_NURSING_FACILITY): Payer: Medicare Other | Admitting: Internal Medicine

## 2016-01-11 ENCOUNTER — Encounter: Payer: Self-pay | Admitting: Internal Medicine

## 2016-01-11 DIAGNOSIS — S3210XD Unspecified fracture of sacrum, subsequent encounter for fracture with routine healing: Secondary | ICD-10-CM

## 2016-01-11 DIAGNOSIS — F0392 Unspecified dementia, unspecified severity, with psychotic disturbance: Secondary | ICD-10-CM

## 2016-01-11 DIAGNOSIS — F0391 Unspecified dementia with behavioral disturbance: Secondary | ICD-10-CM | POA: Diagnosis not present

## 2016-01-11 DIAGNOSIS — R609 Edema, unspecified: Secondary | ICD-10-CM | POA: Diagnosis not present

## 2016-01-11 NOTE — Progress Notes (Signed)
Patient ID: [name], female   DOB: June 28, 1926, 82 y.o.   MRN: [id number]

## 2016-01-11 NOTE — Progress Notes (Signed)
Patient ID: [name], female   DOB: 05-07-1926, 82 y.o.   MRN: [id number]                       This is a routine- visit        FACILITY: [facility]:   SNF   CHIEF COMPLAINT: .  Medical management of chronic issues including. History of pelvic fracture-dementia-hypernatremia-diastolic CHF          History of present illness   The patient came to this facility from home in early July.  She had fallen and had a fracture of the left superior pubic ramus.  She was slow to mobilize and has become an ongoing care patient in the facility She did have a sacral wound near her coccyx this was followed closely by wound care and Dr. Dellia Nims and apparently has resolved.  She does have significant dementia but appears to be doing relatively well with supportive care here currently not on any medication for dementia.  She continues on Remeron I suspect for appetite stimulation.--According to nursing she is eating better has gained some weight  She is also on Lasix it appears for history of grade 1 diastolic dysfunction per review of cardiac echo in July 2016 her ejection fraction was 65-75%. --This continues to be stable  Most acute issue recently was a low-grade temperature of 100.0-there was flu the facility but her flu swab was negative-however chest x-ray did show a right upper lobe pneumonia superimposed on suspected COPD emphysema.   This responded to a course of antibiotics she has been afebrile and no cough she appears to be doing well and rolling   about the facility pleasantly confused      PAST MEDICAL HISTORY/PROBLEM LIST:           Fracture of the superior pubic ramus.  Osteoperosis   UTI at the admission in July with Morganella.    History of hyponatremia.    History of C.difficile colitis.    Chronic dementia.    Marginal oral intake, apparently 25%, although she may have been doing some better recently  and her albumin most recently was 3.6.    Glaucoma.     CURRENT MEDICATIONS:  Medication list is reviewed.              Flora-Q 1 daily Monday, Wednesday, and Friday.    Ensure twice a day.    Mighty shake daily.    Pro-stat  Colace 100 b.i.d.      ASA 81 q.d.       Lasix 20 q.d.      Osteo Bi-Flex 250 once a day.    KCl 20 q.d.       Xalatan ophthalmic 1 drop both eyes at bedtime.    Remeron 7.5 p.o. Q.h.s.  Xanax 0.25 mg every 4 hours when necessary.    Tylenol 500 mg twice a day.    Tramadol 50 mg daily.    And every 4 hours when necessary      SOCIAL HISTORY:                   HOUSING:  The patient was living next to her daughter in [place] in a trailer.  After the admission in July, she has remained in the facility.  FUNCTIONAL STATUS:  She required some assistance with ADLs by her family; otherwise, was functional during the day.    REVIEW OF SYSTEMS: This is very limited secondary to dementia provided by patient and staff.  In general no fever or chills     HEENT:   No headache. No visual changes or sore throat    CHEST/RESPIRATORY:  No shortness of breath or cough CARDIAC:  No chest pain mild lower extremity edema I would say trace    GI:   No reports of abdominal pain nausea vomiting    GU:  No clear dysuria.    MUSCULOSKELETAL:  History of pelvic pain which apparently has resoved SKIN:  The pressure ulcer, as noted which apparently has resolved Neurologic does not complain of dizziness headache syncopal-type feelings.  Psych again does have significant dementia and suspect some element of depression she is on Remeron--and actually appears to be doing quite well now.     PHYSICAL EXAMINATION:  Temperature 97.9 pulse 66 respirations 15 blood pressure 117/55 weight is 99.4 this appears stable to slight gain  GENERAL APPEARANCE:  The patient is not in any distress   Her skin is warm and dry.          HEENT:  She has prescription  lenses visual acuity appears grossly intact-.  Oropharynx clear mucous membranes moist   Her own teeth mostly.   No oral lesions are seen.     Chest  there is no labored breathing--clear to auscultation CARDIOVASCULAR:   CARDIAC:  There is a 2/6 pansystolic murmur.  JVP is not elevated.   regular rate and rhythm with occasional irregular beats.     Marland Kitchen      GASTROINTESTINAL:      HERNIA:  She has a ventral hernia below the umbilicus, which is quite large--abdomen is nontender there are positive bowel sounds.    .       NEUROLOGICAL:   Grossly intact no overt lateralizing findings her speech is clear.   SENSATION/STRENGTH:  Strength in her lower extremities is mildly reduced. Psych she is oriented to self does follow verbal commands is able to carry on conversation somewhat but has significant confusion-this is baseline with previous exams she is pleasant  Labs.   01/06/2016.  WBC 7.7 hemoglobin 13.6 platelets 213.  Sodium 137 potassium 4.7 BUN 24 creatinine 0.48.  Albumin 4.2 which isn't increased.  ALT 12.  Otherwise liver function tests within normal limits  09/22/2015.  Albumin 3.1.  09/17/2015  Sodium 134 potassium 4.3 BUN 23 creatinine 0.48 CO2 level 32  BNP 300.  WBC 8.3 hemoglobin 11.3 platelets 418.    08/10/2015.  Sodium 136 potassium 4.2 BUN 19 creatinine 0.5 one CO2 level XXXI.  Vitamin D level 51.8 07/16/2015.  WBC 5.0 hemoglobin 11.8 platelets 284.  Albumin 3.6-liver function tests within normal limits except ALT of 10   D .     ASSESSMENT/PLAN:                       Sacral insufficiency fractures on the MRI.   At this point appears stabilized vitamin D level was 51 on recent lab --for pain issues this appears stable she does receive Tylenol routinely as well as Ultram it appears once a day as well as when necessary.      Pansystolic murmur.  She had an echocardiogram done in July.  This showed normal left ventricular  function.  She did, however, have a  dilated right ventricle with normal systolic function, moderate to severe tricuspid regurge, and severe pulmonary hypertension.   Grade 1 diastolic dysfunction-.                                                           Edema actually appears improved I suspect weight gain is due to better appetite-recent metabolic panel appears stable She is on low-dose Lasix with potassium supplementation           Dementia with some elements of psychosis.   This actually is been quite stable she has done well with supportive care-she continues on Xanax as needed for anxiety   Protein calorie malnutrition.  She is on numerous supplements albumin level   Actually has normalized at 4.2 which is encouraging  Mild anemia.  Most likely chronic disease i-hemoglobin 13.6  has risen         Pulmonary hypertension: The cause here is not clear. She is not hypoxic and did not have overt PE. Has clear Cor pulmonale vis echo but not clinical heart failure  Has "aged out" of most/all primary prevention.  [id number]

## 2016-01-27 ENCOUNTER — Non-Acute Institutional Stay (SKILLED_NURSING_FACILITY): Payer: Medicare Other | Admitting: Internal Medicine

## 2016-01-27 DIAGNOSIS — J189 Pneumonia, unspecified organism: Secondary | ICD-10-CM | POA: Diagnosis not present

## 2016-01-27 DIAGNOSIS — R05 Cough: Secondary | ICD-10-CM | POA: Diagnosis not present

## 2016-01-27 DIAGNOSIS — R059 Cough, unspecified: Secondary | ICD-10-CM

## 2016-01-27 DIAGNOSIS — I5030 Unspecified diastolic (congestive) heart failure: Secondary | ICD-10-CM

## 2016-01-27 NOTE — Progress Notes (Signed)
Patient ID: [name], female   DOB: 1926-03-05, 82 y.o.   MRN: [id number]                        This is an acute  visit        FACILITY: [facility]:   SNF   CHIEF COMPLAINT: . Acute visit secondary to cough-with history COPD-check saturation on possible pneumonia            History of present illness    Patient is a pleasant elderly resident who recently developed a cough-several weeks ago she also developed a cough and a chest x-ray was concerning for right upper lobe pneumonia superimposed on COPD-she did responded to a course of Augmentin and Mucinex.  She will skin has developed of fairly nonproductive cough she is not complaining of any shortness of breath she is a poor historian secondary to dementia.  We did order an x-ray which showed atelectasis versus infiltrate.  She has been afebrile but is a vulnerable individual here with her history COPD -- general frailty     PAST MEDICAL HISTORY/PROBLEM LIST:           Fracture of the superior pubic ramus.  Osteoperosis   UTI at the admission in July with Morganella.    History of hyponatremia.    History of C.difficile colitis.    Chronic dementia.    Marginal oral intake, apparently 25%, although she may have been doing some better recently and her albumin most recently was 3.6.    Glaucoma.     CURRENT MEDICATIONS:  Medication list is reviewed.              Flora-Q 1 daily Monday, Wednesday, and Friday.    Ensure twice a day.    Mighty shake daily.    Pro-stat  Colace 100 b.i.d.      ASA 81 q.d.       Lasix 20 q.d.      Osteo Bi-Flex 250 once a day.    KCl 20 q.d.       Xalatan ophthalmic 1 drop both eyes at bedtime.    Remeron 7.5 p.o. Q.h.s.  Xanax 0.25 mg every 4 hours when necessary.    Tylenol 500 mg twice a day.    Tramadol 50 mg daily.      SOCIAL HISTORY:                   HOUSING:  The patient was  living next to her daughter in [place] in a trailer.  After the admission in July, she has remained in the facility.    FUNCTIONAL STATUS:  She required some assistance with ADLs by her family; otherwise, was functional during the day.    REVIEW OF SYSTEMS: This is very limited secondary to dementia provided by patient and staff.  In general no fever or chills     HEENT:   No headache. No visual changes or sore throat    CHEST/RESPIRATORY:  No shortness of breath  but Has a  cough CARDIAC:  No chest pain mild lower extremity edema I would say trace    GI:   No reports of abdominal pain nausea vomiting    GU:  No clear dysuria.    MUSCULOSKELETAL:  History of pelvic  pain which apparently has resoved SKIN:  The pressure ulcer, as noted which apparently has resolved Neurologic does not complain of dizziness headache syncopal-type feelings.  Psych again does have significant dementia and suspect some element of depression she is on Remeron--and actually appears to be doing quite well now.     PHYSICAL EXAMINATION:   \She is afebrile pulse 66 respirations 15 blood pressure 117/55 O2 saturations have been in the 90s on room air  GENERAL APPEARANCE:  The patient is not in any distress   Her skin is warm and dry.          HEENT:  She has prescription lenses visual acuity appears grossly intact-.  Oropharynx clear mucous membranes moist   Her own teeth mostly.   No oral lesions are seen.     Chest  there is no labored breathing--however she does have some coarse breath sounds and rhonchi on expiration fairly diffuse CARDIOVASCULAR:   CARDIAC:  There is a 2/6 pansystolic murmur.  JVP is not elevated.   regular rate and rhythm with occasional irregular beats.--She has minimal lower extremity edema bilaterally     .      GASTROINTESTINAL:      HERNIA:  She has a ventral hernia below the umbilicus, which is quite large--abdomen is nontender there are positive bowel sounds.    .        NEUROLOGICAL:   Grossly intact no overt lateralizing findings her speech is clear.   SENSATION/STRENGTH:  Strength in her lower extremities is mildly reduced. Psych she is oriented to self does follow verbal commands is able to carry on conversation somewhat but has significant confusion-this is baseline with previous exams she is pleasant  Labs.   01/06/2016.  WBC 7.7 hemoglobin 13.6 platelets 213.  Sodium 137 potassium 4.7 BUN 24 creatinine 0.48.  Albumin 4.2  ALT 12.  Otherwise liver function tests within normal limits  09/22/2015.  Albumin 3.1.  09/17/2015  Sodium 134 potassium 4.3 BUN 23 creatinine 0.48 CO2 level 32  BNP 300.  WBC 8.3 hemoglobin 11.3 platelets 418.    08/10/2015.  Sodium 136 potassium 4.2 BUN 19 creatinine 0.5 one CO2 level XXXI.  Vitamin D level 51.8 07/16/2015.  WBC 5.0 hemoglobin 11.8 platelets 284.  Albumin 3.6-liver function tests within normal limits except ALT of 10   D .     ASSESSMENT/PLAN:   History of cough chest congestion x-ray showing possible pneumonia-patient is a fragile individual will treat aggressively she did respond well to Augmentin last time will restart this 500 mg twice a day for 7 days as well as a probiotic twice a day for 7 days.  We'll make duo Neb nebulizers routine every 6 hours for 5 days and then when necessary.  Also will add Mucinex 600 mg twice a day for 7 days.  Monitor vital signs pulse ox every shift for 72 hours                          Pansystolic murmur.  She had an echocardiogram done in July.  This showed normal left ventricular function.  She did, however, have a dilated right ventricle with normal systolic function, moderate to severe tricuspid regurge, and severe pulmonary hypertension.   Grade 1 diastolic dysfunction-.  Edema actually appearsminimal--her weight appears stable -- She is on low-dose Lasix with  potassium supplementation   Chest x-ray did not appear to show pulmonary edema or vascular congestion.  [id number]

## 2016-01-29 ENCOUNTER — Encounter: Payer: Self-pay | Admitting: Internal Medicine

## 2016-02-07 ENCOUNTER — Non-Acute Institutional Stay (SKILLED_NURSING_FACILITY): Payer: Medicare Other | Admitting: Internal Medicine

## 2016-02-07 ENCOUNTER — Encounter: Payer: Self-pay | Admitting: Internal Medicine

## 2016-02-07 DIAGNOSIS — R05 Cough: Secondary | ICD-10-CM | POA: Diagnosis not present

## 2016-02-07 DIAGNOSIS — R634 Abnormal weight loss: Secondary | ICD-10-CM | POA: Diagnosis not present

## 2016-02-07 DIAGNOSIS — R059 Cough, unspecified: Secondary | ICD-10-CM | POA: Insufficient documentation

## 2016-02-07 NOTE — Progress Notes (Signed)
Location:  [facility] Room Number: 115/w Place of Service:  SNF (31) Provider:  Mora Bellman, MD  Patient Care Team: Marjean Donna, MD as PCP - General (Family Medicine)  Extended Emergency Contact Information Primary Emergency Contact: Smith,Wendy Address: [place], [place] Phone: [phone] Work Phone: [phone] Relation: Daughter   Goals of care: Advanced Directive information Advanced Directives 02/07/2016  Does patient have an advance directive? Yes  Type of Paramedic of [place];Living will;Out of facility DNR (pink MOST or yellow form)  Does patient want to make changes to advanced directive? No - Patient declined  Copy of advanced directive(s) in chart? Yes  Pre-existing out of facility DNR order (yellow form or pink MOST form) -    Chief complaint-acute visit follow-up cough question chest congestion   HPI:  Pt is a 82 y.o. female seen today for an acute visit for continued cough and possible chest congestion.  She does have a history of right lung pneumonia this was treated recently with Augmentin duo nebs Mucinex.  Late last month she again developed some cough and congestion x-ray showed possible minimal infiltrate versus atelectasis-with her history she was treated with a antibiotic Augmentin  again for 7 days as well as Mucinex and duo nebs.  Per nursing the cough has improved and patient active denying any cough today however she still has some continued cough and apparently at times some chest congestion.  It appears that she is no longer receiving  when necessary duo nebs these will have to be restarted.  She is not complaining of any shortness of breath and again is denying having any increased cough although nursing has noted the cough although somewhat improved still is persisting to some extent.  Also noted her O2 sats rations drop  into the high 80s but apparently this is while she was sleeping when she is awake 8 O2 stats are in the 90s we did take them today and it was 93% on room air.  She denies any chest pain any shortness of breath thinks her cough is essentially gone although nursing staff again think she still has some residual coughing---   Past Medical History  Diagnosis Date  . Clostridium difficile colitis     dating back to 2004  . Glaucoma   . Diverticulosis   . Diverticulitis   . Recurrent UTI   . Chronic constipation   . Nausea   . Dementia   . Hypertension   . Murmur, cardiac   . Gait instability     Uses Turnipseed or cane toambulate   Past Surgical History  Procedure Laterality Date  . Orif right hip  2004    Dr.Harrison  . Bilateral eyelid surgery    . Colonoscopy  10/30/04    NUR  . Vulvectomy N/A 06/14/2014    Procedure: PARTIAL MODIFED ANTERIOR RADICAL VULVECTOMY ;  Surgeon: Everitt Amber, MD;  Location: [facility];  Service: Gynecology;  Laterality: N/A;    Allergies  Allergen Reactions  . Flagyl [Metronidazole Hcl]   . Other     Most antibiotics causes severe c. difficille.  . Ciprofloxacin Rash    Medications.  Aspirin 81 mg daily.  Lasix 20 mg daily.  Milk of magnesia daily when necessary  Multivitamin daily.  Potassium 20 mEq daily.  Radial 080.52 grams daily when necessary.  Remeron 7.5 mg  daily at bedtime.  Tylenol 500 twice a day.  Tramadol 50 mg every 4 hours when necessary as well as once a day at 1 PM for back pain.  Latanoprost drops 0.005% 1 drop both eyes daily at bedtime.  Xanax 0.25 mg every 4 hours when necessary ProStep twice a day 30 mL.  Robitussin-DM every 6 hours when necessary cough congestion when necessary    Review of Systems   Limited secondary to dementia provided by patient and nursing.  In general no plates fever or chills.  Head ears eyes nose mouth and throat-no headache no visual changes or sore throat.  Respiratory  apparently occasional congestion noted no shortness of breath has occasional cough.  Cardiac no chest pain minimal lower extremity edema.  GI does not complain of abdominal pain nausea or vomiting diarrhea.  GU does not complain of dysuria.  Muscle skeletal is not really complaining of joint pain today.  Psych does have significant dementia but appears to be doing well with supportive care   There is no immunization history on file for this patient. Pertinent  Health Maintenance Due  Topic Date Due  . PNA vac Low Risk Adult (1 of 2 - PCV13) 10/01/2016 (Originally 07/29/1993)  . DEXA SCAN  02/06/2017 (Originally 07/29/1993)  . INFLUENZA VACCINE  05/01/2016   No flowsheet data found. Functional Status Survey:    Filed Vitals:   02/07/16 1109  BP: 128/65  Pulse: 67  Temp: 98.1 F (36.7 C)  TempSrc: Oral  Resp: 22  Height: 5\' 7"  (1.702 m)  Weight: 95 lb 12.8 oz (43.455 kg)   Body mass index is 15 kg/(m^2). Physical Exam   In general this is a pleasant elderly female in no distress sitting comfortable be in a wheelchair.  Skin is warm and dry.  Eyes she has prescription lenses visual acuity appears grossly intact.  Oropharynx clear mucous membranes moist I do not see any drainage.  Chest no labored breathing there is minimal bronchial sounds on expiration otherwise no congestion noted   Heart there is a 2/6 systolic murmur-regular rate and rhythm with occasional irregular beats she has quite minimal lower extremity edema.  Abdomen soft nontender positive bowel sounds.  She does have a ventral hernia.  Neurologic is grossly intact her speech is clear no lateralizing findings.  Psych she is oriented to self is pleasantly confused quite talkative which is her baseline   Labs reviewed:  Recent Labs  09/17/15 0620 12/06/15 0720 01/06/16 0915  NA 134* 135 137  K 4.3 4.4 4.7  CL 94* 96* 94*  CO2 32 31 34*  GLUCOSE 103* 109* 106*  BUN 23* 30* 24*  CREATININE  0.48 0.60 0.48  CALCIUM 8.8* 8.7* 9.2    Recent Labs  07/16/15 1028 09/22/15 0715 12/06/15 0720 01/06/16 0915  AST 18  --  17 17  ALT 10*  --  11* 12*  ALKPHOS 75  --  64 99  BILITOT 0.3  --  0.7 0.4  PROT 6.3*  --  6.5 7.3  ALBUMIN 3.6 3.1* 3.4* 4.2    Recent Labs  12/06/15 0720 12/08/15 0710 01/06/16 0915  WBC 13.4* 5.9 7.7  NEUTROABS 11.7* 4.6 6.7  HGB 12.0 12.0 13.6  HCT 37.1 37.3 42.6  MCV 86.3 86.3 88.6  PLT 186 199 213   Lab Results  Component Value Date   TSH 1.212 02/16/2013   No results found for: HGBA1C No results found for: CHOL, HDL, LDLCALC, LDLDIRECT, TRIG,  CHOLHDL  Significant Diagnostic Results in last 30 days:  No results found.  Assessment/Plan #1 cough with congestion-at this point appears to be improved although apparently she still has some cough at times that concerns nursing-I note she is no longer on duo nebs will restart these when necessary as well as a short course of Mucinex 60 mg twice a day for 5 days apparently she improved with the Mucinex and nebulizers-again clinically she appears to be doing well here with again continued cough which is somewhat persistent will monitor for any changes.  In regards to her O2 saturation in the high 90s apparently 88% while she is sleeping this is not totally abnormal 11 is sleeping-O2 stat continues to be in the 90s when she is awake and ambulating at this point will monitor she does not complain of any shortness of breath.  I also note she is lost about 4 pounds in the last month she is on supplements including Pro-stat twice a day and snack encouragement will update an albumin level to see where we stand her albumin was 4.2 actually a month ago--She continues on Remeron for appetite stimulation She does drink well fact she was drinking ensure when I was in the room apparently nursing staff encourages the Ensure whenever she takes her meds  [id number]-of note greater than 25 minutes spent assessing  patient-reviewing her chart-reviewing her labs-discussing her status with nursing staff-and coordinating formulating plan a-of note greater than 50% of time spent coordinating plan a with chart review and discussion with nursing        Oralia Manis, [place]

## 2016-02-08 ENCOUNTER — Other Ambulatory Visit (HOSPITAL_COMMUNITY)
Admission: AD | Admit: 2016-02-08 | Discharge: 2016-02-08 | Disposition: A | Discharge status: Home / Self Care | Payer: Medicare Other | Source: Skilled Nursing Facility | Attending: Internal Medicine | Admitting: Internal Medicine

## 2016-02-08 DIAGNOSIS — X58XXXD Exposure to other specified factors, subsequent encounter: Secondary | ICD-10-CM | POA: Diagnosis not present

## 2016-02-08 DIAGNOSIS — L89150 Pressure ulcer of sacral region, unstageable: Secondary | ICD-10-CM | POA: Insufficient documentation

## 2016-02-08 DIAGNOSIS — S32592D Other specified fracture of left pubis, subsequent encounter for fracture with routine healing: Secondary | ICD-10-CM | POA: Insufficient documentation

## 2016-02-08 DIAGNOSIS — M1990 Unspecified osteoarthritis, unspecified site: Secondary | ICD-10-CM | POA: Insufficient documentation

## 2016-02-08 DIAGNOSIS — F5089 Other specified eating disorder: Secondary | ICD-10-CM | POA: Insufficient documentation

## 2016-02-08 LAB — COMPREHENSIVE METABOLIC PANEL
ALT: 10 U/L — ABNORMAL LOW (ref 14–54)
ANION GAP: 7 (ref 5–15)
AST: 17 U/L (ref 15–41)
Albumin: 3.6 g/dL (ref 3.5–5.0)
Alkaline Phosphatase: 68 U/L (ref 38–126)
BILIRUBIN TOTAL: 0.5 mg/dL (ref 0.3–1.2)
BUN: 26 mg/dL — AB (ref 6–20)
CO2: 31 mmol/L (ref 22–32)
Calcium: 8.6 mg/dL — ABNORMAL LOW (ref 8.9–10.3)
Chloride: 98 mmol/L — ABNORMAL LOW (ref 101–111)
Creatinine, Ser: 0.49 mg/dL (ref 0.44–1.00)
GFR calc Af Amer: >60 mL/min (ref >60)
Glucose, Bld: 96 mg/dL (ref 65–99)
Potassium: 4.7 mmol/L (ref 3.5–5.1)
Sodium: 136 mmol/L (ref 135–145)
TOTAL PROTEIN: 6.2 g/dL — AB (ref 6.5–8.1)

## 2016-02-23 ENCOUNTER — Ambulatory Visit (INDEPENDENT_AMBULATORY_CARE_PROVIDER_SITE_OTHER): Payer: Medicare Other | Admitting: Internal Medicine

## 2016-02-29 ENCOUNTER — Non-Acute Institutional Stay (SKILLED_NURSING_FACILITY): Payer: Medicare Other | Admitting: Internal Medicine

## 2016-02-29 ENCOUNTER — Encounter: Payer: Self-pay | Admitting: Internal Medicine

## 2016-02-29 DIAGNOSIS — I5030 Unspecified diastolic (congestive) heart failure: Secondary | ICD-10-CM

## 2016-02-29 DIAGNOSIS — I4891 Unspecified atrial fibrillation: Secondary | ICD-10-CM | POA: Insufficient documentation

## 2016-02-29 DIAGNOSIS — I499 Cardiac arrhythmia, unspecified: Secondary | ICD-10-CM

## 2016-02-29 NOTE — Progress Notes (Signed)
Location:  [facility] Room Number: 115/W Place of Service:  SNF (31) Provider:  Mora Bellman, MD  Patient Care Team: Marjean Donna, MD as PCP - General (Family Medicine)  Extended Emergency Contact Information Primary Emergency Contact: Smith,Wendy Address: [place], [place] Phone: [phone] Work Phone: [phone] Relation: Daughter  Code Status:  DNR Goals of care: Advanced Directive information Advanced Directives 02/29/2016  Does patient have an advance directive? Yes  Type of Advance Directive Out of facility DNR (pink MOST or yellow form)  Does patient want to make changes to advanced directive? No - Patient declined  Copy of advanced directive(s) in chart? Yes     Chief Complaint  Patient presents with  . Acute Visit    For Heart  flutter    HPI:  Pt is a 82 y.o. female seen today for an acute visit for Complaints of feeling like her heart is fluttering.  Apparently this is more when she is walking with her Luka working with restorative. Vital signs have been stable.  She does have a history of a cardiac murmur which is not new.  Per review of Epic it appears she is complaining of heart fluttering here several years ago as well this was seen by cardiology EKG did not show anything acutely concerning and apparently she also had a Holter monitor done although I do not see those results.  However I do not see any further cardiology notes which would lead me to believe that Holter monitor did not really show anything concerning.  She did have a cardiac echo done in July 2016 which showed a normal ejection fraction 65-70% with grade 1 diastolic dysfunction-it dilated right ventricle with moderate to severe tricuspid regurgitation and pulmonary hypertension.  Currently she is sitting in her wheelchair comfortably vital signs remained stable her rate is regular with  frequent irregular beats although underlying rhythm appears to be regular in the 80s she is not complaining of any chest pain or her heart fluttering.  In fact she denies feeling like her heart is fluttering at times-although she is a poor historian secondary to dementia.  We we have done EKGs with her resting and also after walking a show sinus rhythm with suspicion for some premature complexes suspected atrial-with a left anterior fascicular block.  A pulmonary disease pattern as well.  EKG after walking and at rest appeared to be essentially similar pulse rate in both is in the 80s     Past Medical History  Diagnosis Date  . Clostridium difficile colitis     dating back to 2004  . Glaucoma   . Diverticulosis   . Diverticulitis   . Recurrent UTI   . Chronic constipation   . Nausea   . Dementia   . Hypertension   . Murmur, cardiac   . Gait instability     Uses Ragen or cane toambulate   Past Surgical History  Procedure Laterality Date  . Orif right hip  2004    Dr.Harrison  . Bilateral eyelid surgery    . Colonoscopy  10/30/04    NUR  . Vulvectomy N/A 06/14/2014    Procedure: PARTIAL MODIFED ANTERIOR RADICAL VULVECTOMY ;  Surgeon: Everitt Amber, MD;  Location: [facility];  Service: Gynecology;  Laterality: N/A;    Allergies  Allergen Reactions  . Flagyl [Metronidazole Hcl]   . Other  Most antibiotics causes severe c. Difficille.  Nuts and any food containing seeds  . Ciprofloxacin Rash    Current Outpatient Prescriptions on File Prior to Visit  Medication Sig Dispense Refill  . acetaminophen (TYLENOL) 500 MG tablet Take one tablet by mouth twice daily    . ALPRAZolam (XANAX) 0.25 MG tablet Take one tablet by mouth every 4 hours as needed for nerves    . aspirin 81 MG tablet Take 81 mg by mouth daily.    Marland Kitchen docusate sodium (COLACE) 100 MG capsule Take 100 mg by mouth 2 (two) times daily.    Marland Kitchen ENSURE (ENSURE) Take by mouth twice daily between meals    . furosemide  (LASIX) 20 MG tablet Take one tablet by mouth once daily    . latanoprost (XALATAN) 0.005 % ophthalmic solution Place 1 drop into both eyes at bedtime.     . mirtazapine (REMERON) 7.5 MG tablet Take 7.5 mg by mouth at bedtime.    . Multiple Vitamins-Minerals (MULTIVITAMIN WITH MINERALS) tablet Take 1 tablet by mouth daily.      . NON FORMULARY Mighty Shake Daily    . petrolatum-hydrophilic-aloe vera (ALOE VESTA) ointment Apply to sacrum and bilateral buttocks every shift and as needed for prevention    . Pollen Extracts (PROSTAT PO) Give 45ml by mouth twice daily to promote wound healing. Mix with Ensure Clear +vanilla Ice cream twice daily between meals    . potassium chloride SA (K-DUR,KLOR-CON) 20 MEQ tablet Take 20 mEq by mouth daily.     . traMADol (ULTRAM) 50 MG tablet Take one tablet by mouth every 4 hours as needed for pain; Take one tablet by mouth once daily for back pain    . Vitamins A & D (VITAMIN A & D) ointment Apply A&D ointment to bilateral heels q shift & PRN for prevention ( EVERY SHIFT )     No current facility-administered medications on file prior to visit.     Review of Systems  Again this is limited secondary to dementia.  In general is not complaining of any fever or chills.  Respiratory does not complain of shortness breath or cough currently she has been treated at times respiratory issues.  Cardiac does not complaining of any chest pain  Told  nursing earlier she felt like her heart was fluttering but no chest pain.  GI does not complaining any nausea vomiting diarrhea constipation or abdominal discomfort.  Muscle skeletal is not complaining of joint pain currently.  Neurologic is not complaining of any dizziness syncopal episodes does not complain of that apparently when walking either.  Psych does have significant dementia     There is no immunization history on file for this patient. Pertinent  Health Maintenance Due  Topic Date Due  . PNA vac Low  Risk Adult (1 of 2 - PCV13) 10/01/2016 (Originally 07/29/1993)  . DEXA SCAN  02/06/2017 (Originally 07/29/1993)  . INFLUENZA VACCINE  05/01/2016   No flowsheet data found. Functional Status Survey:  Vital signs--  Manual blood pressure is 120/64-pulse is 80-respirations 16-she is afebrile Body mass index is 15.06 kg/(m^2). Physical Exam   In general this is a pleasant elderly female in no distress sitting comfortably in her wheelchair.  Her skin is warm and dry.  Eyes she has prescription lenses visual acuity appears grossly intact sclera and conjunctiva are clear.  Chest is clear to auscultation with somewhat shallow air entry there is no labored breathing.  Heart is regular rate and  rhythm with occasional irregular beats underlying rate appears to be regular at 84.  Abdomen is soft nontender protuberant with positive bowel sounds.  Muscle skeletal does move all extremities 4 again does ambulate at times with her Lerner.  Neurologic is grossly intact her speech is clear no lateralizing findings.  Psych she is oriented to self only continues to be pleasant and quite talkative.    Labs reviewed:  02/08/2016.  Sodium 136 potassium 4.7 BUN 26 creatinine 0.49.  ALT 10 otherwise liver function tests within normal limits.  Albumin was 3.6.  01/06/2016.  WBC 7.7 hemoglobin 13.6 platelets 213.    Recent Labs  12/06/15 0720 01/06/16 0915 02/08/16 0557  NA 135 137 136  K 4.4 4.7 4.7  CL 96* 94* 98*  CO2 31 34* 31  GLUCOSE 109* 106* 96  BUN 30* 24* 26*  CREATININE 0.60 0.48 0.49  CALCIUM 8.7* 9.2 8.6*    Recent Labs  12/06/15 0720 01/06/16 0915 02/08/16 0557  AST 17 17 17   ALT 11* 12* 10*  ALKPHOS 64 99 68  BILITOT 0.7 0.4 0.5  PROT 6.5 7.3 6.2*  ALBUMIN 3.4* 4.2 3.6    Recent Labs  12/06/15 0720 12/08/15 0710 01/06/16 0915  WBC 13.4* 5.9 7.7  NEUTROABS 11.7* 4.6 6.7  HGB 12.0 12.0 13.6  HCT 37.1 37.3 42.6  MCV 86.3 86.3 88.6  PLT 186 199  213   Lab Results  Component Value Date   TSH 1.212 02/16/2013   No results found for: HGBA1C No results found for: CHOL, HDL, LDLCALC, LDLDIRECT, TRIG, CHOLHDL  Significant Diagnostic Results in last 30 days:  No results found.  Assessment/Plan  #1 complaints of heart fluttering-arrhythmia?-EKG did not show acute changes and did show occasional premature complexes but underlying sinus rhythm auscultation appears to support this-it looks like she has complained of this before with apparently a fairly benign conclusion although again somewhat unclear. She appears to be at her baseline no signs of distress certainly-consider Holter monitoring for more evaluation-will discuss this with Dr. Linna Darner.  Also will update labs including electrolytes and hemoglobin as well as a TSH.  History of diastolic CHF-again this appears to be compensated currently again will update labs as noted above she is on Lasix with potassium supplementation  [id number]-of note greater than 40 minutes spent assessing patient-discussing her status with nursing staff-reviewing her chart-reviewing her labs-in coordinating and formulating a plan of care-of note greater than 50% of time coordinating plan a involving extensive chart review and obtaining EKGs        Oralia Manis, [place]

## 2016-03-01 ENCOUNTER — Encounter: Payer: Self-pay | Admitting: Internal Medicine

## 2016-03-01 ENCOUNTER — Non-Acute Institutional Stay (SKILLED_NURSING_FACILITY): Payer: Medicare Other | Admitting: Internal Medicine

## 2016-03-01 ENCOUNTER — Encounter (HOSPITAL_COMMUNITY)
Admission: RE | Admit: 2016-03-01 | Discharge: 2016-03-01 | Disposition: A | Discharge status: Home / Self Care | Payer: Medicare Other | Source: Skilled Nursing Facility | Attending: Internal Medicine | Admitting: Internal Medicine

## 2016-03-01 DIAGNOSIS — F5089 Other specified eating disorder: Secondary | ICD-10-CM | POA: Diagnosis not present

## 2016-03-01 DIAGNOSIS — S32592D Other specified fracture of left pubis, subsequent encounter for fracture with routine healing: Secondary | ICD-10-CM | POA: Insufficient documentation

## 2016-03-01 DIAGNOSIS — X58XXXD Exposure to other specified factors, subsequent encounter: Secondary | ICD-10-CM | POA: Insufficient documentation

## 2016-03-01 DIAGNOSIS — R002 Palpitations: Secondary | ICD-10-CM | POA: Diagnosis not present

## 2016-03-01 DIAGNOSIS — F0390 Unspecified dementia without behavioral disturbance: Secondary | ICD-10-CM

## 2016-03-01 DIAGNOSIS — M1990 Unspecified osteoarthritis, unspecified site: Secondary | ICD-10-CM | POA: Insufficient documentation

## 2016-03-01 DIAGNOSIS — I272 Other secondary pulmonary hypertension: Secondary | ICD-10-CM | POA: Diagnosis not present

## 2016-03-01 LAB — CBC WITH DIFFERENTIAL/PLATELET
Basophils Absolute: 0 10*3/uL (ref 0.0–0.1)
Basophils Relative: 0 %
Eosinophils Absolute: 0.1 10*3/uL (ref 0.0–0.7)
Eosinophils Relative: 4 %
HEMATOCRIT: 40.7 % (ref 36.0–46.0)
Hemoglobin: 12.7 g/dL (ref 12.0–15.0)
LYMPHS PCT: 23 %
Lymphs Abs: 0.9 10*3/uL (ref 0.7–4.0)
MCH: 28.1 pg (ref 26.0–34.0)
MCHC: 31.2 g/dL (ref 30.0–36.0)
MCV: 90 fL (ref 78.0–100.0)
MONO ABS: 0.4 10*3/uL (ref 0.1–1.0)
MONOS PCT: 11 %
NEUTROS ABS: 2.5 10*3/uL (ref 1.7–7.7)
Neutrophils Relative %: 62 %
Platelets: 163 10*3/uL (ref 150–400)
RBC: 4.52 MIL/uL (ref 3.87–5.11)
RDW: 16.3 % — AB (ref 11.5–15.5)
WBC: 3.9 10*3/uL — ABNORMAL LOW (ref 4.0–10.5)

## 2016-03-01 LAB — BASIC METABOLIC PANEL
Anion gap: 6 (ref 5–15)
BUN: 29 mg/dL — AB (ref 6–20)
CALCIUM: 9.2 mg/dL (ref 8.9–10.3)
CO2: 34 mmol/L — ABNORMAL HIGH (ref 22–32)
CREATININE: 0.53 mg/dL (ref 0.44–1.00)
Chloride: 95 mmol/L — ABNORMAL LOW (ref 101–111)
GFR calc Af Amer: >60 mL/min (ref >60)
GLUCOSE: 95 mg/dL (ref 65–99)
POTASSIUM: 5.6 mmol/L — AB (ref 3.5–5.1)
Sodium: 135 mmol/L (ref 135–145)

## 2016-03-01 LAB — TSH: TSH: 1.437 u[IU]/mL (ref 0.350–4.500)

## 2016-03-01 NOTE — Patient Instructions (Signed)
Supplemental oxygen to maintain O2 sats in the 90% to prevent exacerbation of her documented pulmonary hypertension.

## 2016-03-01 NOTE — Progress Notes (Signed)
Subjective:     Patient ID: [name], female   DOB: Apr 17, 1926, 82 y.o.   MRN: [id number]    HPI    The patient was seen 02/29/16 for heart fluttering. She has seen Dr. Domenic Polite, cardiology in the past for arrhythmias. She has documented grade 1 diastolic dysfunction;mild mitral regurgitation, mildly dilated left atrium, moderately dilated right atrium, mildly dilated right ventricle ; and moderate to severe tricuspid regurgitation. Additionally she has severe pulmonary hypertension with pressures of 83 mm  The patient has dementia and her complaints were short lived.  EKG 02/28/18 revealed loss of T voltage in the inferior leads and T-wave inversion in lead 3 compared to EKG 04/02/15. She also exhibited poor R-wave progression which was found previously .The T changes were nonspecific but ischemia or strain needed to be considered. This prompted a follow-up assessment 6/1.  Labs revealed mild prerenal azotemia and minimal hyperkalemia. TSH was therapeutic at 1.437. Chest x-rays have shown severe emphysema /COPD. There may be a component of "pseudo-emphysema" as well because of her small frame.   Review of Systems Denied are chest pain, palpitations, claudication, edema, paroxysmal nocturnal dyspnea, exertional dyspnea, significant cough, or hemoptysis. In fact, despite the extensive cardiac workup in the past for similar complaints, she denies ever having any heart fluttering at today's visit.      Objective:   Physical Exam Pertinent or positive findings include: Bilateral ptosis present. She exhibits wasting of the facial and neck musculature.Marland Kitchen Heart rate is irregular. Both first and second heart sounds accentuated. She has juicy rales at the bases. Breath sounds are decreased and more distant in the upper lobes. She has wasting of her extremities. Posterior tibial pulses are decreased Homans sign is negative. She gave the year as 2020. She was unable to name the  president.  General appearance :Thin but adequately nourished; in no distress. Eyes: No conjunctival inflammation or scleral icterus is present. Oral exam:  Lips and gums are healthy appearing.There is no oropharyngeal erythema or exudate noted. Dental hygiene is good. Heart:  No gallop, murmur, click, rub    Abdomen: bowel sounds normal, soft and non-tender without masses, organomegaly or hernias noted.  No guarding or rebound.  Vascular : all pulses equal ; no bruits present. Skin:Warm & dry.  Intact without suspicious lesions or rashes ; no tenting or jaundice  Lymphatic: No lymphadenopathy is noted about the head, neck, axilla Neuro: Strength decreased ; DTRs normal.       Assessment:     #1 fluttering or palpitations; eyes any symptoms at this time. As noted she actually denies ever having had fluttering. #2 pulmonary hypertension. Her age and dementia mitigate against the aggressive treatment of the pulmonary hypertension. #3 COPD, mainly emphysema    Plan:      There are no good choices here. Supplemental oxygen is indicated to prevent desaturation with exacerbation of the pulmonary hypertension. If she complains of fluttering persistently; a Holter monitor would be indicated. If she has chest pain cardiac enzymes will be ordered.  Again the limiting factor here is her dementia as demonstrated by her denial of any cardiac symptoms at this time despite the well-documented workups to date.

## 2016-03-01 NOTE — Progress Notes (Deleted)
Patient ID: [name], female   DOB: 1926/08/07, 82 y.o.   MRN: [id number]    [facility] Room:115  Chief Complaint  Patient presents with  . Acute Visit    EKG Changes    Allergies  Allergen Reactions  . Flagyl [Metronidazole Hcl]   . Other     Most antibiotics causes severe c. Difficille.  Nuts and any food containing seeds  . Ciprofloxacin Rash    This is a nursing facility follow up for specific acute issue or progression of chronic medical diagnosis ( or [place] readmission within 30 days)  Interim medical record and care since last [facility] visit was updated with review of diagnostic studies and change in clinical status since last visit were documented.  Note: only family and social history pertinent to this assessment are included.  HPI:  Comprehensive review of systems:Constitutional: No fever,significant weight change, fatigue  Eyes: No redness, discharge, pain, vision change ENT/mouth: No nasal congestion,  purulent discharge, earache,change in hearing ,sore throat  Cardiovascular: No chest pain, palpitations,paroxysmal nocturnal dyspnea, claudication, edema  Respiratory: No cough, sputum production,hemoptysis, DOE , significant snoring,apnea  Gastrointestinal: No heartburn,dysphagia,abdominal pain, nausea / vomiting,rectal bleeding, melena,change in bowels Genitourinary: No dysuria,hematuria, pyuria,  incontinence, nocturia Musculoskeletal: No joint stiffness, joint swelling, weakness,pain Dermatologic: No rash, pruritus, change in appearance of skin Neurologic: No dizziness,headache,syncope, seizures, numbness , tingling Psychiatric: No significant anxiety , depression, insomnia, anorexia Endocrine: No change in hair/skin/ nails, excessive thirst, excessive hunger, excessive urination  Hematologic/lymphatic: No significant bruising, lymphadenopathy,abnormal bleeding Allergy/immunology: No itchy/ watery eyes, significant sneezing,  urticaria, angioedema  Physical exam:  Pertinent or positive findings: General appearance:Adequately nourished; no acute distress , increased work of breathing is present.   Lymphatic: No lymphadenopathy about the head, neck, axilla . Eyes: No conjunctival inflammation or lid edema is present. There is no scleral icterus. Ears:  External ear exam shows no significant lesions or deformities.   Nose:  External nasal examination shows no deformity or inflammation. Nasal mucosa are pink and moist without lesions ,exudates Oral exam: lips and gums are healthy appearing.There is no oropharyngeal erythema or exudate . Neck:  No thyromegaly, masses, tenderness noted.    Heart:  Normal rate and regular rhythm. S1 and S2 normal without gallop, murmur, click, rub .  Lungs:Chest clear to auscultation without wheezes, rhonchi,rales , rubs. Abdomen:Bowel sounds are normal. Abdomen is soft and nontender with no organomegaly, hernias,masses. GU: deferred as previously addressed. Extremities:  No cyanosis, clubbing,edema  Neurologic exam : Cn 2-7 intact Strength equal  in upper & lower extremities Balance,Rhomberg,finger to nose testing could not be completed due to clinical state Deep tendon reflexes are equal Skin: Warm & dry w/o tenting. No significant lesions or rash.    See summary under each active problem in the Problem List with associated updated therapeutic plan

## 2016-03-05 ENCOUNTER — Other Ambulatory Visit (HOSPITAL_COMMUNITY)
Admission: AD | Admit: 2016-03-05 | Discharge: 2016-03-05 | Disposition: A | Discharge status: Home / Self Care | Payer: Medicare Other | Source: Skilled Nursing Facility | Attending: Internal Medicine | Admitting: Internal Medicine

## 2016-03-05 DIAGNOSIS — Z029 Encounter for administrative examinations, unspecified: Secondary | ICD-10-CM | POA: Insufficient documentation

## 2016-03-05 LAB — BASIC METABOLIC PANEL
Anion gap: 9 (ref 5–15)
BUN: 27 mg/dL — AB (ref 6–20)
CHLORIDE: 93 mmol/L — AB (ref 101–111)
CO2: 31 mmol/L (ref 22–32)
CREATININE: 0.47 mg/dL (ref 0.44–1.00)
Calcium: 8.9 mg/dL (ref 8.9–10.3)
GFR calc Af Amer: >60 mL/min (ref >60)
GFR calc non Af Amer: >60 mL/min (ref >60)
GLUCOSE: 108 mg/dL — AB (ref 65–99)
POTASSIUM: 4.1 mmol/L (ref 3.5–5.1)
SODIUM: 133 mmol/L — AB (ref 135–145)

## 2016-03-16 ENCOUNTER — Encounter: Payer: Self-pay | Admitting: Internal Medicine

## 2016-03-16 ENCOUNTER — Non-Acute Institutional Stay (SKILLED_NURSING_FACILITY): Payer: Medicare Other | Admitting: Internal Medicine

## 2016-03-16 DIAGNOSIS — R609 Edema, unspecified: Secondary | ICD-10-CM | POA: Diagnosis not present

## 2016-03-16 DIAGNOSIS — Z8679 Personal history of other diseases of the circulatory system: Secondary | ICD-10-CM | POA: Diagnosis not present

## 2016-03-16 DIAGNOSIS — Z87898 Personal history of other specified conditions: Secondary | ICD-10-CM

## 2016-03-16 DIAGNOSIS — I5030 Unspecified diastolic (congestive) heart failure: Secondary | ICD-10-CM | POA: Diagnosis not present

## 2016-03-16 NOTE — Progress Notes (Signed)
Location:  [facility] Room Number: 115/W Place of Service:  SNF [phone]) Provider:  Leeanne Deed, MD  Patient Care Team: Hendricks Limes, MD as PCP - General (Internal Medicine)  Extended Emergency Contact Information Primary Emergency Contact: Smith,Wendy Address: [address], [place] Phone: [phone] Work Phone: [phone] Relation: Daughter  Code Status:  DNR Goals of care: Advanced Directive information Advanced Directives 03/16/2016  Does patient have an advance directive? Yes  Type of Paramedic of [place];Living will;Out of facility DNR (pink MOST or yellow form)  Does patient want to make changes to advanced directive? No - Patient declined  Copy of advanced directive(s) in chart? -  Pre-existing out of facility DNR order (yellow form or pink MOST form) -     Chief Complaint  Patient presents with  . Acute Visit    Patients c/o Lt. Hand edema    HPI:  Pt is a 82 y.o. female seen today for an acute visit for Left hand edema apparently this has occurred over the last day or 2-I was called about this yesterday and we did order an x-ray which is not show anything acute other than degenerative changes.  A venous Doppler also has been ordered however from what I see the day I think the likelihood of a clot is fairly remote.  She is not having any pain this edema is basically i in her left hand it is cool to touch nonerythematous nontender appears to be more of a fluid situation.  She does have a history of some edema she is on low-dose Lasix but lower extremity edema is quite minimal today this is been stable for some time.  She is not complaining of any shortness of breath or pain here her vital signs continued to be stable   Past Medical History  Diagnosis Date  . Clostridium difficile colitis     dating back to 2004  . Glaucoma   .  Diverticulosis   . Diverticulitis   . Recurrent UTI   . Chronic constipation   . Nausea   . Dementia   . Hypertension   . Murmur, cardiac   . Gait instability     Uses [place] or cane toambulate   Past Surgical History  Procedure Laterality Date  . Orif right hip  2004    Dr.Harrison  . Bilateral eyelid surgery    . Colonoscopy  10/30/04    NUR  . Vulvectomy N/A 06/14/2014    Procedure: PARTIAL MODIFED ANTERIOR RADICAL VULVECTOMY ;  Surgeon: Everitt Amber, MD;  Location: [facility];  Service: Gynecology;  Laterality: N/A;    Allergies  Allergen Reactions  . Flagyl [Metronidazole Hcl]   . Other     Most antibiotics causes severe c. Difficille.  Nuts and any food containing seeds  . Ciprofloxacin Rash     Review of Systems this is quite limited secondary to dementia please see history of present illness Current Outpatient Prescriptions on File Prior to Visit  Medication Sig Dispense Refill  . acetaminophen (TYLENOL) 500 MG tablet Take one tablet by mouth twice daily    . ALPRAZolam (XANAX) 0.25 MG tablet Take one tablet by mouth every 4 hours as needed for nerves    . aspirin 81 MG tablet Take 81 mg by mouth daily.    Marland Kitchen docusate sodium (COLACE) 100 MG capsule Take  100 mg by mouth 2 (two) times daily.    Marland Kitchen ENSURE (ENSURE) Take by mouth twice daily between meals    . furosemide (LASIX) 20 MG tablet Take one tablet by mouth once daily    . ipratropium-albuterol (DUONEB) 0.5-2.5 (3) MG/3ML SOLN Take 3 mLs by nebulization every 6 (six) hours as needed. For cough,chest congestion, wheezing    . latanoprost (XALATAN) 0.005 % ophthalmic solution Place 1 drop into both eyes at bedtime.     . mirtazapine (REMERON) 7.5 MG tablet Take 7.5 mg by mouth at bedtime.    . Multiple Vitamins-Minerals (MULTIVITAMIN WITH MINERALS) tablet Take 1 tablet by mouth daily.      . NON FORMULARY [place] Shake Daily    . OXYGEN May give oxygen at 2 l/min via nasal cannula as needed    .  petrolatum-hydrophilic-aloe vera (ALOE VESTA) ointment Apply to sacrum and bilateral buttocks every shift and as needed for prevention    . Pollen Extracts (PROSTAT PO) Give 17ml by mouth twice daily to promote wound healing. Mix with Ensure Clear +vanilla Ice cream twice daily between meals    . potassium chloride SA (K-DUR,KLOR-CON) 20 MEQ tablet Take 10 mEq by mouth daily.     . traMADol (ULTRAM) 50 MG tablet Take one tablet by mouth every 4 hours as needed for pain; Take one tablet by mouth once daily for back pain    . Vitamins A & D (VITAMIN A & D) ointment Apply A&D ointment to bilateral heels q shift & PRN for prevention ( EVERY SHIFT )     No current facility-administered medications on file prior to visit.     There is no immunization history on file for this patient. Pertinent  Health Maintenance Due  Topic Date Due  . PNA vac Low Risk Adult (1 of 2 - PCV13) 10/01/2016 (Originally 07/29/1993)  . DEXA SCAN  02/06/2017 (Originally 07/29/1993)  . INFLUENZA VACCINE  05/01/2016   No flowsheet data found. Functional Status Survey:    Filed Vitals:   03/16/16 1427  BP: 111/71  Pulse: 88  Temp: 97.4 F (36.3 C)  TempSrc: Oral  Resp: 16  Height: 5\' 7"  (1.702 m)  Weight: 96 lb (43.545 kg)  SpO2: 99%   Body mass index is 15.03 kg/(m^2). Physical Exam   In general this is a frail elderly female in no distress sitting comfortably in her wheelchair.  Her skin is warm and dry. Calf eyes pupils appear reactive light visual acuity appears intact sclera and conjunctiva clear she does have bilateral proptosis.  Oropharynx clear mucous membranes moist.  Chest is clear to auscultation with reduced breath sounds there is no labored breathing.  Heart is regular rate and rhythm with [id number] systolic murmur-she does not really have significant lower extremity edema.  Abdomen is protuberant soft nontender with positive bowel sounds.  Muscle skeletal has general frailty with muscle  wasting is able to move all extremities 4-grip strength appears preserved left hand the edema is flesh-colored soft nontender non-erythematous this is basically on the dorsal aspect of her hand extending to the wrist radial pulses intact capillary refill is intact--this appears to be fluid like  Neurologic is grossly intact her speech is clear.    psych she does have severe dementia continues to be pleasant confused does follow simple verbal commands  Labs reviewed:  Recent Labs  02/08/16 0557 03/01/16 0715 03/05/16 1240  NA 136 135 133*  K 4.7 5.6* 4.1  CL 98* 95*  93*  CO2 31 34* 31  GLUCOSE 96 95 108*  BUN 26* 29* 27*  CREATININE 0.49 0.53 0.47  CALCIUM 8.6* 9.2 8.9    Recent Labs  12/06/15 0720 01/06/16 0915 02/08/16 0557  AST 17 17 17   ALT 11* 12* 10*  ALKPHOS 64 99 68  BILITOT 0.7 0.4 0.5  PROT 6.5 7.3 6.2*  ALBUMIN 3.4* 4.2 3.6    Recent Labs  12/08/15 0710 01/06/16 0915 03/01/16 0715  WBC 5.9 7.7 3.9*  NEUTROABS 4.6 6.7 2.5  HGB 12.0 13.6 12.7  HCT 37.3 42.6 40.7  MCV 86.3 88.6 90.0  PLT 199 213 163   Lab Results  Component Value Date   TSH 1.437 03/01/2016   No results found for: HGBA1C No results found for: CHOL, HDL, LDLCALC, LDLDIRECT, TRIG, CHOLHDL  Significant Diagnostic Results in last 30 days:  No results found.  Assessment/Plan  #1 left hand edema-at this point presentation appears to be fairly benign x-ray does not show any acute process we are waiting venous Dopplers which I suspect will be negative-she is having no pain with this this appears to be more be fluid-type situation-patient states-o"I've always had saggy skin"--at this point encourage elevation of the arm which may be difficult with patient's dementia-and monitor for any changes.  Also will update a CMP to see where her albumin level illness.  #2 history of diastolic CHF again she is on low-dose Lasix I do not see this edema extending anywhere pulse-I don't believe this is  CHF related-again will await lab results.  Monitor for any changes.  I note her weight is stable at around 96 pounds  #3 history of palpitation she had complain of this previously this has been assessed by Dr. Linna Darner as well-she is a poor historian but this appears to be at this point essentially asymptomatic-I suspect patient would be a poor candidate for any aggressive workup but this again appears to have stabilized.  [place], [place], [place]

## 2016-03-19 ENCOUNTER — Encounter (HOSPITAL_COMMUNITY)
Admission: RE | Admit: 2016-03-19 | Discharge: 2016-03-19 | Disposition: A | Discharge status: Home / Self Care | Payer: Medicare Other | Source: Skilled Nursing Facility | Attending: Internal Medicine | Admitting: Internal Medicine

## 2016-03-19 DIAGNOSIS — M1990 Unspecified osteoarthritis, unspecified site: Secondary | ICD-10-CM | POA: Diagnosis not present

## 2016-03-19 LAB — COMPREHENSIVE METABOLIC PANEL WITH GFR
ALT: 12 U/L — ABNORMAL LOW (ref 14–54)
AST: 17 U/L (ref 15–41)
Albumin: 3.7 g/dL (ref 3.5–5.0)
Alkaline Phosphatase: 74 U/L (ref 38–126)
Anion gap: 5 (ref 5–15)
BUN: 35 mg/dL — ABNORMAL HIGH (ref 6–20)
CO2: 33 mmol/L — ABNORMAL HIGH (ref 22–32)
Calcium: 8.8 mg/dL — ABNORMAL LOW (ref 8.9–10.3)
Chloride: 99 mmol/L — ABNORMAL LOW (ref 101–111)
Creatinine, Ser: 0.49 mg/dL (ref 0.44–1.00)
GFR calc Af Amer: >60 mL/min
GFR calc non Af Amer: >60 mL/min
Glucose, Bld: 94 mg/dL (ref 65–99)
Potassium: 4.3 mmol/L (ref 3.5–5.1)
Sodium: 137 mmol/L (ref 135–145)
Total Bilirubin: 0.3 mg/dL (ref 0.3–1.2)
Total Protein: 6.4 g/dL — ABNORMAL LOW (ref 6.5–8.1)

## 2016-03-29 ENCOUNTER — Encounter: Payer: Self-pay | Admitting: Internal Medicine

## 2016-03-29 ENCOUNTER — Non-Acute Institutional Stay (SKILLED_NURSING_FACILITY): Payer: Medicare Other | Admitting: Internal Medicine

## 2016-03-29 DIAGNOSIS — I499 Cardiac arrhythmia, unspecified: Secondary | ICD-10-CM

## 2016-03-29 DIAGNOSIS — R030 Elevated blood-pressure reading, without diagnosis of hypertension: Secondary | ICD-10-CM | POA: Diagnosis not present

## 2016-03-29 DIAGNOSIS — F0391 Unspecified dementia with behavioral disturbance: Secondary | ICD-10-CM | POA: Diagnosis not present

## 2016-03-29 DIAGNOSIS — F0392 Unspecified dementia, unspecified severity, with psychotic disturbance: Secondary | ICD-10-CM

## 2016-03-29 NOTE — Assessment & Plan Note (Signed)
No indication for medication

## 2016-03-29 NOTE — Patient Instructions (Signed)
No new orders

## 2016-03-29 NOTE — Progress Notes (Signed)
     Facility Location: [facility] Room Number: 115-W  This is a nursing facility follow up of chronic medical diagnoses  Interim medical record and care since last [facility] visit was updated with review of diagnostic studies and change in clinical status since last visit were documented.  HPI: Medical diagnoses include pulmonary hypertension, dysrhythmia, elevated blood pressure without diagnosis of hypertension, and dementia with psychosis. Despite the relatively low blood pressure today her blood pressures average 115/64. Labs are current. She's had mild azotemia with BUN of 35. She's had mild hypocalcemia values of 8.8. Total protein was also mildly reduced at 6.4.  She has had no anemia; white count had been mildly reduced at 3900.  Comprehensive review of systems is negative except for some vague discomfort in the inguinal areas when she " walks". The history is unreliable as she is nonambulatory except in a wheelchair.   Physical exam:  Pertinent or positive findings: She appears somewhat cachectic with diffuse muscular wasting of the head, neck, trunk, and extremities. Arcus senilis is present. She has resting exotropia of the right eye. She has bilateral ptosis. Heart rhythm is irregular . Abdomen is protuberant and doughy Trace edema. Pedal pulses are decreased but intact.  General appearance: no acute distress , increased work of breathing is present.   Lymphatic: No lymphadenopathy about the head, neck, axilla . Eyes: No conjunctival inflammation or lid edema is present. There is no scleral icterus. Ears:  External ear exam shows no significant lesions or deformities.   Nose:  External nasal examination shows no deformity or inflammation. Nasal mucosa are pink and moist without lesions ,exudates Oral exam: lips and gums are healthy appearing.There is no oropharyngeal erythema or exudate . Neck:  No thyromegaly, masses, tenderness noted. In fact  thyroid is not palpable.  Heart:  No gallop, murmur, click, rub .  Lungs:Chest clear to auscultation without wheezes, rhonchi,rales , rubs. Abdomen:Bowel sounds are normal. Abdomen is soft and nontender with no organomegaly, hernias,masses. GU: deferred. Extremities:  No cyanosis, clubbing  Neurologic exam : She was able to give her birthday but is otherwise disoriented. She initially told me that her mother and brother are living. She corrected this, stating that her mother died 47 years ago she thinks. She was engrossed in coloring with pencils in a book . She was very correct in staying with in the patterns despite exhibiting intention tremor of both hands. Strength equal  in upper & lower extremities Balance,Rhomberg,finger to nose testing could not be completed due to clinical state Deep tendon reflexes are equal Skin: Warm & dry w/o tenting. No significant lesions or rash.    See summary under each active problem in the Problem List with associated updated therapeutic plan

## 2016-03-29 NOTE — Assessment & Plan Note (Signed)
Rate is controlled; no change indicated

## 2016-03-29 NOTE — Assessment & Plan Note (Signed)
She exhibits no behavioral dysfunction: No change in medication indicated

## 2016-03-30 ENCOUNTER — Inpatient Hospital Stay (HOSPITAL_COMMUNITY)
Admission: EM | Admit: 2016-03-30 | Discharge: 2016-04-02 | DRG: 177 | Disposition: A | Discharge status: Skilled Nursing Facility | Payer: Medicare Other | Attending: Internal Medicine | Admitting: Internal Medicine

## 2016-03-30 ENCOUNTER — Encounter (HOSPITAL_COMMUNITY): Payer: Self-pay | Admitting: Cardiology

## 2016-03-30 ENCOUNTER — Emergency Department (HOSPITAL_COMMUNITY): Payer: Medicare Other

## 2016-03-30 DIAGNOSIS — J690 Pneumonitis due to inhalation of food and vomit: Secondary | ICD-10-CM | POA: Diagnosis present

## 2016-03-30 DIAGNOSIS — Y95 Nosocomial condition: Secondary | ICD-10-CM | POA: Diagnosis present

## 2016-03-30 DIAGNOSIS — H409 Unspecified glaucoma: Secondary | ICD-10-CM | POA: Diagnosis present

## 2016-03-30 DIAGNOSIS — I4891 Unspecified atrial fibrillation: Secondary | ICD-10-CM | POA: Diagnosis present

## 2016-03-30 DIAGNOSIS — J9601 Acute respiratory failure with hypoxia: Secondary | ICD-10-CM | POA: Diagnosis present

## 2016-03-30 DIAGNOSIS — Z9181 History of falling: Secondary | ICD-10-CM

## 2016-03-30 DIAGNOSIS — R030 Elevated blood-pressure reading, without diagnosis of hypertension: Secondary | ICD-10-CM | POA: Diagnosis present

## 2016-03-30 DIAGNOSIS — J449 Chronic obstructive pulmonary disease, unspecified: Secondary | ICD-10-CM | POA: Diagnosis present

## 2016-03-30 DIAGNOSIS — Z7189 Other specified counseling: Secondary | ICD-10-CM | POA: Insufficient documentation

## 2016-03-30 DIAGNOSIS — F0390 Unspecified dementia without behavioral disturbance: Secondary | ICD-10-CM | POA: Diagnosis present

## 2016-03-30 DIAGNOSIS — J189 Pneumonia, unspecified organism: Secondary | ICD-10-CM | POA: Diagnosis present

## 2016-03-30 DIAGNOSIS — R0902 Hypoxemia: Secondary | ICD-10-CM | POA: Diagnosis present

## 2016-03-30 DIAGNOSIS — Z87440 Personal history of urinary (tract) infections: Secondary | ICD-10-CM | POA: Diagnosis not present

## 2016-03-30 DIAGNOSIS — Z515 Encounter for palliative care: Secondary | ICD-10-CM | POA: Insufficient documentation

## 2016-03-30 DIAGNOSIS — I4892 Unspecified atrial flutter: Secondary | ICD-10-CM | POA: Diagnosis not present

## 2016-03-30 DIAGNOSIS — Z66 Do not resuscitate: Secondary | ICD-10-CM | POA: Diagnosis present

## 2016-03-30 DIAGNOSIS — Z833 Family history of diabetes mellitus: Secondary | ICD-10-CM | POA: Diagnosis not present

## 2016-03-30 DIAGNOSIS — I5032 Chronic diastolic (congestive) heart failure: Secondary | ICD-10-CM | POA: Diagnosis present

## 2016-03-30 DIAGNOSIS — R509 Fever, unspecified: Secondary | ICD-10-CM | POA: Diagnosis not present

## 2016-03-30 LAB — COMPREHENSIVE METABOLIC PANEL
ALK PHOS: 78 U/L (ref 38–126)
ALT: 18 U/L (ref 14–54)
ANION GAP: 9 (ref 5–15)
AST: 20 U/L (ref 15–41)
Albumin: 3.9 g/dL (ref 3.5–5.0)
BILIRUBIN TOTAL: 1 mg/dL (ref 0.3–1.2)
BUN: 30 mg/dL — AB (ref 6–20)
CALCIUM: 8.8 mg/dL — AB (ref 8.9–10.3)
CO2: 30 mmol/L (ref 22–32)
Chloride: 96 mmol/L — ABNORMAL LOW (ref 101–111)
Creatinine, Ser: 0.65 mg/dL (ref 0.44–1.00)
GFR calc Af Amer: >60 mL/min (ref >60)
GLUCOSE: 134 mg/dL — AB (ref 65–99)
POTASSIUM: 4.2 mmol/L (ref 3.5–5.1)
Sodium: 135 mmol/L (ref 135–145)
TOTAL PROTEIN: 7.2 g/dL (ref 6.5–8.1)

## 2016-03-30 LAB — CBC WITH DIFFERENTIAL/PLATELET
BASOS ABS: 0 10*3/uL (ref 0.0–0.1)
BASOS PCT: 0 %
EOS PCT: 0 %
Eosinophils Absolute: 0 10*3/uL (ref 0.0–0.7)
HCT: 40.5 % (ref 36.0–46.0)
Hemoglobin: 13.1 g/dL (ref 12.0–15.0)
LYMPHS PCT: 3 %
Lymphs Abs: 0.5 10*3/uL — ABNORMAL LOW (ref 0.7–4.0)
MCH: 28.8 pg (ref 26.0–34.0)
MCHC: 32.3 g/dL (ref 30.0–36.0)
MCV: 89 fL (ref 78.0–100.0)
MONO ABS: 1.2 10*3/uL — AB (ref 0.1–1.0)
Monocytes Relative: 7 %
Neutro Abs: 14.4 10*3/uL — ABNORMAL HIGH (ref 1.7–7.7)
Neutrophils Relative %: 90 %
PLATELETS: 163 10*3/uL (ref 150–400)
RBC: 4.55 MIL/uL (ref 3.87–5.11)
RDW: 16.7 % — AB (ref 11.5–15.5)
WBC: 16.1 10*3/uL — AB (ref 4.0–10.5)

## 2016-03-30 LAB — URINALYSIS, ROUTINE W REFLEX MICROSCOPIC
BILIRUBIN URINE: NEGATIVE
Glucose, UA: NEGATIVE mg/dL
KETONES UR: NEGATIVE mg/dL
NITRITE: NEGATIVE
PH: 6 (ref 5.0–8.0)
PROTEIN: 30 mg/dL — AB
Specific Gravity, Urine: 1.01 (ref 1.005–1.030)

## 2016-03-30 LAB — URINE MICROSCOPIC-ADD ON

## 2016-03-30 LAB — STREP PNEUMONIAE URINARY ANTIGEN: STREP PNEUMO URINARY ANTIGEN: NEGATIVE

## 2016-03-30 LAB — I-STAT CG4 LACTIC ACID, ED: Lactic Acid, Venous: 1.39 mmol/L (ref 0.5–1.9)

## 2016-03-30 LAB — MRSA PCR SCREENING: MRSA by PCR: NEGATIVE

## 2016-03-30 MED ORDER — POTASSIUM CHLORIDE CRYS ER 10 MEQ PO TBCR
10.0000 meq | EXTENDED_RELEASE_TABLET | Freq: Every day | ORAL | Status: DC
Start: 2016-03-30 — End: 2016-04-02
  Administered 2016-03-30 – 2016-04-02 (×4): 10 meq via ORAL
  Filled 2016-03-30 (×3): qty 1

## 2016-03-30 MED ORDER — ENOXAPARIN SODIUM 40 MG/0.4ML ~~LOC~~ SOLN: 40.0000 mg | SUBCUTANEOUS | Status: DC

## 2016-03-30 MED ORDER — DILTIAZEM HCL 100 MG IV SOLR
5.0000 mg/h | INTRAVENOUS | Status: DC
  Administered 2016-03-30 – 2016-03-31 (×2): 5 mg/h via INTRAVENOUS
  Administered 2016-03-31: 10 mg/h via INTRAVENOUS
  Administered 2016-04-01: 7.5 mg/h via INTRAVENOUS
  Filled 2016-03-30 (×5): qty 100

## 2016-03-30 MED ORDER — DEXTROSE 5 % IV SOLN: 1.0000 g | Freq: Three times a day (TID) | INTRAVENOUS | Status: DC

## 2016-03-30 MED ORDER — CEFEPIME HCL 1 G IJ SOLR
1.0000 g | INTRAMUSCULAR | Status: DC
  Administered 2016-03-30 – 2016-04-02 (×4): 1 g via INTRAVENOUS
  Filled 2016-03-30 (×5): qty 1

## 2016-03-30 MED ORDER — DILTIAZEM LOAD VIA INFUSION
10.0000 mg | Freq: Once | INTRAVENOUS | Status: AC
  Administered 2016-03-30: 10 mg via INTRAVENOUS
  Filled 2016-03-30: qty 10

## 2016-03-30 MED ORDER — SODIUM CHLORIDE 0.9 % IV BOLUS (SEPSIS)
500.0000 mL | Freq: Once | INTRAVENOUS | Status: AC
  Administered 2016-03-30: 500 mL via INTRAVENOUS

## 2016-03-30 MED ORDER — SODIUM CHLORIDE 0.9 % IV BOLUS (SEPSIS)
1000.0000 mL | Freq: Once | INTRAVENOUS | Status: AC
  Administered 2016-03-30: 1000 mL via INTRAVENOUS

## 2016-03-30 MED ORDER — VANCOMYCIN HCL IN DEXTROSE 750-5 MG/150ML-% IV SOLN
750.0000 mg | INTRAVENOUS | Status: DC
  Administered 2016-03-31 – 2016-04-02 (×3): 750 mg via INTRAVENOUS
  Filled 2016-03-30 (×4): qty 150

## 2016-03-30 MED ORDER — VANCOMYCIN HCL IN DEXTROSE 1-5 GM/200ML-% IV SOLN
1000.0000 mg | Freq: Once | INTRAVENOUS | Status: AC
  Administered 2016-03-30: 1000 mg via INTRAVENOUS
  Filled 2016-03-30: qty 200

## 2016-03-30 MED ORDER — PIPERACILLIN-TAZOBACTAM 3.375 G IVPB 30 MIN
3.3750 g | Freq: Once | INTRAVENOUS | Status: AC
  Administered 2016-03-30: 3.375 g via INTRAVENOUS
  Filled 2016-03-30: qty 50

## 2016-03-30 MED ORDER — ENOXAPARIN SODIUM 30 MG/0.3ML ~~LOC~~ SOLN
30.0000 mg | SUBCUTANEOUS | Status: DC
  Administered 2016-03-31 – 2016-04-02 (×3): 30 mg via SUBCUTANEOUS
  Filled 2016-03-30 (×3): qty 0.3

## 2016-03-30 MED ORDER — LATANOPROST 0.005 % OP SOLN
1.0000 [drp] | Freq: Every day | OPHTHALMIC | Status: DC
  Administered 2016-03-30 – 2016-04-01 (×3): 1 [drp] via OPHTHALMIC
  Filled 2016-03-30: qty 2.5

## 2016-03-30 MED ORDER — LEVALBUTEROL HCL 1.25 MG/0.5ML IN NEBU: 1.2500 mg | INHALATION_SOLUTION | Freq: Four times a day (QID) | RESPIRATORY_TRACT | Status: DC | PRN

## 2016-03-30 MED ORDER — ASPIRIN 81 MG PO CHEW
81.0000 mg | CHEWABLE_TABLET | Freq: Every day | ORAL | Status: DC
  Administered 2016-03-30 – 2016-04-02 (×4): 81 mg via ORAL
  Filled 2016-03-30 (×5): qty 1

## 2016-03-30 MED ORDER — DILTIAZEM HCL 25 MG/5ML IV SOLN: 10.0000 mg | Freq: Once | INTRAVENOUS | Status: AC

## 2016-03-30 MED ORDER — ALPRAZOLAM 0.25 MG PO TABS
0.2500 mg | ORAL_TABLET | Freq: Two times a day (BID) | ORAL | Status: DC | PRN
  Administered 2016-03-30 – 2016-04-01 (×4): 0.25 mg via ORAL
  Filled 2016-03-30 (×4): qty 1

## 2016-03-30 MED ORDER — ACETAMINOPHEN 325 MG PO TABS: 650.0000 mg | ORAL_TABLET | Freq: Once | ORAL | Status: DC

## 2016-03-30 MED ORDER — MIRTAZAPINE 15 MG PO TABS
7.5000 mg | ORAL_TABLET | Freq: Every day | ORAL | Status: DC
  Administered 2016-03-30 – 2016-04-01 (×3): 7.5 mg via ORAL
  Filled 2016-03-30: qty 0.5
  Filled 2016-03-30 (×2): qty 1
  Filled 2016-03-30: qty 0.5
  Filled 2016-03-30: qty 1

## 2016-03-30 NOTE — ED Notes (Signed)
Nurse unavailable to take report.

## 2016-03-30 NOTE — H&P (Signed)
[facility] History and Physical  [name] [id number] DOB: Feb 26, 1926 DOA: 03/30/2016  Referring physician: ER  PCP: Unice Cobble, MD   Chief Complaint: Shortness of breath   HPI:  82 year old female with history of chronic constipation, dementia, living in [facility] memory [facility] unit, history of fluttering in her chest, previously seen by Dr. Domenic Polite cardiology for arrhythmia, grade 1 diastolic heart failure with mild mitral regurgitation, who presents [facility] the ER [facility] with fever, desaturation into the 70s, increased work of breathing. Patient has no recollection of the events and most of the history is obtained from the caregiver. Caregiver noted yesterday that the patient's heart rate was ranging from 35-135. Her blood pressure was also low in into the 80s.chest x-ray today shows right upper lobe pneumonia. Patient found to be in atrial fibrillation with rapid ventricular response with heart rate in the 130s      Review of Systems: negative for the following  Constitutional: Positive for fever, chills, diaphoresis, appetite change and fatigue.  HEENT: Denies photophobia, eye pain, redness, hearing loss, ear pain, congestion, sore throat, rhinorrhea, sneezing, mouth sores, trouble swallowing, neck pain, neck stiffness and tinnitus.  Respiratory: Positive for SOB, DOE, cough, chest tightness, and wheezing.  Cardiovascular: Denies chest pain, palpitations and leg swelling.  Gastrointestinal: Denies nausea, vomiting, abdominal pain, diarrhea, constipation, blood in stool and abdominal distention.  Genitourinary: Denies dysuria, urgency, frequency, hematuria, flank pain and difficulty urinating.  Musculoskeletal: Denies myalgias, back pain, joint swelling, arthralgias and gait problem.  Skin: Denies pallor, rash and wound.  Neurological: Denies dizziness, seizures, syncope, weakness, light-headedness, numbness and headaches.  Hematological: Denies  adenopathy. Easy bruising, personal or family bleeding history  Psychiatric/Behavioral: Denies suicidal ideation, mood changes, confusion, nervousness, sleep disturbance and agitation       Past Medical History  Diagnosis Date  . Clostridium difficile colitis     dating back to 2004  . Glaucoma   . Diverticulosis   . Diverticulitis   . Recurrent UTI   . Chronic constipation   . Nausea   . Dementia   . Hypertension   . Murmur, cardiac   . Gait instability     Uses [place] or cane toambulate     Past Surgical History  Procedure Laterality Date  . Orif right hip  2004    Dr.Harrison  . Bilateral eyelid surgery    . Colonoscopy  10/30/04    NUR  . Vulvectomy N/A 06/14/2014    Procedure: PARTIAL MODIFED ANTERIOR RADICAL VULVECTOMY ;  Surgeon: Everitt Amber, MD;  Location: [facility];  Service: Gynecology;  Laterality: N/A;      Social History:  reports that she has never smoked. She has never used smokeless tobacco. She reports that she does not drink alcohol or use illicit drugs.    Allergies  Allergen Reactions  . Flagyl [Metronidazole Hcl]   . Other     Most antibiotics causes severe c. Difficille.  Nuts and any food containing seeds  . Ciprofloxacin Rash    Family History  Problem Relation Age of Onset  . Diabetes type II          Prior to Admission medications   Medication Sig Start Date End Date Taking? Authorizing Provider  acetaminophen (TYLENOL) 500 MG tablet Take one tablet by mouth twice daily   Yes Historical Provider, MD  ALPRAZolam (XANAX) 0.25 MG tablet Take one tablet by mouth every 4 hours as needed for nerves   Yes Historical  Provider, MD  aspirin 81 MG tablet Take 81 mg by mouth daily.   Yes Historical Provider, MD  ENSURE (ENSURE) Take 237 mLs by mouth 2 (two) times daily between meals. Take by mouth twice daily between meals   Yes Historical Provider, MD  furosemide (LASIX) 20 MG tablet Take one tablet by mouth once daily   Yes Historical  Provider, MD  ipratropium-albuterol (DUONEB) 0.5-2.5 (3) MG/3ML SOLN Take 3 mLs by nebulization every 6 (six) hours as needed. For cough,chest congestion, wheezing   Yes Historical Provider, MD  latanoprost (XALATAN) 0.005 % ophthalmic solution Place 1 drop into both eyes at bedtime.    Yes Historical Provider, MD  mirtazapine (REMERON) 15 MG tablet Take 7.5 mg by mouth at bedtime.   Yes Historical Provider, MD  Multiple Vitamins-Minerals (MULTIVITAMIN WITH MINERALS) tablet Take 1 tablet by mouth daily.     Yes Historical Provider, MD  Pollen Extracts (PROSTAT PO) Give 28ml by mouth twice daily to promote wound healing. Mix with Ensure Clear +vanilla Ice cream twice daily between meals   Yes Historical Provider, MD  potassium chloride (MICRO-K) 10 MEQ CR capsule Take 10 mEq by mouth 2 (two) times daily.   Yes Historical Provider, MD  traMADol (ULTRAM) 50 MG tablet Take one tablet by mouth every 4 hours as needed for pain; Take one tablet by mouth once daily for back pain   Yes Historical Provider, MD  OXYGEN May give oxygen at 2 l/min via nasal cannula as needed    Historical Provider, MD     Physical Exam: Filed Vitals:   03/30/16 1100 03/30/16 1115 03/30/16 1200 03/30/16 1232  BP: 102/66 104/67 102/56   Pulse: 122 117 111   Temp:    97.5 F (36.4 C)  TempSrc:      Resp: 27 21 23    Height:      Weight:      SpO2: 93% 96% 93%       Constitutional: NAD, calm, comfortable Filed Vitals:   03/30/16 1100 03/30/16 1115 03/30/16 1200 03/30/16 1232  BP: 102/66 104/67 102/56   Pulse: 122 117 111   Temp:    97.5 F (36.4 C)  TempSrc:      Resp: 27 21 23    Height:      Weight:      SpO2: 93% 96% 93%    Eyes: PERRL, lids and conjunctivae normal, Confused ENMT: Mucous membranes are moist. Posterior pharynx clear of any exudate or lesions.Normal dentition.  Neck: normal, supple, no masses, no thyromegaly Respiratory: clear to auscultation bilaterally, no wheezing, no crackles. Normal  respiratory effort. No accessory muscle use.  Cardiovascular: Tachycardia with irregularly irregular heart rate, no murmurs / rubs / gallops. No extremity edema. 2+ pedal pulses. No carotid bruits.  Abdomen: no tenderness, no masses palpated. No hepatosplenomegaly. Bowel sounds positive.  Musculoskeletal: no clubbing / cyanosis. No joint deformity upper and lower extremities. Good ROM, no contractures. Normal muscle tone.  Skin: no rashes, lesions, ulcers. No induration Neurologic: CN 2-12 grossly intact. Sensation intact, DTR normal. Strength 5/5 in all 4.  Psychiatric: Normal judgment and insight. Alert and oriented x 3. Normal mood.     Labs on Admission: I have personally reviewed following labs and imaging studies  CBC:  Recent Labs Lab 03/30/16 0900  WBC 16.1*  NEUTROABS 14.4*  HGB 13.1  HCT 40.5  MCV 89.0  PLT [id number]    Basic Metabolic Panel:  Recent Labs Lab 03/30/16 0900  NA 135  K 4.2  CL 96*  CO2 30  GLUCOSE 134*  BUN 30*  CREATININE 0.65  CALCIUM 8.8*    GFR: Estimated Creatinine Clearance: 34.4 mL/min (by C-G formula based on Cr of 0.65).  Liver Function Tests:  Recent Labs Lab 03/30/16 0900  AST 20  ALT 18  ALKPHOS 78  BILITOT 1.0  PROT 7.2  ALBUMIN 3.9   No results for input(s): LIPASE, AMYLASE in the last 168 hours. No results for input(s): AMMONIA in the last 168 hours.  Coagulation Profile: No results for input(s): INR, PROTIME in the last 168 hours. No results for input(s): DDIMER in the last 72 hours.  Cardiac Enzymes: No results for input(s): CKTOTAL, CKMB, CKMBINDEX, TROPONINI in the last 168 hours.  BNP (last 3 results) No results for input(s): PROBNP in the last 8760 hours.  HbA1C: No results for input(s): HGBA1C in the last 72 hours. No results found for: HGBA1C   CBG: No results for input(s): GLUCAP in the last 168 hours.  Lipid Profile: No results for input(s): CHOL, HDL, LDLCALC, TRIG, CHOLHDL, LDLDIRECT in the last  72 hours.  Thyroid Function Tests: No results for input(s): TSH, T4TOTAL, FREET4, T3FREE, THYROIDAB in the last 72 hours.  Anemia Panel: No results for input(s): VITAMINB12, FOLATE, FERRITIN, TIBC, IRON, RETICCTPCT in the last 72 hours.  Urine analysis:    Component Value Date/Time   COLORURINE YELLOW 04/02/2015 1900   APPEARANCEUR HAZY* 04/02/2015 1900   LABSPEC 1.010 04/02/2015 1900   PHURINE 8.0 04/02/2015 1900   GLUCOSEU NEGATIVE 04/02/2015 1900   HGBUR TRACE* 04/02/2015 1900   BILIRUBINUR NEGATIVE 04/02/2015 1900   KETONESUR NEGATIVE 04/02/2015 1900   PROTEINUR 30* 04/02/2015 1900   UROBILINOGEN 0.2 04/02/2015 1900   NITRITE POSITIVE* 04/02/2015 1900   LEUKOCYTESUR MODERATE* 04/02/2015 1900      ) Recent Results (from the past 240 hour(s))  Blood Culture (routine x 2)     Status: None (Preliminary result)   Collection Time: 03/30/16  9:05 AM  Result Value Ref Range Status   Specimen Description BLOOD RIGHT ARM  Final   Special Requests BOTTLES DRAWN AEROBIC AND ANAEROBIC 8CC EACH  Final   Culture NO GROWTH < 12 HOURS  Final   Report Status PENDING  Incomplete  Blood Culture (routine x 2)     Status: None (Preliminary result)   Collection Time: 03/30/16  9:05 AM  Result Value Ref Range Status   Specimen Description BLOOD LEFT ARM  Final   Special Requests BOTTLES DRAWN AEROBIC AND ANAEROBIC 8 CC EACH  Final   Culture NO GROWTH < 12 HOURS  Final   Report Status PENDING  Incomplete         Radiological Exams on Admission: Dg Chest 2 View  03/30/2016  CLINICAL DATA:  Fever and altered mental status.  Hypoxia. EXAM: CHEST  2 VIEW COMPARISON:  Two view chest x-ray 12/05/2015 and thoracic spine radiographs 12/13/2015. FINDINGS: The heart is mildly enlarged. There is no edema or effusion to suggest failure. Severe emphysema is again noted. Right upper lobe pneumonia is present. This involves a larger area than on the previous chest x-ray. Degenerative changes of the  thoracic spine are stable. No acute osseous abnormality is present. Atherosclerotic calcifications are present in the aorta. Marked degenerative changes of the shoulders bilaterally are worse on the right. IMPRESSION: 1. Right upper lobe pneumonia. 2. Severe emphysema. 3. Mild cardiomegaly without failure. 4. Atherosclerosis of the thoracic aorta. Electronically Signed   By: Harrell Gave  Mattern M.D.   On: 03/30/2016 09:56   Dg Chest 2 View  03/30/2016  CLINICAL DATA:  Fever and altered mental status.  Hypoxia. EXAM: CHEST  2 VIEW COMPARISON:  Two view chest x-ray 12/05/2015 and thoracic spine radiographs 12/13/2015. FINDINGS: The heart is mildly enlarged. There is no edema or effusion to suggest failure. Severe emphysema is again noted. Right upper lobe pneumonia is present. This involves a larger area than on the previous chest x-ray. Degenerative changes of the thoracic spine are stable. No acute osseous abnormality is present. Atherosclerotic calcifications are present in the aorta. Marked degenerative changes of the shoulders bilaterally are worse on the right. IMPRESSION: 1. Right upper lobe pneumonia. 2. Severe emphysema. 3. Mild cardiomegaly without failure. 4. Atherosclerosis of the thoracic aorta. Electronically Signed   By: San Morelle M.D.   On: 03/30/2016 09:56      EKG: Independently reviewed. Atrial fibrillation  Assessment/Plan Principal Problem:   Acute hypoxemic respiratory failure Patient found to be in atrial fibrillation with rapid ventricular response along with right middle lobe pneumonia  Atrial fibrillation with rapid ventricular response No prior history of arrhythmias except palpitations Cardiology consulted for further evaluation and recommendations for anticoagulation Start patient on Cardizem drip Probably not a candidate for long-term anticoagulation due to advanced age and dementia   Aspiration pneumonia-patient will be treated with broad-spectrum  antibiotics for healthcare associated pneumonia Continue nebulizer treatments Blood culture 2   Hypertension-continue Cardizem, controlled    DVT prophylaxis: Lovenox     Code Status Orders DO NOT RESUSCITATE         Start     Ordered     Family Communication: discussed with patient's Daughter/caregiver By the bedside   Disposition Plan:  Anticipate discharge in 2-3 days pending further workup and clinical progress  Consults called: Cardiology  Admission status: Inpatient stepdown  Total time spent 55 minutes.Greater than 50% of this time was spent in counseling, explanation of diagnosis, planning of further management, and coordination of care  [facility] MD [facility] Pager [phone]  If 7PM-7AM, please contact night-coverage [email or web address] Password [email or web address]  03/30/2016, 2:58 PM

## 2016-03-30 NOTE — ED Notes (Signed)
Sent here from [facility] with sats in the 70's and fever.  Pt denies any complaints.  States she feels like she does every morning.

## 2016-03-30 NOTE — ED Provider Notes (Signed)
CSN: [id number]     Arrival date & time 03/30/16  0827 History   By signing my name below, I, [name], attest that this documentation has been prepared under the direction and in the presence of Merrily Pew, MD.  Electronically Signed: Tedra [name]. Sheppard Coil, ED Scribe. 03/30/2016. 9:51 AM.   Chief Complaint  Patient presents with  . Fever   The history is provided by the patient and a caregiver. No language interpreter was used.    HPI Comments: [name] is a 82 y.o. female brought in by ambulance from [facility] with PMHx of UTI, Dementia, HTN, and C. Diff colitis who presents to the Emergency Department complaining of gradual onset, constant fever x 3 hrs PTA. Per pt's caregiver, pt has had an altered mental status for 1 day claiming "she was not acting right at lunch yesterday." Pt notes that she feels normal like she does every morning. Caregiver also notes patient has had low oxygen saturation for 1 day. PTA, vitals were checked on patient by nursing staff at facility and she states oxygen saturation was in the high 70's, low 80's and she also had a low-grade fever. Denies any cough, SOB, or leg swelling.  Past Medical History  Diagnosis Date  . Clostridium difficile colitis     dating back to 2004  . Glaucoma   . Diverticulosis   . Diverticulitis   . Recurrent UTI   . Chronic constipation   . Nausea   . Dementia   . Hypertension   . Murmur, cardiac   . Gait instability     Uses [place] or cane toambulate   Past Surgical History  Procedure Laterality Date  . Orif right hip  2004    Dr.Harrison  . Bilateral eyelid surgery    . Colonoscopy  10/30/04    NUR  . Vulvectomy N/A 06/14/2014    Procedure: PARTIAL MODIFED ANTERIOR RADICAL VULVECTOMY ;  Surgeon: Everitt Amber, MD;  Location: [facility];  Service: Gynecology;  Laterality: N/A;   Family History  Problem Relation Age of Onset  . Diabetes type II     Social History  Substance Use Topics  . Smoking  status: Never Smoker   . Smokeless tobacco: Never Used  . Alcohol Use: No   OB History    No data available     Review of Systems  Constitutional: Positive for fever.  Respiratory: Negative for cough and shortness of breath.   Cardiovascular: Negative for leg swelling.  All other systems reviewed and are negative.   Allergies  Flagyl; Other; and Ciprofloxacin  Home Medications   Prior to Admission medications   Medication Sig Start Date End Date Taking? Authorizing Provider  acetaminophen (TYLENOL) 500 MG tablet Take one tablet by mouth twice daily   Yes Historical Provider, MD  ALPRAZolam (XANAX) 0.25 MG tablet Take one tablet by mouth every 4 hours as needed for nerves   Yes Historical Provider, MD  aspirin 81 MG tablet Take 81 mg by mouth daily.   Yes Historical Provider, MD  ENSURE (ENSURE) Take 237 mLs by mouth 2 (two) times daily between meals. Take by mouth twice daily between meals   Yes Historical Provider, MD  furosemide (LASIX) 20 MG tablet Take one tablet by mouth once daily   Yes Historical Provider, MD  ipratropium-albuterol (DUONEB) 0.5-2.5 (3) MG/3ML SOLN Take 3 mLs by nebulization every 6 (six) hours as needed. For cough,chest congestion, wheezing   Yes Historical Provider,  MD  latanoprost (XALATAN) 0.005 % ophthalmic solution Place 1 drop into both eyes at bedtime.    Yes Historical Provider, MD  mirtazapine (REMERON) 15 MG tablet Take 7.5 mg by mouth at bedtime.   Yes Historical Provider, MD  Multiple Vitamins-Minerals (MULTIVITAMIN WITH MINERALS) tablet Take 1 tablet by mouth daily.     Yes Historical Provider, MD  Pollen Extracts (PROSTAT PO) Give 66ml by mouth twice daily to promote wound healing. Mix with Ensure Clear +vanilla Ice cream twice daily between meals   Yes Historical Provider, MD  potassium chloride (MICRO-K) 10 MEQ CR capsule Take 10 mEq by mouth 2 (two) times daily.   Yes Historical Provider, MD  traMADol (ULTRAM) 50 MG tablet Take one tablet by  mouth every 4 hours as needed for pain; Take one tablet by mouth once daily for back pain   Yes Historical Provider, MD  OXYGEN May give oxygen at 2 l/min via nasal cannula as needed    Historical Provider, MD   BP 102/56 mmHg  Pulse 111  Temp(Src) 97.5 F (36.4 C) (Rectal)  Resp 23  Ht 5\' 6"  (1.676 m)  Wt 97 lb (43.999 kg)  BMI 15.66 kg/m2  SpO2 93% Physical Exam  Constitutional: She is oriented to person, place, and time. She appears well-developed. No distress.  HENT:  Head: Normocephalic and atraumatic.  Eyes: Conjunctivae are normal.  Cardiovascular: Exam reveals no gallop and no friction rub.   No murmur heard. Tachycardic with intermittent Atrial Fibrillation.  Pulmonary/Chest: Effort normal.  Lung are clear. Tachypnea. Hypoxic on oxygen.  Abdominal: Soft. Bowel sounds are normal. She exhibits no distension. There is no tenderness. There is no rebound and no guarding.  Musculoskeletal: She exhibits no edema or tenderness.  No posterior leg tenderness. No lower extremity edema.  Neurological: She is alert and oriented to person, place, and time.  Skin: Skin is warm and dry.  Psychiatric: She has a normal mood and affect.  Nursing note and vitals reviewed.   ED Course  Procedures (including critical care time)  CRITICAL CARE Performed by: Merrily Pew Total critical care time: 35 minutes Critical care time was exclusive of separately billable procedures and treating other patients. Critical care was necessary to treat or prevent imminent or life-threatening deterioration. Critical care was time spent personally by me on the following activities: development of treatment plan with patient and/or surrogate as well as nursing, discussions with consultants, evaluation of patient's response to treatment, examination of patient, obtaining history from patient or surrogate, ordering and performing treatments and interventions, ordering and review of laboratory studies, ordering  and review of radiographic studies, pulse oximetry and re-evaluation of patient's condition.  DIAGNOSTIC STUDIES: Oxygen Saturation is 94% on 2L/min via Pax, adequate by my interpretation.    COORDINATION OF CARE: 8:42 AM-Discussed treatment plan which includes CMP, CBC, UA, EKG, and O2 therapy with pt at bedside and pt agreed to plan. Will order Zosyn and Vancomycin.  Labs Review Labs Reviewed  COMPREHENSIVE METABOLIC PANEL - Abnormal; Notable for the following:    Chloride 96 (*)    Glucose, Bld 134 (*)    BUN 30 (*)    Calcium 8.8 (*)    All other components within normal limits  CBC WITH DIFFERENTIAL/PLATELET - Abnormal; Notable for the following:    WBC 16.1 (*)    RDW 16.7 (*)    Neutro Abs 14.4 (*)    Lymphs Abs 0.5 (*)    Monocytes Absolute 1.2 (*)  All other components within normal limits  CULTURE, BLOOD (ROUTINE X 2)  CULTURE, BLOOD (ROUTINE X 2)  URINE CULTURE  CULTURE, EXPECTORATED SPUTUM-ASSESSMENT  GRAM STAIN  RESPIRATORY PANEL BY PCR  MRSA PCR SCREENING  URINALYSIS, ROUTINE W REFLEX MICROSCOPIC (NOT AT [facility])  STREP PNEUMONIAE URINARY ANTIGEN  INFLUENZA PANEL BY PCR (TYPE A & B, H1N1)  LEGIONELLA PNEUMOPHILA SEROGP 1 UR AG  I-STAT CG4 LACTIC ACID, ED  I-STAT CG4 LACTIC ACID, ED    Imaging Review Dg Chest 2 View  03/30/2016  CLINICAL DATA:  Fever and altered mental status.  Hypoxia. EXAM: CHEST  2 VIEW COMPARISON:  Two view chest x-ray 12/05/2015 and thoracic spine radiographs 12/13/2015. FINDINGS: The heart is mildly enlarged. There is no edema or effusion to suggest failure. Severe emphysema is again noted. Right upper lobe pneumonia is present. This involves a larger area than on the previous chest x-ray. Degenerative changes of the thoracic spine are stable. No acute osseous abnormality is present. Atherosclerotic calcifications are present in the aorta. Marked degenerative changes of the shoulders bilaterally are worse on the right. IMPRESSION: 1. Right  upper lobe pneumonia. 2. Severe emphysema. 3. Mild cardiomegaly without failure. 4. Atherosclerosis of the thoracic aorta. Electronically Signed   By: San Morelle M.D.   On: 03/30/2016 09:56   I have personally reviewed and evaluated these images and lab results as part of my medical decision-making.   EKG Interpretation   Date/Time:  Friday March 30 2016 08:31:17 EDT Ventricular Rate:  142 PR Interval:    QRS Duration: 91 QT Interval:  323 QTC Calculation: 497 R Axis:   -73 Text Interpretation:  Atrial fibrillation with rapid V-rate Left anterior  fascicular block Probable anterior infarct, old Repolarization  abnormality, prob rate related Baseline wander in lead(s) II III aVF V5  Confirmed by Leeland Lovelady MD, Corene Cornea [id number]) on 03/30/2016 9:13:59 AM      MDM   Final diagnoses:  Hypoxia  Healthcare-associated pneumonia  Acute respiratory failure with hypoxia ([place])    Code sepsis for HCAP. Empiric broad spectrum abx given. Fluids administered. Lactic acid normal. Plan for admission to medicine for further management.     I personally performed the services described in this documentation, which was scribed in my presence. The recorded information has been reviewed and is accurate.    Merrily Pew, MD 03/30/16 [phone]

## 2016-03-30 NOTE — ED Notes (Signed)
Attempted in and out cath times 2 without success.

## 2016-03-30 NOTE — Progress Notes (Signed)
Pharmacy Antibiotic Note  [name] is a 82 y.o. female admitted on 03/30/2016 with pneumonia.  Pharmacy has been consulted for Vancomycin dosing.  Plan: Vancomycin 1gm loading dose given in ED, the 750mg   IV every 24 hours.  Goal trough 15-20 mcg/mL.  Height: 5\' 6"  (167.6 cm) Weight: 97 lb (43.999 kg) IBW/kg (Calculated) : 59.3  Temp (24hrs), Avg:98.5 F (36.9 C), Min:97.4 F (36.3 C), Max:99.6 F (37.6 C)   Recent Labs Lab 03/30/16 0858 03/30/16 0900  WBC  --  16.1*  CREATININE  --  0.65  LATICACIDVEN 1.39  --     Estimated Creatinine Clearance: 34.4 mL/min (by C-G formula based on Cr of 0.65).    Allergies  Allergen Reactions  . Flagyl [Metronidazole Hcl]   . Other     Most antibiotics causes severe c. Difficille.  Nuts and any food containing seeds  . Ciprofloxacin Rash    Antimicrobials this admission: Vancomycin 6/30 >>  Zosyn x 1 dose in ED 6/30 >>  Cefepime 6/30>>  Dose adjustments this admission: Cefepime dose adjustment for Crcl 71mlmin to 1gm IV q24h.  Microbiology results: 6/30 BCx: pending 6/30 UCx: pending  Thank you for allowing pharmacy to be a part of this patient's care. Isac Sarna, BS Pharm D, [place] Clinical Pharmacist Pager [phone] 03/30/2016 11:24 AM

## 2016-03-31 DIAGNOSIS — I4891 Unspecified atrial fibrillation: Secondary | ICD-10-CM

## 2016-03-31 DIAGNOSIS — J189 Pneumonia, unspecified organism: Secondary | ICD-10-CM

## 2016-03-31 LAB — CBC
HCT: 39.6 % (ref 36.0–46.0)
Hemoglobin: 12.1 g/dL (ref 12.0–15.0)
MCH: 28.1 pg (ref 26.0–34.0)
MCHC: 30.6 g/dL (ref 30.0–36.0)
MCV: 92.1 fL (ref 78.0–100.0)
PLATELETS: 192 10*3/uL (ref 150–400)
RBC: 4.3 MIL/uL (ref 3.87–5.11)
RDW: 16.7 % — ABNORMAL HIGH (ref 11.5–15.5)
WBC: 16.6 10*3/uL — AB (ref 4.0–10.5)

## 2016-03-31 LAB — COMPREHENSIVE METABOLIC PANEL
ALT: 18 U/L (ref 14–54)
AST: 21 U/L (ref 15–41)
Albumin: 3.4 g/dL — ABNORMAL LOW (ref 3.5–5.0)
Alkaline Phosphatase: 98 U/L (ref 38–126)
Anion gap: 10 (ref 5–15)
BUN: 26 mg/dL — AB (ref 6–20)
CHLORIDE: 99 mmol/L — AB (ref 101–111)
CO2: 28 mmol/L (ref 22–32)
CREATININE: 0.52 mg/dL (ref 0.44–1.00)
Calcium: 8.6 mg/dL — ABNORMAL LOW (ref 8.9–10.3)
GFR calc Af Amer: >60 mL/min (ref >60)
GFR calc non Af Amer: >60 mL/min (ref >60)
Glucose, Bld: 150 mg/dL — ABNORMAL HIGH (ref 65–99)
Potassium: 4.3 mmol/L (ref 3.5–5.1)
SODIUM: 137 mmol/L (ref 135–145)
Total Bilirubin: 0.6 mg/dL (ref 0.3–1.2)
Total Protein: 6.5 g/dL (ref 6.5–8.1)

## 2016-03-31 LAB — INFLUENZA PANEL BY PCR (TYPE A & B)
H1N1 flu by pcr: NOT DETECTED
INFLBPCR: NEGATIVE
Influenza A By PCR: NEGATIVE

## 2016-03-31 MED ORDER — DM-GUAIFENESIN ER 30-600 MG PO TB12
1.0000 | ORAL_TABLET | Freq: Two times a day (BID) | ORAL | Status: DC
  Administered 2016-03-31 – 2016-04-02 (×5): 1 via ORAL
  Filled 2016-03-31 (×5): qty 1

## 2016-03-31 NOTE — Progress Notes (Signed)
PROGRESS NOTE    [name]  [id number] DOB: 04-16-1926 DOA: 03/30/2016 PCP: Unice Cobble, MD     Brief Narrative:  82 year old woman admitted on 6/30 from her SNF due to complaints of shortness of breath. She was found to have a fever, was desaturating into the 70s with increased work of breathing. Was found to have a pneumonia on chest x-ray and was also found to be in A. fib with RVR and admission was requested.   Assessment & Plan:   Principal Problem:   Hypoxia Active Problems:   Elevated blood-pressure reading without diagnosis of hypertension   Atrial fibrillation with RVR (HCC)   Healthcare-associated pneumonia   HCAP (healthcare-associated pneumonia)   Hospital acquired/aspiration pneumonia/acute hypoxemic respiratory failure -Culture data pending. -Continue vancomycin and cefepime. -We'll request a speech therapy evaluation given concerns for recurrent aspiration. -We'll also request a palliative care evaluation given her poor state even at baseline. -Add Mucinex as she has a lot of phlegm in her chest.  Atrial fibrillation with rapid ventricular response -Remains on Cardizem drip at 10 mg per hour with rates in the 110s. -Plan to continue Cardizem drip today until rates are better controlled. -Believe she would be a high risk for anticoagulation due to her advanced age, dementia and history of falls. -Continue aspirin only.  Hypertension -Currently controlled on Cardizem drip.  Dementia -No behavioral disturbances   DVT prophylaxis: Lovenox Code Status: DO NOT RESUSCITATE Family Communication: Patient only Disposition Plan: Keep in ICU  Consultants:   None  Procedures:   None  Antimicrobials:   Vancomycin  Cefepime    Subjective: Lying in bed, not very interactive, unsure of her baseline  Objective: Filed Vitals:   03/31/16 0915 03/31/16 0930 03/31/16 0945 03/31/16 1000  BP:    121/63  Pulse:    103  Temp:      TempSrc:       Resp: 28 23    Height:      Weight:      SpO2: 86% 89% 93% 80%    Intake/Output Summary (Last 24 hours) at 03/31/16 1043 Last data filed at 03/31/16 0933  Gross per 24 hour  Intake    590 ml  Output    550 ml  Net     40 ml   Filed Weights   03/30/16 0831 03/30/16 1500 03/31/16 0400  Weight: 43.999 kg (97 lb) 48.2 kg (106 lb 4.2 oz) 46.3 kg (102 lb 1.2 oz)    Examination:  General exam: Awake Respiratory system: Increased work of breathing, coarse bilateral breath sounds, no wheezes Cardiovascular system: Tachycardic, irregular rhythm Gastrointestinal system: Abdomen is nondistended, soft and nontender. No organomegaly or masses felt. Normal bowel sounds heard. Central nervous system: Alert and oriented. No focal neurological deficits. Extremities: No C/C/E, +pedal pulses Skin: No rashes, lesions or ulcers Psychiatry: Unable to fully assess given current mental state    Data Reviewed: I have personally reviewed following labs and imaging studies  CBC:  Recent Labs Lab 03/30/16 0900 03/31/16 0432  WBC 16.1* 16.6*  NEUTROABS 14.4*  --   HGB 13.1 12.1  HCT 40.5 39.6  MCV 89.0 92.1  PLT 163 [id number]   Basic Metabolic Panel:  Recent Labs Lab 03/30/16 0900 03/31/16 0432  NA 135 137  K 4.2 4.3  CL 96* 99*  CO2 30 28  GLUCOSE 134* 150*  BUN 30* 26*  CREATININE 0.65 0.52  CALCIUM 8.8* 8.6*   GFR: Estimated Creatinine Clearance: 36.2 mL/min (  by C-G formula based on Cr of 0.52). Liver Function Tests:  Recent Labs Lab 03/30/16 0900 03/31/16 0432  AST 20 21  ALT 18 18  ALKPHOS 78 98  BILITOT 1.0 0.6  PROT 7.2 6.5  ALBUMIN 3.9 3.4*   No results for input(s): LIPASE, AMYLASE in the last 168 hours. No results for input(s): AMMONIA in the last 168 hours. Coagulation Profile: No results for input(s): INR, PROTIME in the last 168 hours. Cardiac Enzymes: No results for input(s): CKTOTAL, CKMB, CKMBINDEX, TROPONINI in the last 168 hours. BNP (last 3  results) No results for input(s): PROBNP in the last 8760 hours. HbA1C: No results for input(s): HGBA1C in the last 72 hours. CBG: No results for input(s): GLUCAP in the last 168 hours. Lipid Profile: No results for input(s): CHOL, HDL, LDLCALC, TRIG, CHOLHDL, LDLDIRECT in the last 72 hours. Thyroid Function Tests: No results for input(s): TSH, T4TOTAL, FREET4, T3FREE, THYROIDAB in the last 72 hours. Anemia Panel: No results for input(s): VITAMINB12, FOLATE, FERRITIN, TIBC, IRON, RETICCTPCT in the last 72 hours. Urine analysis:    Component Value Date/Time   COLORURINE YELLOW 03/30/2016 1502   APPEARANCEUR CLEAR 03/30/2016 1502   LABSPEC 1.010 03/30/2016 1502   PHURINE 6.0 03/30/2016 1502   GLUCOSEU NEGATIVE 03/30/2016 1502   HGBUR LARGE* 03/30/2016 1502   BILIRUBINUR NEGATIVE 03/30/2016 1502   KETONESUR NEGATIVE 03/30/2016 1502   PROTEINUR 30* 03/30/2016 1502   UROBILINOGEN 0.2 04/02/2015 1900   NITRITE NEGATIVE 03/30/2016 1502   LEUKOCYTESUR TRACE* 03/30/2016 1502   Sepsis Labs: @LABRCNTIP (procalcitonin:4,lacticidven:4)  ) Recent Results (from the past 240 hour(s))  Blood Culture (routine x 2)     Status: None (Preliminary result)   Collection Time: 03/30/16  9:05 AM  Result Value Ref Range Status   Specimen Description BLOOD RIGHT ARM  Final   Special Requests BOTTLES DRAWN AEROBIC AND ANAEROBIC 8CC EACH  Final   Culture NO GROWTH < 12 HOURS  Final   Report Status PENDING  Incomplete  Blood Culture (routine x 2)     Status: None (Preliminary result)   Collection Time: 03/30/16  9:05 AM  Result Value Ref Range Status   Specimen Description BLOOD LEFT ARM  Final   Special Requests BOTTLES DRAWN AEROBIC AND ANAEROBIC 8 CC EACH  Final   Culture NO GROWTH < 12 HOURS  Final   Report Status PENDING  Incomplete  MRSA PCR Screening     Status: None   Collection Time: 03/30/16  2:53 PM  Result Value Ref Range Status   MRSA by PCR NEGATIVE NEGATIVE Final    Comment:         The GeneXpert MRSA Assay (FDA approved for NASAL specimens only), is one component of a comprehensive MRSA colonization surveillance program. It is not intended to diagnose MRSA infection nor to guide or monitor treatment for MRSA infections.          Radiology Studies: Dg Chest 2 View  03/30/2016  CLINICAL DATA:  Fever and altered mental status.  Hypoxia. EXAM: CHEST  2 VIEW COMPARISON:  Two view chest x-ray 12/05/2015 and thoracic spine radiographs 12/13/2015. FINDINGS: The heart is mildly enlarged. There is no edema or effusion to suggest failure. Severe emphysema is again noted. Right upper lobe pneumonia is present. This involves a larger area than on the previous chest x-ray. Degenerative changes of the thoracic spine are stable. No acute osseous abnormality is present. Atherosclerotic calcifications are present in the aorta. Marked degenerative changes of the  shoulders bilaterally are worse on the right. IMPRESSION: 1. Right upper lobe pneumonia. 2. Severe emphysema. 3. Mild cardiomegaly without failure. 4. Atherosclerosis of the thoracic aorta. Electronically Signed   By: San Morelle M.D.   On: 03/30/2016 09:56        Scheduled Meds: . aspirin  81 mg Oral Daily  . ceFEPime (MAXIPIME) IV  1 g Intravenous Q24H  . dextromethorphan-guaiFENesin  1 tablet Oral BID  . enoxaparin (LOVENOX) injection  30 mg Subcutaneous Q24H  . latanoprost  1 drop Both Eyes QHS  . mirtazapine  7.5 mg Oral QHS  . potassium chloride  10 mEq Oral Daily  . vancomycin  750 mg Intravenous Q24H   Continuous Infusions: . diltiazem (CARDIZEM) infusion 10 mg/hr (03/31/16 0800)     LOS: 1 day    Time spent: 30 minutes. Greater than 50% of this time was spent in direct contact with the patient coordinating care.     Lelon Frohlich, MD [facility] Pager [phone]  If 7PM-7AM, please contact night-coverage [email or web address] Password [facility] 03/31/2016, 10:43 AM

## 2016-04-01 DIAGNOSIS — R0902 Hypoxemia: Secondary | ICD-10-CM

## 2016-04-01 LAB — URINE CULTURE: CULTURE: NO GROWTH

## 2016-04-01 NOTE — Plan of Care (Signed)
Problem: Activity: Goal: Ability to tolerate increased activity will improve Outcome: Progressing oob in recliner today for 3-4hrs  Problem: Education: Goal: Knowledge of disease or condition will improve Outcome: Completed/Met Date Met:  04/01/16 Daughter wendy has good understanding of dx and current tx

## 2016-04-01 NOTE — Evaluation (Signed)
Clinical/Bedside Swallow Evaluation Patient Details  Name: [name] MRN: [id number] Date of Birth: 03/07/1926  Today's Date: 04/01/2016 Time: SLP Start Time (ACUTE ONLY): 2048 SLP Stop Time (ACUTE ONLY): 2109 SLP Time Calculation (min) (ACUTE ONLY): 21 min  Past Medical History:  Past Medical History  Diagnosis Date  . Clostridium difficile colitis     dating back to 2004  . Glaucoma   . Diverticulosis   . Diverticulitis   . Recurrent UTI   . Chronic constipation   . Nausea   . Dementia   . Hypertension   . Murmur, cardiac   . Gait instability     Uses [place] or cane toambulate   Past Surgical History:  Past Surgical History  Procedure Laterality Date  . Orif right hip  2004    Dr.Harrison  . Bilateral eyelid surgery    . Colonoscopy  10/30/04    NUR  . Vulvectomy N/A 06/14/2014    Procedure: PARTIAL MODIFED ANTERIOR RADICAL VULVECTOMY ;  Surgeon: Everitt Amber, MD;  Location: [facility];  Service: Gynecology;  Laterality: N/A;   HPI:  82 year old woman admitted on 6/30 from her SNF due to complaints of shortness of breath. She was found to have a fever, was desaturating into the 70s with increased work of breathing. Was found to have a pneumonia on chest x-ray and was also found to be in A. fib with RVR and admission was requested.   Assessment / Plan / Recommendation Clinical Impression  Ms. Mccabe was seen at bedside in ICU. MBSS was completed about a year ago with recommendation for D3/thin and puree meat (pt with flash penetration of thin at the time). She has been on this diet at [facility], but intake is minimal per report (she does well with soups). Pt agreeable to assessment, but states, "something is wrong with me.. I am so cold". SLP provided warm blanket and turned the heat up in room, RN aware. Pt with mild wheeze prior to po and evident after as well. Trials were limited as pt stated that she could not take anymore. No overt coughing or difficulty with po. Given history  of dysphagia and recurrent PNA, recommend D1/puree with thin liquids with SLP to follow for diet tolerance and upgrades as appropriate.     Aspiration Risk  Mild aspiration risk    Diet Recommendation Dysphagia 1 (Puree);Thin liquid   Liquid Administration via: Cup;Straw Medication Administration: Whole meds with liquid Supervision: Staff to assist with self feeding Compensations: Multiple dry swallows after each bite/sip;Effortful swallow;Clear throat intermittently Postural Changes: Seated upright at 90 degrees;Remain upright for at least 30 minutes after po intake    Other  Recommendations Oral Care Recommendations: Oral care BID Other Recommendations: Clarify dietary restrictions   Follow up Recommendations  Skilled Nursing facility    Frequency and Duration min 2x/week  1 week       Prognosis Prognosis for Safe Diet Advancement: Fair      Swallow Study   General Date of Onset: 03/30/16 HPI: 82 year old woman admitted on 6/30 from her SNF due to complaints of shortness of breath. She was found to have a fever, was desaturating into the 70s with increased work of breathing. Was found to have a pneumonia on chest x-ray and was also found to be in A. fib with RVR and admission was requested. Type of Study: Bedside Swallow Evaluation Previous Swallow Assessment: July 2016 MBSS D3/thin; puree meat Diet Prior to this Study: Regular;Thin liquids Temperature Spikes Noted:  No Respiratory Status: Nasal cannula History of Recent Intubation: No Behavior/Cognition: Alert;Cooperative Oral Cavity Assessment: Within Functional Limits Oral Care Completed by SLP: No Vision: Functional for self-feeding Self-Feeding Abilities: Needs assist Patient Positioning: Upright in bed Baseline Vocal Quality: Normal Volitional Cough: Weak Volitional Swallow: Able to elicit    Oral/Motor/Sensory Function Overall Oral Motor/Sensory Function: Within functional limits   Ice Chips Ice chips: Not  tested   Thin Liquid Thin Liquid: Within functional limits Presentation: Straw Other Comments:  (? mild wheeze)    Nectar Thick Nectar Thick Liquid: Not tested   Honey Thick Honey Thick Liquid: Within functional limits   Puree Puree: Within functional limits Presentation: Spoon   Solid   Thank you,  Genene Churn, CCC-SLP [phone]    Solid: Not tested Presentation:  (pt refused)        PORTER,DABNEY 04/01/2016,9:23 PM

## 2016-04-01 NOTE — Progress Notes (Signed)
PROGRESS NOTE    [name]  [id number] DOB: 1926/01/19 DOA: 03/30/2016 PCP: Unice Cobble, MD     Brief Narrative:  82 year old woman admitted on 6/30 from her SNF due to complaints of shortness of breath. She was found to have a fever, was desaturating into the 70s with increased work of breathing. Was found to have a pneumonia on chest x-ray and was also found to be in A. fib with RVR and admission was requested.   Assessment & Plan:   Principal Problem:   Hypoxia Active Problems:   Elevated blood-pressure reading without diagnosis of hypertension   Atrial fibrillation with RVR (HCC)   Healthcare-associated pneumonia   HCAP (healthcare-associated pneumonia)   Hospital acquired/aspiration pneumonia/acute hypoxemic respiratory failure -Strep pneumo urine antigen negative, rest of culture data negative to date. -Continue vancomycin and cefepime. -We'll request a speech therapy evaluation given concerns for recurrent aspiration. -We'll also request a palliative care evaluation given her poor state even at baseline.  Atrial fibrillation with rapid ventricular response -Remains on Cardizem drip at 7.5 mg per hour with rates in the 110s. -Plan to continue Cardizem drip today until rates are better controlled. -Believe she would be a high risk for anticoagulation due to her advanced age, dementia and history of falls. -Continue aspirin only.  Hypertension -Currently controlled on Cardizem drip.  Dementia -No behavioral disturbances   DVT prophylaxis: Lovenox Code Status: DO NOT RESUSCITATE Family Communication: Patient only Disposition Plan: Keep in ICU; will request palliative care consultation given advanced age, advanced COPD at baseline, and increased work of breathing and acute conditions.  Consultants:   None  Procedures:   None  Antimicrobials:   Vancomycin  Cefepime    Subjective: Lying in bed, not very interactive, unsure of her  baseline  Objective: Filed Vitals:   04/01/16 0800 04/01/16 0830 04/01/16 0900 04/01/16 0930  BP: 131/74 120/63 105/56   Pulse: 109 113 99 118  Temp:      TempSrc:      Resp: 23 23 18 24   Height:      Weight:      SpO2: 90% 92% 93% 97%    Intake/Output Summary (Last 24 hours) at 04/01/16 1030 Last data filed at 04/01/16 1000  Gross per 24 hour  Intake    860 ml  Output    450 ml  Net    410 ml   Filed Weights   03/30/16 1500 03/31/16 0400 04/01/16 0400  Weight: 48.2 kg (106 lb 4.2 oz) 46.3 kg (102 lb 1.2 oz) 46.5 kg (102 lb 8.2 oz)    Examination:  General exam: Awake Respiratory system: Increased work of breathing, coarse bilateral breath sounds, no wheezes Cardiovascular system: Tachycardic, irregular rhythm Gastrointestinal system: Abdomen is nondistended, soft and nontender. No organomegaly or masses felt. Normal bowel sounds heard. Central nervous system: Alert and oriented. No focal neurological deficits. Extremities: No C/C/E, +pedal pulses Skin: No rashes, lesions or ulcers Psychiatry: Unable to fully assess given current mental state    Data Reviewed: I have personally reviewed following labs and imaging studies  CBC:  Recent Labs Lab 03/30/16 0900 03/31/16 0432  WBC 16.1* 16.6*  NEUTROABS 14.4*  --   HGB 13.1 12.1  HCT 40.5 39.6  MCV 89.0 92.1  PLT 163 [id number]   Basic Metabolic Panel:  Recent Labs Lab 03/30/16 0900 03/31/16 0432  NA 135 137  K 4.2 4.3  CL 96* 99*  CO2 30 28  GLUCOSE 134* 150*  BUN  30* 26*  CREATININE 0.65 0.52  CALCIUM 8.8* 8.6*   GFR: Estimated Creatinine Clearance: 36.4 mL/min (by C-G formula based on Cr of 0.52). Liver Function Tests:  Recent Labs Lab 03/30/16 0900 03/31/16 0432  AST 20 21  ALT 18 18  ALKPHOS 78 98  BILITOT 1.0 0.6  PROT 7.2 6.5  ALBUMIN 3.9 3.4*   No results for input(s): LIPASE, AMYLASE in the last 168 hours. No results for input(s): AMMONIA in the last 168 hours. Coagulation  Profile: No results for input(s): INR, PROTIME in the last 168 hours. Cardiac Enzymes: No results for input(s): CKTOTAL, CKMB, CKMBINDEX, TROPONINI in the last 168 hours. BNP (last 3 results) No results for input(s): PROBNP in the last 8760 hours. HbA1C: No results for input(s): HGBA1C in the last 72 hours. CBG: No results for input(s): GLUCAP in the last 168 hours. Lipid Profile: No results for input(s): CHOL, HDL, LDLCALC, TRIG, CHOLHDL, LDLDIRECT in the last 72 hours. Thyroid Function Tests: No results for input(s): TSH, T4TOTAL, FREET4, T3FREE, THYROIDAB in the last 72 hours. Anemia Panel: No results for input(s): VITAMINB12, FOLATE, FERRITIN, TIBC, IRON, RETICCTPCT in the last 72 hours. Urine analysis:    Component Value Date/Time   COLORURINE YELLOW 03/30/2016 1502   APPEARANCEUR CLEAR 03/30/2016 1502   LABSPEC 1.010 03/30/2016 1502   PHURINE 6.0 03/30/2016 1502   GLUCOSEU NEGATIVE 03/30/2016 1502   HGBUR LARGE* 03/30/2016 1502   BILIRUBINUR NEGATIVE 03/30/2016 1502   KETONESUR NEGATIVE 03/30/2016 1502   PROTEINUR 30* 03/30/2016 1502   UROBILINOGEN 0.2 04/02/2015 1900   NITRITE NEGATIVE 03/30/2016 1502   LEUKOCYTESUR TRACE* 03/30/2016 1502   Sepsis Labs: @LABRCNTIP (procalcitonin:4,lacticidven:4)  ) Recent Results (from the past 240 hour(s))  Blood Culture (routine x 2)     Status: None (Preliminary result)   Collection Time: 03/30/16  9:05 AM  Result Value Ref Range Status   Specimen Description BLOOD RIGHT ARM  Final   Special Requests BOTTLES DRAWN AEROBIC AND ANAEROBIC 8CC EACH  Final   Culture NO GROWTH 2 DAYS  Final   Report Status PENDING  Incomplete  Blood Culture (routine x 2)     Status: None (Preliminary result)   Collection Time: 03/30/16  9:05 AM  Result Value Ref Range Status   Specimen Description BLOOD LEFT ARM  Final   Special Requests BOTTLES DRAWN AEROBIC AND ANAEROBIC 8 CC EACH  Final   Culture NO GROWTH 2 DAYS  Final   Report Status PENDING   Incomplete  MRSA PCR Screening     Status: None   Collection Time: 03/30/16  2:53 PM  Result Value Ref Range Status   MRSA by PCR NEGATIVE NEGATIVE Final    Comment:        The GeneXpert MRSA Assay (FDA approved for NASAL specimens only), is one component of a comprehensive MRSA colonization surveillance program. It is not intended to diagnose MRSA infection nor to guide or monitor treatment for MRSA infections.   Urine culture     Status: None   Collection Time: 03/30/16  3:02 PM  Result Value Ref Range Status   Specimen Description URINE, CLEAN CATCH  Final   Special Requests NONE  Final   Culture NO GROWTH Performed at [facility]   Final   Report Status 04/01/2016 FINAL  Final         Radiology Studies: No results found.      Scheduled Meds: . aspirin  81 mg Oral Daily  . ceFEPime (MAXIPIME)  IV  1 g Intravenous Q24H  . dextromethorphan-guaiFENesin  1 tablet Oral BID  . enoxaparin (LOVENOX) injection  30 mg Subcutaneous Q24H  . latanoprost  1 drop Both Eyes QHS  . mirtazapine  7.5 mg Oral QHS  . potassium chloride  10 mEq Oral Daily  . vancomycin  750 mg Intravenous Q24H   Continuous Infusions: . diltiazem (CARDIZEM) infusion 7.5 mg/hr (04/01/16 1000)     LOS: 2 days    Time spent: 30 minutes. Greater than 50% of this time was spent in direct contact with the patient coordinating care.     Lelon Frohlich, MD [facility] Pager [phone]  If 7PM-7AM, please contact night-coverage [email or web address] Password [email or web address] 04/01/2016, 10:30 AM

## 2016-04-02 ENCOUNTER — Encounter (HOSPITAL_COMMUNITY): Payer: Self-pay | Admitting: Primary Care

## 2016-04-02 DIAGNOSIS — Z515 Encounter for palliative care: Secondary | ICD-10-CM | POA: Insufficient documentation

## 2016-04-02 DIAGNOSIS — Z7189 Other specified counseling: Secondary | ICD-10-CM

## 2016-04-02 LAB — MAGNESIUM: Magnesium: 2.4 mg/dL (ref 1.7–2.4)

## 2016-04-02 LAB — LEGIONELLA PNEUMOPHILA SEROGP 1 UR AG: L. PNEUMOPHILA SEROGP 1 UR AG: NEGATIVE

## 2016-04-02 LAB — TSH: TSH: 1.49 u[IU]/mL (ref 0.350–4.500)

## 2016-04-02 LAB — VANCOMYCIN, TROUGH: Vancomycin Tr: 10 ug/mL — ABNORMAL LOW (ref 15–20)

## 2016-04-02 MED ORDER — MORPHINE SULFATE (CONCENTRATE) 10 MG/0.5ML PO SOLN
2.5000 mg | ORAL | Status: DC
  Administered 2016-04-02: 2.6 mg via SUBLINGUAL
  Filled 2016-04-02: qty 0.5

## 2016-04-02 MED ORDER — POLYVINYL ALCOHOL 1.4 % OP SOLN
1.0000 [drp] | Freq: Four times a day (QID) | OPHTHALMIC | Status: DC | PRN
  Filled 2016-04-02: qty 15

## 2016-04-02 MED ORDER — BISACODYL 10 MG RE SUPP: 10.0000 mg | Freq: Every day | RECTAL | Status: AC | PRN

## 2016-04-02 MED ORDER — MORPHINE SULFATE (CONCENTRATE) 10 MG/0.5ML PO SOLN: 2.5000 mg | ORAL | Status: AC

## 2016-04-02 MED ORDER — MORPHINE SULFATE (CONCENTRATE) 10 MG/0.5ML PO SOLN: 2.5000 mg | ORAL | Status: DC | PRN

## 2016-04-02 MED ORDER — BISACODYL 10 MG RE SUPP: 10.0000 mg | Freq: Every day | RECTAL | Status: DC | PRN

## 2016-04-02 MED ORDER — MORPHINE SULFATE (CONCENTRATE) 10 MG/0.5ML PO SOLN: 2.5000 mg | ORAL | Status: AC | PRN

## 2016-04-02 MED ORDER — METOPROLOL TARTRATE 25 MG/10 ML ORAL SUSPENSION
25.0000 mg | ORAL | Status: DC | PRN
  Filled 2016-04-02: qty 10

## 2016-04-02 MED ORDER — SODIUM CHLORIDE 0.9 % IV SOLN
1.5000 g | Freq: Three times a day (TID) | INTRAVENOUS | Status: DC
  Filled 2016-04-02 (×4): qty 1.5

## 2016-04-02 MED ORDER — BIOTENE DRY MOUTH MT LIQD: 15.0000 mL | OROMUCOSAL | Status: DC | PRN

## 2016-04-02 NOTE — Progress Notes (Signed)
SLP Cancellation Note  Patient Details Name: [name] MRN: [id number] DOB: [birth year]   Cancelled treatment:       Reason Eval/Treat Not Completed: Medical issues which prohibited therapy; Per palliative medicine family wishes to pursue comfort measures without intervention for swallowing. ST to sign off   Arvil Chaco MA, [place] Pathologist    [name] 04/06/2016, 3:27 PM

## 2016-04-02 NOTE — Care Management Note (Signed)
Case Management Note  Patient Details  Name: [name] MRN: [id number] Date of Birth: 03/26/1926  Subjective/Objective:                  Pt admitted with pneumonia and a-fib. Pt is from [facility]. Anticipate return to SNF at [place] is aware and will make arrangements for return to facility at discharge.   Action/Plan: No CM needs.   Expected Discharge Date:    04/05/2016              Expected Discharge Plan:  Skilled Nursing Facility  In-House Referral:  Clinical Social Work  Discharge planning Services  CM Consult  Post Acute Care Choice:  NA Choice offered to:  NA  DME Arranged:    DME Agency:     HH Arranged:    [place] Agency:     Status of Service:  Completed, signed off  If discussed at [employer], dates discussed:    Additional Comments:  Sherald Barge, RN 04/01/2016, 11:29 AM

## 2016-04-02 NOTE — Clinical Social Work Note (Signed)
Clinical Social Work Assessment  Patient Details  Name: [name] MRN: [id number] Date of Birth: 07-03-1926  Date of referral:  04/01/2016               Reason for consult:  Other (Comment Required) (From [facility])                Permission sought to share information with:    Permission granted to share information::     Name::        Agency::     Relationship::     Contact Information:     Housing/Transportation Living arrangements for the past 2 months:  [place] of Information:  Facility (Attempted contact with Daughter, Conni Slipper, however recevied no answer.) Patient Interpreter Needed:  None Criminal Activity/Legal Involvement Pertinent to Current Situation/Hospitalization:  No - Comment as needed Significant Relationships:  Adult Children Lives with:  Facility Resident Do you feel safe going back to the place where you live?  Yes Need for family participation in patient care:  Yes (Comment)  Care giving concerns:  [place] resident.    Social Worker assessment / plan:  CSW spoke with Keri at [facility] Keri advised that patient was typically oriented only to herself, however she knows her daughter. She stated that patient uses a wheelchair and participates with restorative nursing every other day. Keri advised that patient feeds herself and enjoys adult coloring books. She indicated that patient's daughter visits daily.  She advised that patient would not need at new [facility] unless she came back with PT orders.  CSW contacted patient's daughter, Conni Slipper, and left a message requesting return contact.   Employment status:  Retired Forensic scientist:  Medicare PT Recommendations:  Not assessed at this time Information / Referral to community resources:     Patient/Family's Response to care: Patient has been a resident at [facility] for over a year.  Patient/Family's Understanding of and Emotional Response to Diagnosis, Current Treatment, and Prognosis:  Patient's  family is very involved in her care and understands her diagnosis, treatment and prognosis.   Emotional Assessment Appearance:  Appears stated age Attitude/Demeanor/Rapport:  Unable to Assess Affect (typically observed):  Unable to Assess Orientation:  Oriented to Self Alcohol / Substance use:  Not Applicable Psych involvement (Current and /or in the community):  No (Comment)  Discharge Needs  Concerns to be addressed:  No discharge needs identified Readmission within the last 30 days:  No Current discharge risk:  None Barriers to Discharge:  No Barriers Identified   Ihor Gully, LCSW 04/04/2016, 2:23 PM

## 2016-04-02 NOTE — Discharge Summary (Signed)
Physician Discharge Summary  [name] [id number] DOB: [birth year] DOA: 03/30/2016  PCP: Unice Cobble, MD  Admit date: 03/30/2016 Discharge date: 04/20/2016  Time spent: 45 minutes  Recommendations for Outpatient Follow-up:  -Will be discharged to SNF today with hospice services. -Patient's daughter does not want aggressive measures, comfort care only.   Discharge Diagnoses:  Principal Problem:   Hypoxia Active Problems:   Elevated blood-pressure reading without diagnosis of hypertension   Atrial fibrillation with RVR (HCC)   Healthcare-associated pneumonia   HCAP (healthcare-associated pneumonia)   Discharge Condition: Guarded  Filed Weights   03/31/16 0400 04/01/16 0400 04/24/2016 0430  Weight: 46.3 kg (102 lb 1.2 oz) 46.5 kg (102 lb 8.2 oz) 48.9 kg (107 lb 12.9 oz)    History of present illness:  As per Dr. Leonie Douglas on 6/30: 82 year old female with history of chronic constipation, dementia, living in [facility] memory care unit, history of fluttering in her chest, previously seen by Dr. Domenic Polite cardiology for arrhythmia, grade 1 diastolic heart failure with mild mitral regurgitation, who presents to the ER today from [facility] center with fever, desaturation into the 70s, increased work of breathing. Patient has no recollection of the events and most of the history is obtained from the caregiver. Caregiver noted yesterday that the patient's heart rate was ranging from 35-135. Her blood pressure was also low in into the 80s.chest x-ray today shows right upper lobe pneumonia. Patient found to be in atrial fibrillation with rapid ventricular response with heart rate in the [facility] acquired/aspiration pneumonia/acute hypoxemic respiratory failure -Strep pneumo urine antigen negative, rest of culture data negative to date. -Seen by ST with recs for DI diet with thin liquids. -With worsening condition, was seen in consultation by palliative care  medicine, daughter wants to focus on comfort only, antibiotics and other medications have been discontinued.  Atrial fibrillation with rapid ventricular response -Focus on comfort only.  Dementia -No behavioral disturbances  Procedures:  None   Consultations:  Cardiology  Palliative care  Discharge Instructions     Medication List    STOP taking these medications        aspirin 81 MG tablet     furosemide 20 MG tablet  Commonly known as:  LASIX     ipratropium-albuterol 0.5-2.5 (3) MG/3ML Soln  Commonly known as:  DUONEB     mirtazapine 15 MG tablet  Commonly known as:  REMERON     multivitamin with minerals tablet     potassium chloride 10 MEQ CR capsule  Commonly known as:  MICRO-K     PROSTAT PO     traMADol 50 MG tablet  Commonly known as:  ULTRAM      TAKE these medications        acetaminophen 500 MG tablet  Commonly known as:  TYLENOL  Take one tablet by mouth twice daily     ALPRAZolam 0.25 MG tablet  Commonly known as:  XANAX  Take one tablet by mouth every 4 hours as needed for nerves     bisacodyl 10 MG suppository  Commonly known as:  DULCOLAX  Place 1 suppository (10 mg total) rectally daily as needed for moderate constipation.     ENSURE  Take 237 mLs by mouth 2 (two) times daily between meals. Take by mouth twice daily between meals     latanoprost 0.005 % ophthalmic solution  Commonly known as:  XALATAN  Place 1 drop into both eyes at  bedtime.     morphine CONCENTRATE 10 MG/0.5ML Soln concentrated solution  Place 0.13 mLs (2.6 mg total) under the tongue every 4 (four) hours.     morphine CONCENTRATE 10 MG/0.5ML Soln concentrated solution  Place 0.13 mLs (2.6 mg total) under the tongue every 30 (thirty) minutes as needed for moderate pain or severe pain (increased work of breathing).     OXYGEN  May give oxygen at 2 l/min via nasal cannula as needed       Allergies  Allergen Reactions  . Flagyl [Metronidazole Hcl]   .  Other     Most antibiotics causes severe c. Difficille.  Nuts and any food containing seeds  . Ciprofloxacin Rash      The results of significant diagnostics from this hospitalization (including imaging, microbiology, ancillary and laboratory) are listed below for reference.    Significant Diagnostic Studies: Dg Chest 2 View  03/30/2016  CLINICAL DATA:  Fever and altered mental status.  Hypoxia. EXAM: CHEST  2 VIEW COMPARISON:  Two view chest x-ray 12/05/2015 and thoracic spine radiographs 12/13/2015. FINDINGS: The heart is mildly enlarged. There is no edema or effusion to suggest failure. Severe emphysema is again noted. Right upper lobe pneumonia is present. This involves a larger area than on the previous chest x-ray. Degenerative changes of the thoracic spine are stable. No acute osseous abnormality is present. Atherosclerotic calcifications are present in the aorta. Marked degenerative changes of the shoulders bilaterally are worse on the right. IMPRESSION: 1. Right upper lobe pneumonia. 2. Severe emphysema. 3. Mild cardiomegaly without failure. 4. Atherosclerosis of the thoracic aorta. Electronically Signed   By: San Morelle M.D.   On: 03/30/2016 09:56    Microbiology: Recent Results (from the past 240 hour(s))  Blood Culture (routine x 2)     Status: None (Preliminary result)   Collection Time: 03/30/16  9:05 AM  Result Value Ref Range Status   Specimen Description BLOOD RIGHT ARM  Final   Special Requests BOTTLES DRAWN AEROBIC AND ANAEROBIC 8CC EACH  Final   Culture NO GROWTH 3 DAYS  Final   Report Status PENDING  Incomplete  Blood Culture (routine x 2)     Status: None (Preliminary result)   Collection Time: 03/30/16  9:05 AM  Result Value Ref Range Status   Specimen Description BLOOD LEFT ARM  Final   Special Requests BOTTLES DRAWN AEROBIC AND ANAEROBIC 8 CC EACH  Final   Culture NO GROWTH 3 DAYS  Final   Report Status PENDING  Incomplete  MRSA PCR Screening      Status: None   Collection Time: 03/30/16  2:53 PM  Result Value Ref Range Status   MRSA by PCR NEGATIVE NEGATIVE Final    Comment:        The GeneXpert MRSA Assay (FDA approved for NASAL specimens only), is one component of a comprehensive MRSA colonization surveillance program. It is not intended to diagnose MRSA infection nor to guide or monitor treatment for MRSA infections.   Urine culture     Status: None   Collection Time: 03/30/16  3:02 PM  Result Value Ref Range Status   Specimen Description URINE, CLEAN CATCH  Final   Special Requests NONE  Final   Culture NO GROWTH Performed at [facility]   Final   Report Status 04/01/2016 FINAL  Final     Labs: Basic Metabolic Panel:  Recent Labs Lab 03/30/16 0900 03/31/16 0432 04/07/2016 0739  NA 135 137  --  K 4.2 4.3  --   CL 96* 99*  --   CO2 30 28  --   GLUCOSE 134* 150*  --   BUN 30* 26*  --   CREATININE 0.65 0.52  --   CALCIUM 8.8* 8.6*  --   MG  --   --  2.4   Liver Function Tests:  Recent Labs Lab 03/30/16 0900 03/31/16 0432  AST 20 21  ALT 18 18  ALKPHOS 78 98  BILITOT 1.0 0.6  PROT 7.2 6.5  ALBUMIN 3.9 3.4*   No results for input(s): LIPASE, AMYLASE in the last 168 hours. No results for input(s): AMMONIA in the last 168 hours. CBC:  Recent Labs Lab 03/30/16 0900 03/31/16 0432  WBC 16.1* 16.6*  NEUTROABS 14.4*  --   HGB 13.1 12.1  HCT 40.5 39.6  MCV 89.0 92.1  PLT 163 192   Cardiac Enzymes: No results for input(s): CKTOTAL, CKMB, CKMBINDEX, TROPONINI in the last 168 hours. BNP: BNP (last 3 results)  Recent Labs  09/17/15 0620  BNP 300.0*    ProBNP (last 3 results) No results for input(s): PROBNP in the last 8760 hours.  CBG: No results for input(s): GLUCAP in the last 168 hours.     SignedLelon Frohlich  [facility] Pager: [phone] 03/31/2016, 4:21 PM

## 2016-04-02 NOTE — Care Management Important Message (Signed)
Important Message  Patient Details  Name: ARIADNA BECTON MRN: [id number] Date of Birth: Feb 06, 1926   Medicare Important Message Given:  Yes    Sherald Barge, RN 04/13/2016, 11:28 AM

## 2016-04-02 NOTE — Consult Note (Signed)
Consultation Note Date: 04/28/2016   Patient Name: [name]  DOB: 1926/05/22  MRN: [id number]  Age / Sex: 82 y.o., female  PCP: Hendricks Limes, MD Referring Physician: Koleen Nimrod Acost*  Reason for Consultation: Disposition, Establishing goals of care, Hospice Evaluation, Non pain symptom management, Psychosocial/spiritual support and Withdrawal of life-sustaining treatment  HPI/Patient Profile: 82 y.o. female  with past medical history of dementia, C diff colitis, diverticulosis, recurrent UTI admitted on 03/30/2016 with right upper lobe pneumonia and a fib with rapid ventricular response.   Clinical Assessment and Goals of Care: [name] is resting quietly in bed, she has a non-rebreather on, but still using accessory muscles to breathe. At bedside is daughter/HCPOA [name].  We talk about [name]'s quality of life, and her chronic and acute illnesses. [name] shares that her brother [name] shared with her yesterday, "it's better to just let her go on". We talk about the concepts of comfort care, do not treat the infection, let nature take its course. [name] shares that if the [name] were to take her mother now, she feels that her mother has lived a pretty good life. We talk about the use of morphine for symptom management, breathing, not pain. [name] asks if the use of morphine would prolonged her mothers suffering, I share that morphine would reduce her suffering. [name] talks about [name]'s possibility to improve, and I share a diagram of the chronic illness trajectory.  [name]'s decides to focus on quality and comfort at this time. And is agreeable to have [name] returned to [name] with comfort measures.  I will continue to follow [name] for symptom management.  HCPOA- daughter/HCPOA [name].   SUMMARY OF RECOMMENDATIONS   full comfort care, allow a natural  death, let nature take its course.  Code Status/Advance Care Planning:  DNR  Symptom Management:   per hospitalist,  morphine 2.5 mg sublingual Q4 hours scheduled, may hold for respiratory rate of 12 or less  morphine 2.5 mg sublingual Q1 hour PRN, please give for respiratory rate of 22 or greater.  Palliative Prophylaxis:   Aspiration, Bowel Regimen, Frequent Pain Assessment, Oral Care and Turn Reposition  Additional Recommendations (Limitations, Scope, Preferences):  Full Comfort Care  Psycho-social/Spiritual:   Desire for further Chaplaincy support:no  Additional Recommendations: Caregiving  Support/Resources, Education on Hospice and Grief/Bereavement Support  Prognosis:   < 2 weeks, likely based on pneumonia, and families desire to focus on comfort only, allowing a natural death.   Discharge Planning: Return to [name] with full comfort care, palliative medicine to follow.      Primary Diagnoses: Present on Admission:  . Hypoxia . Healthcare-associated pneumonia . Atrial fibrillation with RVR ([place]) . Elevated blood-pressure reading without diagnosis of hypertension . HCAP (healthcare-associated pneumonia)  I have reviewed the medical record, interviewed the patient and family, and examined the patient. The following aspects are pertinent.  Past Medical History  Diagnosis Date  . Clostridium difficile colitis     dating back  to 2004  . Glaucoma   . Diverticulosis   . Diverticulitis   . Recurrent UTI   . Chronic constipation   . Nausea   . Dementia   . Hypertension   . Murmur, cardiac   . Gait instability     Uses [place] or cane toambulate   Social History   Social History  . Marital Status: Widowed    Spouse Name: N/A  . Number of Children: N/A  . Years of Education: N/A   Occupational History  . Retired    Social History Main Topics  . Smoking status: Never Smoker   . Smokeless tobacco: Never Used  . Alcohol Use: No  . Drug Use: No  .  Sexual Activity: Not Currently   Other Topics Concern  . None   Social History Narrative   Family History  Problem Relation Age of Onset  . Diabetes type II     Scheduled Meds: . latanoprost  1 drop Both Eyes QHS  . morphine CONCENTRATE  2.6 mg Sublingual Q4H   Continuous Infusions:  PRN Meds:.ALPRAZolam, antiseptic oral rinse, bisacodyl, levalbuterol, metoprolol tartrate, morphine CONCENTRATE, polyvinyl alcohol Medications Prior to Admission:  Prior to Admission medications   Medication Sig Start Date End Date Taking? Authorizing Provider  acetaminophen (TYLENOL) 500 MG tablet Take one tablet by mouth twice daily   Yes Historical Provider, MD  ALPRAZolam (XANAX) 0.25 MG tablet Take one tablet by mouth every 4 hours as needed for nerves   Yes Historical Provider, MD  aspirin 81 MG tablet Take 81 mg by mouth daily.   Yes Historical Provider, MD  ENSURE (ENSURE) Take 237 mLs by mouth 2 (two) times daily between meals. Take by mouth twice daily between meals   Yes Historical Provider, MD  furosemide (LASIX) 20 MG tablet Take one tablet by mouth once daily   Yes Historical Provider, MD  ipratropium-albuterol (DUONEB) 0.5-2.5 (3) MG/3ML SOLN Take 3 mLs by nebulization every 6 (six) hours as needed. For cough,chest congestion, wheezing   Yes Historical Provider, MD  latanoprost (XALATAN) 0.005 % ophthalmic solution Place 1 drop into both eyes at bedtime.    Yes Historical Provider, MD  mirtazapine (REMERON) 15 MG tablet Take 7.5 mg by mouth at bedtime.   Yes Historical Provider, MD  Multiple Vitamins-Minerals (MULTIVITAMIN WITH MINERALS) tablet Take 1 tablet by mouth daily.     Yes Historical Provider, MD  Pollen Extracts (PROSTAT PO) Give 52ml by mouth twice daily to promote wound healing. Mix with Ensure Clear +vanilla Ice cream twice daily between meals   Yes Historical Provider, MD  potassium chloride (MICRO-K) 10 MEQ CR capsule Take 10 mEq by mouth 2 (two) times daily.   Yes Historical  Provider, MD  traMADol (ULTRAM) 50 MG tablet Take one tablet by mouth every 4 hours as needed for pain; Take one tablet by mouth once daily for back pain   Yes Historical Provider, MD  OXYGEN May give oxygen at 2 l/min via nasal cannula as needed    Historical Provider, MD   Allergies  Allergen Reactions  . Flagyl [Metronidazole Hcl]   . Other     Most antibiotics causes severe c. Difficille.  Nuts and any food containing seeds  . Ciprofloxacin Rash   Review of Systems  Unable to perform ROS: Acuity of condition    Physical Exam  Constitutional:  Marked work of breathing noted, frail and thin.   HENT:  Head: Normocephalic and atraumatic.  Cardiovascular:  Tacky  90 to one tens   Pulmonary/Chest:  Marked work of breathing noted  Abdominal: Soft. She exhibits distension. There is no guarding.  Neurological: She is alert.  Unable to determine orientation at this time, oriented to self per norm.  Skin:  Cool and clammy  Nursing note and vitals reviewed.   Vital Signs: BP 123/68 mmHg  Pulse 95  Temp(Src) 98.8 F (37.1 C) (Axillary)  Resp 24  Ht 5\' 6"  (1.676 m)  Wt 48.9 kg (107 lb 12.9 oz)  BMI 17.41 kg/m2  SpO2 98% Pain Assessment: No/denies pain   Pain Score: 0-No pain   SpO2: SpO2: 98 % O2 Device:SpO2: 98 % O2 Flow Rate: .O2 Flow Rate (L/min): 8 L/min  IO: Intake/output summary:  Intake/Output Summary (Last 24 hours) at 04/04/2016 1610 Last data filed at 04/08/2016 1554  Gross per 24 hour  Intake    567 ml  Output    300 ml  Net    267 ml    LBM: Last BM Date: 03/30/16 Baseline Weight: Weight: 43.999 kg (97 lb) Most recent weight: Weight: 48.9 kg (107 lb 12.9 oz)     Palliative Assessment/Data:    Time In: 1450 Time Out: 1550 Time Total: 60 Minutes Greater than 50%  of this time was spent counseling and coordinating care related to the above assessment and plan.  Signed by: Drue Novel, NP   Please contact Palliative Medicine Team phone at [phone]  for questions and concerns.  For individual provider: See Shea Evans

## 2016-04-02 NOTE — Progress Notes (Signed)
Pt discharged back to [facility] with belongings. Family aware. Report given to RN at [facility]. IV removed with no complications. DNR form and prescriptions given to accepting nurse. Son and RN at beside.

## 2016-04-02 NOTE — Progress Notes (Signed)
PROGRESS NOTE    [name]  [id number] DOB: May 31, 1926 DOA: 03/30/2016 PCP: Unice Cobble, MD     Brief Narrative:  82 year old woman admitted on 6/30 from her SNF due to complaints of shortness of breath. She was found to have a fever, was desaturating into the 70s with increased work of breathing. Was found to have a pneumonia on chest x-ray and was also found to be in A. fib with RVR and admission was requested.   Assessment & Plan:   Principal Problem:   Hypoxia Active Problems:   Elevated blood-pressure reading without diagnosis of hypertension   Atrial fibrillation with RVR (HCC)   Healthcare-associated pneumonia   HCAP (healthcare-associated pneumonia)   Hospital acquired/aspiration pneumonia/acute hypoxemic respiratory failure -Strep pneumo urine antigen negative, rest of culture data negative to date. -DC vancomycin given negative PCR, change abx to unasyn to better cover possibility of aspiration. -Seen by ST with recs for DI diet with thin liquids. -We'll also request a palliative care evaluation given her poor state even at baseline.  Atrial fibrillation with rapid ventricular response -Remains on Cardizem drip with rates in the 110s. -Plan to continue Cardizem drip today until rates are better controlled. -Believe she would be a high risk for anticoagulation due to her advanced age, dementia and history of falls. -Continue aspirin only.  Hypertension -Currently controlled on Cardizem drip.  Dementia -No behavioral disturbances   DVT prophylaxis: Lovenox Code Status: DO NOT RESUSCITATE Family Communication: Patient only Disposition Plan: Keep in ICU; will request palliative care consultation given advanced age, advanced COPD at baseline, and increased work of breathing and acute conditions.  Consultants:   None  Procedures:   None  Antimicrobials:   Vancomycin  Cefepime    Subjective: Lying in bed, not very interactive, unsure of  her baseline  Objective: Filed Vitals:   04/20/2016 0830 04/17/2016 0900 04/06/2016 0930 04/10/2016 1000  BP: 117/57 111/58 107/57 121/70  Pulse:      Temp:      TempSrc:      Resp: 27 21 21 24   Height:      Weight:      SpO2: 93% 91% 92% 95%    Intake/Output Summary (Last 24 hours) at 04/08/2016 1021 Last data filed at 04/06/2016 0840  Gross per 24 hour  Intake    685 ml  Output    350 ml  Net    335 ml   Filed Weights   03/31/16 0400 04/01/16 0400 04/15/2016 0430  Weight: 46.3 kg (102 lb 1.2 oz) 46.5 kg (102 lb 8.2 oz) 48.9 kg (107 lb 12.9 oz)    Examination:  General exam: Awake Respiratory system: Increased work of breathing, coarse bilateral breath sounds, no wheezes Cardiovascular system: Tachycardic, irregular rhythm Gastrointestinal system: Abdomen is nondistended, soft and nontender. No organomegaly or masses felt. Normal bowel sounds heard. Central nervous system: Alert and oriented. No focal neurological deficits. Extremities: No C/C/E, +pedal pulses Skin: No rashes, lesions or ulcers Psychiatry: Unable to fully assess given current mental state    Data Reviewed: I have personally reviewed following labs and imaging studies  CBC:  Recent Labs Lab 03/30/16 0900 03/31/16 0432  WBC 16.1* 16.6*  NEUTROABS 14.4*  --   HGB 13.1 12.1  HCT 40.5 39.6  MCV 89.0 92.1  PLT 163 [id number]   Basic Metabolic Panel:  Recent Labs Lab 03/30/16 0900 03/31/16 0432 04/24/2016 0739  NA 135 137  --   K 4.2 4.3  --  CL 96* 99*  --   CO2 30 28  --   GLUCOSE 134* 150*  --   BUN 30* 26*  --   CREATININE 0.65 0.52  --   CALCIUM 8.8* 8.6*  --   MG  --   --  2.4   GFR: Estimated Creatinine Clearance: 38.2 mL/min (by C-G formula based on Cr of 0.52). Liver Function Tests:  Recent Labs Lab 03/30/16 0900 03/31/16 0432  AST 20 21  ALT 18 18  ALKPHOS 78 98  BILITOT 1.0 0.6  PROT 7.2 6.5  ALBUMIN 3.9 3.4*   No results for input(s): LIPASE, AMYLASE in the last 168 hours. No  results for input(s): AMMONIA in the last 168 hours. Coagulation Profile: No results for input(s): INR, PROTIME in the last 168 hours. Cardiac Enzymes: No results for input(s): CKTOTAL, CKMB, CKMBINDEX, TROPONINI in the last 168 hours. BNP (last 3 results) No results for input(s): PROBNP in the last 8760 hours. HbA1C: No results for input(s): HGBA1C in the last 72 hours. CBG: No results for input(s): GLUCAP in the last 168 hours. Lipid Profile: No results for input(s): CHOL, HDL, LDLCALC, TRIG, CHOLHDL, LDLDIRECT in the last 72 hours. Thyroid Function Tests: No results for input(s): TSH, T4TOTAL, FREET4, T3FREE, THYROIDAB in the last 72 hours. Anemia Panel: No results for input(s): VITAMINB12, FOLATE, FERRITIN, TIBC, IRON, RETICCTPCT in the last 72 hours. Urine analysis:    Component Value Date/Time   COLORURINE YELLOW 03/30/2016 1502   APPEARANCEUR CLEAR 03/30/2016 1502   LABSPEC 1.010 03/30/2016 1502   PHURINE 6.0 03/30/2016 1502   GLUCOSEU NEGATIVE 03/30/2016 1502   HGBUR LARGE* 03/30/2016 1502   BILIRUBINUR NEGATIVE 03/30/2016 1502   KETONESUR NEGATIVE 03/30/2016 1502   PROTEINUR 30* 03/30/2016 1502   UROBILINOGEN 0.2 04/02/2015 1900   NITRITE NEGATIVE 03/30/2016 1502   LEUKOCYTESUR TRACE* 03/30/2016 1502   Sepsis Labs: @LABRCNTIP (procalcitonin:4,lacticidven:4)  ) Recent Results (from the past 240 hour(s))  Blood Culture (routine x 2)     Status: None (Preliminary result)   Collection Time: 03/30/16  9:05 AM  Result Value Ref Range Status   Specimen Description BLOOD RIGHT ARM  Final   Special Requests BOTTLES DRAWN AEROBIC AND ANAEROBIC 8CC EACH  Final   Culture NO GROWTH 2 DAYS  Final   Report Status PENDING  Incomplete  Blood Culture (routine x 2)     Status: None (Preliminary result)   Collection Time: 03/30/16  9:05 AM  Result Value Ref Range Status   Specimen Description BLOOD LEFT ARM  Final   Special Requests BOTTLES DRAWN AEROBIC AND ANAEROBIC 8 CC EACH   Final   Culture NO GROWTH 2 DAYS  Final   Report Status PENDING  Incomplete  MRSA PCR Screening     Status: None   Collection Time: 03/30/16  2:53 PM  Result Value Ref Range Status   MRSA by PCR NEGATIVE NEGATIVE Final    Comment:        The GeneXpert MRSA Assay (FDA approved for NASAL specimens only), is one component of a comprehensive MRSA colonization surveillance program. It is not intended to diagnose MRSA infection nor to guide or monitor treatment for MRSA infections.   Urine culture     Status: None   Collection Time: 03/30/16  3:02 PM  Result Value Ref Range Status   Specimen Description URINE, CLEAN CATCH  Final   Special Requests NONE  Final   Culture NO GROWTH Performed at [facility]  Final   Report Status 04/01/2016 FINAL  Final         Radiology Studies: No results found.      Scheduled Meds: . aspirin  81 mg Oral Daily  . ceFEPime (MAXIPIME) IV  1 g Intravenous Q24H  . dextromethorphan-guaiFENesin  1 tablet Oral BID  . enoxaparin (LOVENOX) injection  30 mg Subcutaneous Q24H  . latanoprost  1 drop Both Eyes QHS  . mirtazapine  7.5 mg Oral QHS  . potassium chloride  10 mEq Oral Daily  . vancomycin  750 mg Intravenous Q24H   Continuous Infusions: . diltiazem (CARDIZEM) infusion 5 mg/hr (04/26/2016 0800)     LOS: 3 days    Time spent: 30 minutes. Greater than 50% of this time was spent in direct contact with the patient coordinating care.     Lelon Frohlich, MD [facility] Pager [phone]  If 7PM-7AM, please contact night-coverage [email or web address] Password [email or web address] 04/07/2016, 10:21 AM

## 2016-04-02 NOTE — Progress Notes (Signed)
Pharmacy Antibiotic Note  [name] is a 82 y.o. female admitted on 03/30/2016 with pneumonia, suspected aspiration.  Pharmacy has been consulted for UNASYN dosing.  Plan: Unasyn 1.5gm IV q8hrs Monitor labs, renal fxn, progress and c/s  Height: 5\' 6"  (167.6 cm) Weight: 107 lb 12.9 oz (48.9 kg) IBW/kg (Calculated) : 59.3  Temp (24hrs), Avg:98 F (36.7 C), Min:97.8 F (36.6 C), Max:98.2 F (36.8 C)   Recent Labs Lab 03/30/16 0858 03/30/16 0900 03/31/16 0432 04/05/2016 0739  WBC  --  16.1* 16.6*  --   CREATININE  --  0.65 0.52  --   LATICACIDVEN 1.39  --   --   --   VANCOTROUGH  --   --   --  10*    Estimated Creatinine Clearance: 38.2 mL/min (by C-G formula based on Cr of 0.52).    Allergies  Allergen Reactions  . Flagyl [Metronidazole Hcl]   . Other     Most antibiotics causes severe c. Difficille.  Nuts and any food containing seeds  . Ciprofloxacin Rash   Antimicrobials this admission: Vancomycin 6/30 >>  Zosyn x 1 dose in ED 6/30 >>  Cefepime 6/30>>7/3 UNASYN 7/3 >>  Recent Results (from the past 240 hour(s))  Blood Culture (routine x 2)     Status: None (Preliminary result)   Collection Time: 03/30/16  9:05 AM  Result Value Ref Range Status   Specimen Description BLOOD RIGHT ARM  Final   Special Requests BOTTLES DRAWN AEROBIC AND ANAEROBIC 8CC EACH  Final   Culture NO GROWTH 3 DAYS  Final   Report Status PENDING  Incomplete  Blood Culture (routine x 2)     Status: None (Preliminary result)   Collection Time: 03/30/16  9:05 AM  Result Value Ref Range Status   Specimen Description BLOOD LEFT ARM  Final   Special Requests BOTTLES DRAWN AEROBIC AND ANAEROBIC 8 CC EACH  Final   Culture NO GROWTH 3 DAYS  Final   Report Status PENDING  Incomplete  MRSA PCR Screening     Status: None   Collection Time: 03/30/16  2:53 PM  Result Value Ref Range Status   MRSA by PCR NEGATIVE NEGATIVE Final    Comment:        The GeneXpert MRSA Assay (FDA approved for  NASAL specimens only), is one component of a comprehensive MRSA colonization surveillance program. It is not intended to diagnose MRSA infection nor to guide or monitor treatment for MRSA infections.   Urine culture     Status: None   Collection Time: 03/30/16  3:02 PM  Result Value Ref Range Status   Specimen Description URINE, CLEAN CATCH  Final   Special Requests NONE  Final   Culture NO GROWTH Performed at [facility]   Final   Report Status 04/01/2016 FINAL  Final   Thank you for allowing pharmacy to be a part of this patient's care.  Hart Robinsons, PharmD Clinical Pharmacist Pager:  [phone] 04/09/2016 1:23 PM

## 2016-04-02 NOTE — Clinical Social Work Note (Signed)
Pt d/c today back to [facility] with comfort care. Facility notified and agreeable. Pt's daughter also aware. Will transfer with staff. Out of facility DNR sent with pt.   Benay Pike, [place]

## 2016-04-02 NOTE — Consult Note (Addendum)
Primary cardiologist: Dr Rozann Lesches Consulting cardiologist: Dr Carlyle Dolly MD Requesting physician: Dr Lelon Frohlich Indication: Afib with RVR  Clinical Summary [name] is a 82 y.o.female history of dementia, HTN, DNR status admitted with SOB, fever, hypoxia. Admitted with pneumonia that is being managed by primary team, cardiology is consulted for new onset aflutter with RVR. Started on dilt gtt. Patient with severe dementia, unable to answer questions this AM.    WBC 16.1, Hgb 13.1, Plt 163,K 4.2, Cr 0.65 EKG narrow complex regular tachycardia, severe wandering baseline limites interpretation. Follow up EKG looks more consistent with MAT, incomplete RBBB, LAFB CXR RUL pneumonia, severe emphsyema       Allergies  Allergen Reactions  . Flagyl [Metronidazole Hcl]   . Other     Most antibiotics causes severe c. Difficille.  Nuts and any food containing seeds  . Ciprofloxacin Rash    Medications Scheduled Medications: . aspirin  81 mg Oral Daily  . ceFEPime (MAXIPIME) IV  1 g Intravenous Q24H  . dextromethorphan-guaiFENesin  1 tablet Oral BID  . enoxaparin (LOVENOX) injection  30 mg Subcutaneous Q24H  . latanoprost  1 drop Both Eyes QHS  . mirtazapine  7.5 mg Oral QHS  . potassium chloride  10 mEq Oral Daily  . vancomycin  750 mg Intravenous Q24H     Infusions: . diltiazem (CARDIZEM) infusion 5 mg/hr (04/01/2016 0800)     PRN Medications:  ALPRAZolam, levalbuterol   Past Medical History  Diagnosis Date  . Clostridium difficile colitis     dating back to 2004  . Glaucoma   . Diverticulosis   . Diverticulitis   . Recurrent UTI   . Chronic constipation   . Nausea   . Dementia   . Hypertension   . Murmur, cardiac   . Gait instability     Uses [place] or cane toambulate    Past Surgical History  Procedure Laterality Date  . Orif right hip  2004    Dr.Harrison  . Bilateral eyelid surgery    . Colonoscopy  10/30/04    NUR  .  Vulvectomy N/A 06/14/2014    Procedure: PARTIAL MODIFED ANTERIOR RADICAL VULVECTOMY ;  Surgeon: Everitt Amber, MD;  Location: [facility]S;  Service: Gynecology;  Laterality: N/A;    Family History  Problem Relation Age of Onset  . Diabetes type II      Social History [name] reports that she has never smoked. She has never used smokeless tobacco. [name] reports that she does not drink alcohol.  Review of Systems Unable to collect, patient with severe dementia and does not answer questions     Physical Examination Blood pressure 112/50, pulse 53, temperature 97.8 F (36.6 C), temperature source Oral, resp. rate 21, height 5\' 6"  (1.676 m), weight 107 lb 12.9 oz (48.9 kg), SpO2 93 %.  Intake/Output Summary (Last 24 hours) at 04/04/2016 0815 Last data filed at 04/12/2016 0800  Gross per 24 hour  Intake  672.5 ml  Output    350 ml  Net  322.5 ml    HEENT: sclera clear, throat clear  Cardiovascular: irreg, rate 100, no m/r/g, no jvd  Respiratory: CTAB  GI: abdomen soft, NT, ND  MSK: no LE edema  Neuro: no focal deficits  Psych: unable to assess, patient does not answer questions.    Lab Results  Basic Metabolic Panel:  Recent Labs Lab 03/30/16 0900 03/31/16 0432  NA 135 137  K 4.2 4.3  CL 96* 99*  CO2 30 28  GLUCOSE 134* 150*  BUN 30* 26*  CREATININE 0.65 0.52  CALCIUM 8.8* 8.6*    Liver Function Tests:  Recent Labs Lab 03/30/16 0900 03/31/16 0432  AST 20 21  ALT 18 18  ALKPHOS 78 98  BILITOT 1.0 0.6  PROT 7.2 6.5  ALBUMIN 3.9 3.4*    CBC:  Recent Labs Lab 03/30/16 0900 03/31/16 0432  WBC 16.1* 16.6*  NEUTROABS 14.4*  --   HGB 13.1 12.1  HCT 40.5 39.6  MCV 89.0 92.1  PLT 163 192    Cardiac Enzymes: No results for input(s): CKTOTAL, CKMB, CKMBINDEX, TROPONINI in the last 168 hours.  BNP: Invalid input(s): POCBNP      Impression/Recommendations 1. Aflutter with RVR - new diagnosis for patient, occurred in setting of severe  pneumonia and hypoxia - rate controlled with dlitiazem. Fairly inconsistent oral intake, would continue dilt gtt at this time, convert to oral when taking PO more consistently - CHADS2Vasc score 4, given her advanced dementia anticoag would be of increased risk and lower benefit. Would not initiate at this time. Nursing reports palliative meeting today to establish goals of care, pending results of that meeting and family wishes reconsider anticoag at that time. Fairly recent echo fairly unremakrable, at this time do not see repeat study changing overall management.     Carlyle Dolly, M.D.,

## 2016-04-04 LAB — CULTURE, BLOOD (ROUTINE X 2)
CULTURE: NO GROWTH
CULTURE: NO GROWTH

## 2016-04-05 ENCOUNTER — Telehealth: Payer: Self-pay | Admitting: *Deleted

## 2016-04-05 NOTE — Telephone Encounter (Signed)
Pt was on TCM list admitted for Hypoxia, A-fib, and HCAP was d/c 7/3, and sent to SNF w/hospice services...[name]

## 2016-05-01 DEATH — deceased

## 2016-05-25 ENCOUNTER — Other Ambulatory Visit: Payer: Self-pay

## 2016-12-18 IMAGING — DX DG LUMBAR SPINE 2-3V
3 series · 3 of 3 positions shown · non-contrast
Comparison: MRI of the lumbar spine September 29, 2009

CLINICAL DATA: Low back pain, no mention of acute injury

EXAM:
LUMBAR SPINE - 2-3 VIEW

[l-spine ap]
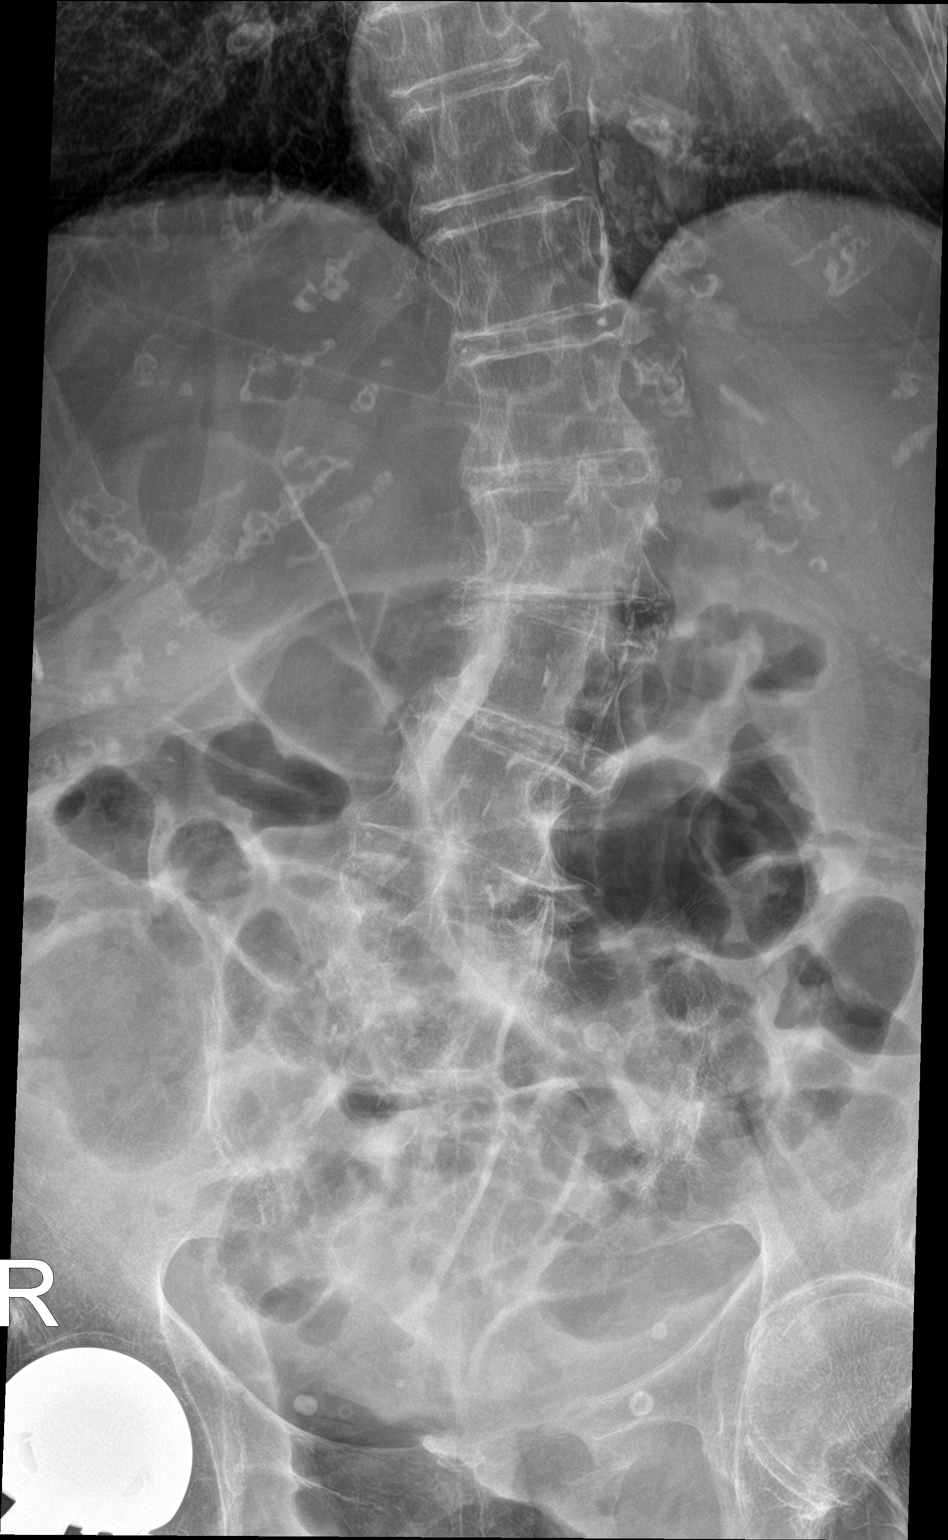

[l-spine lat]
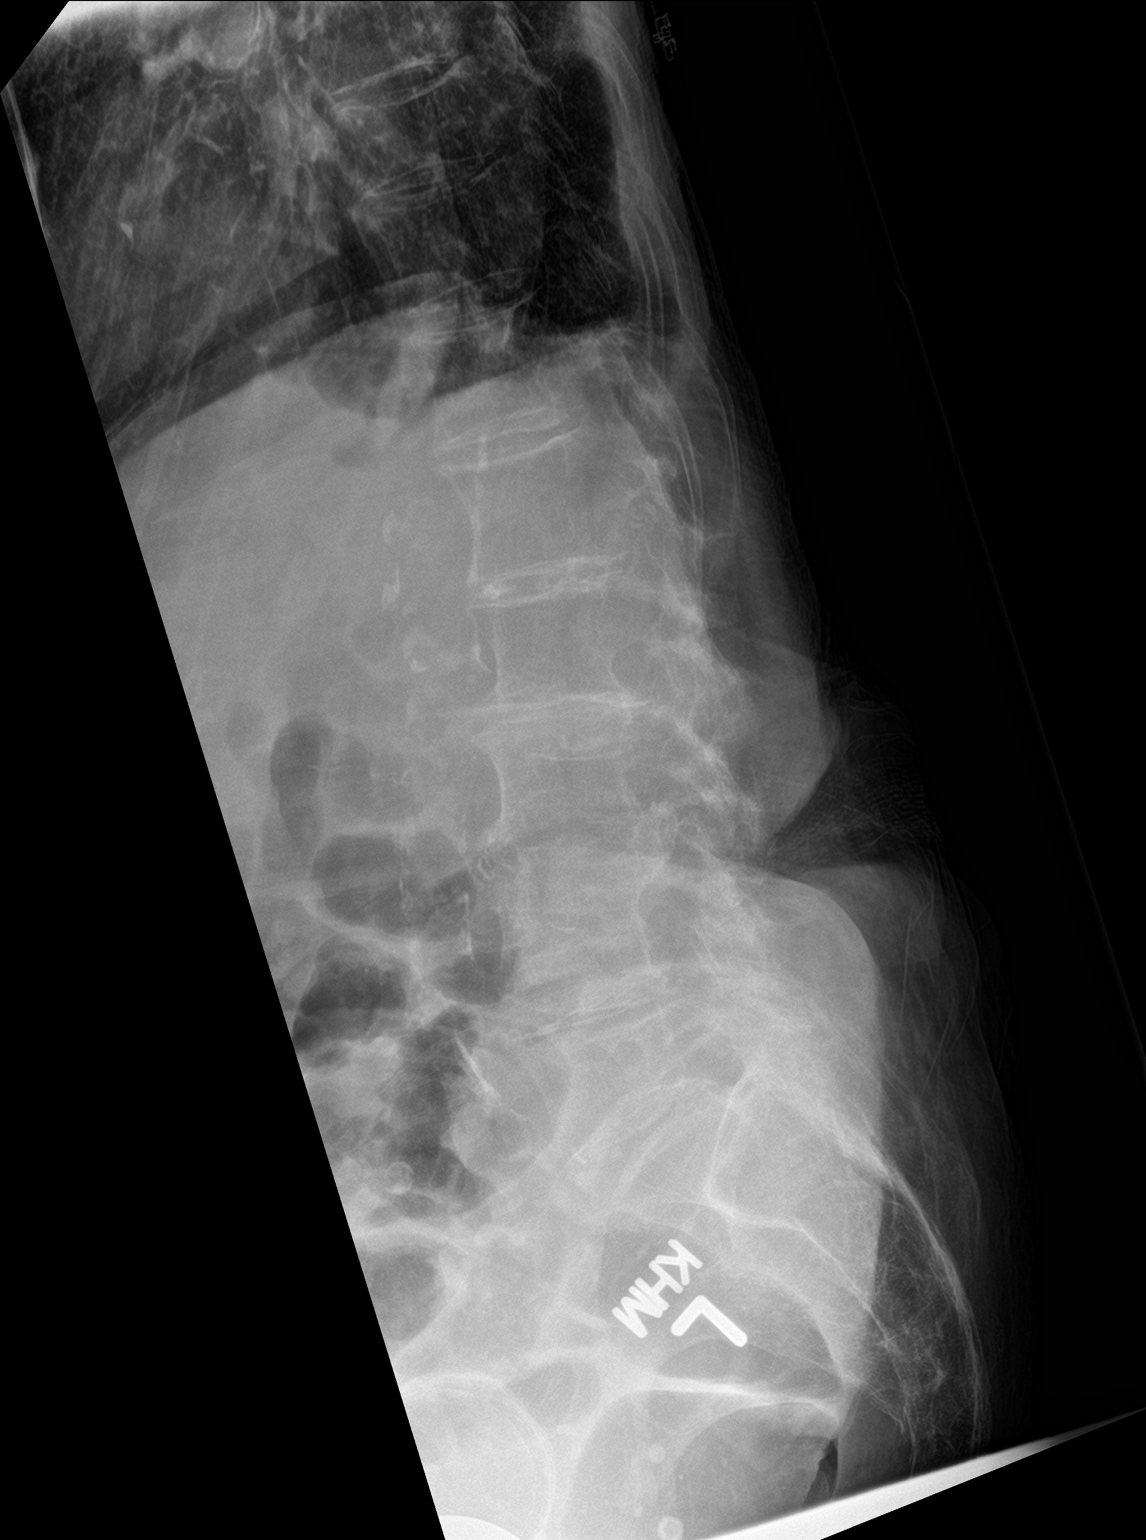

[l-spine spot]
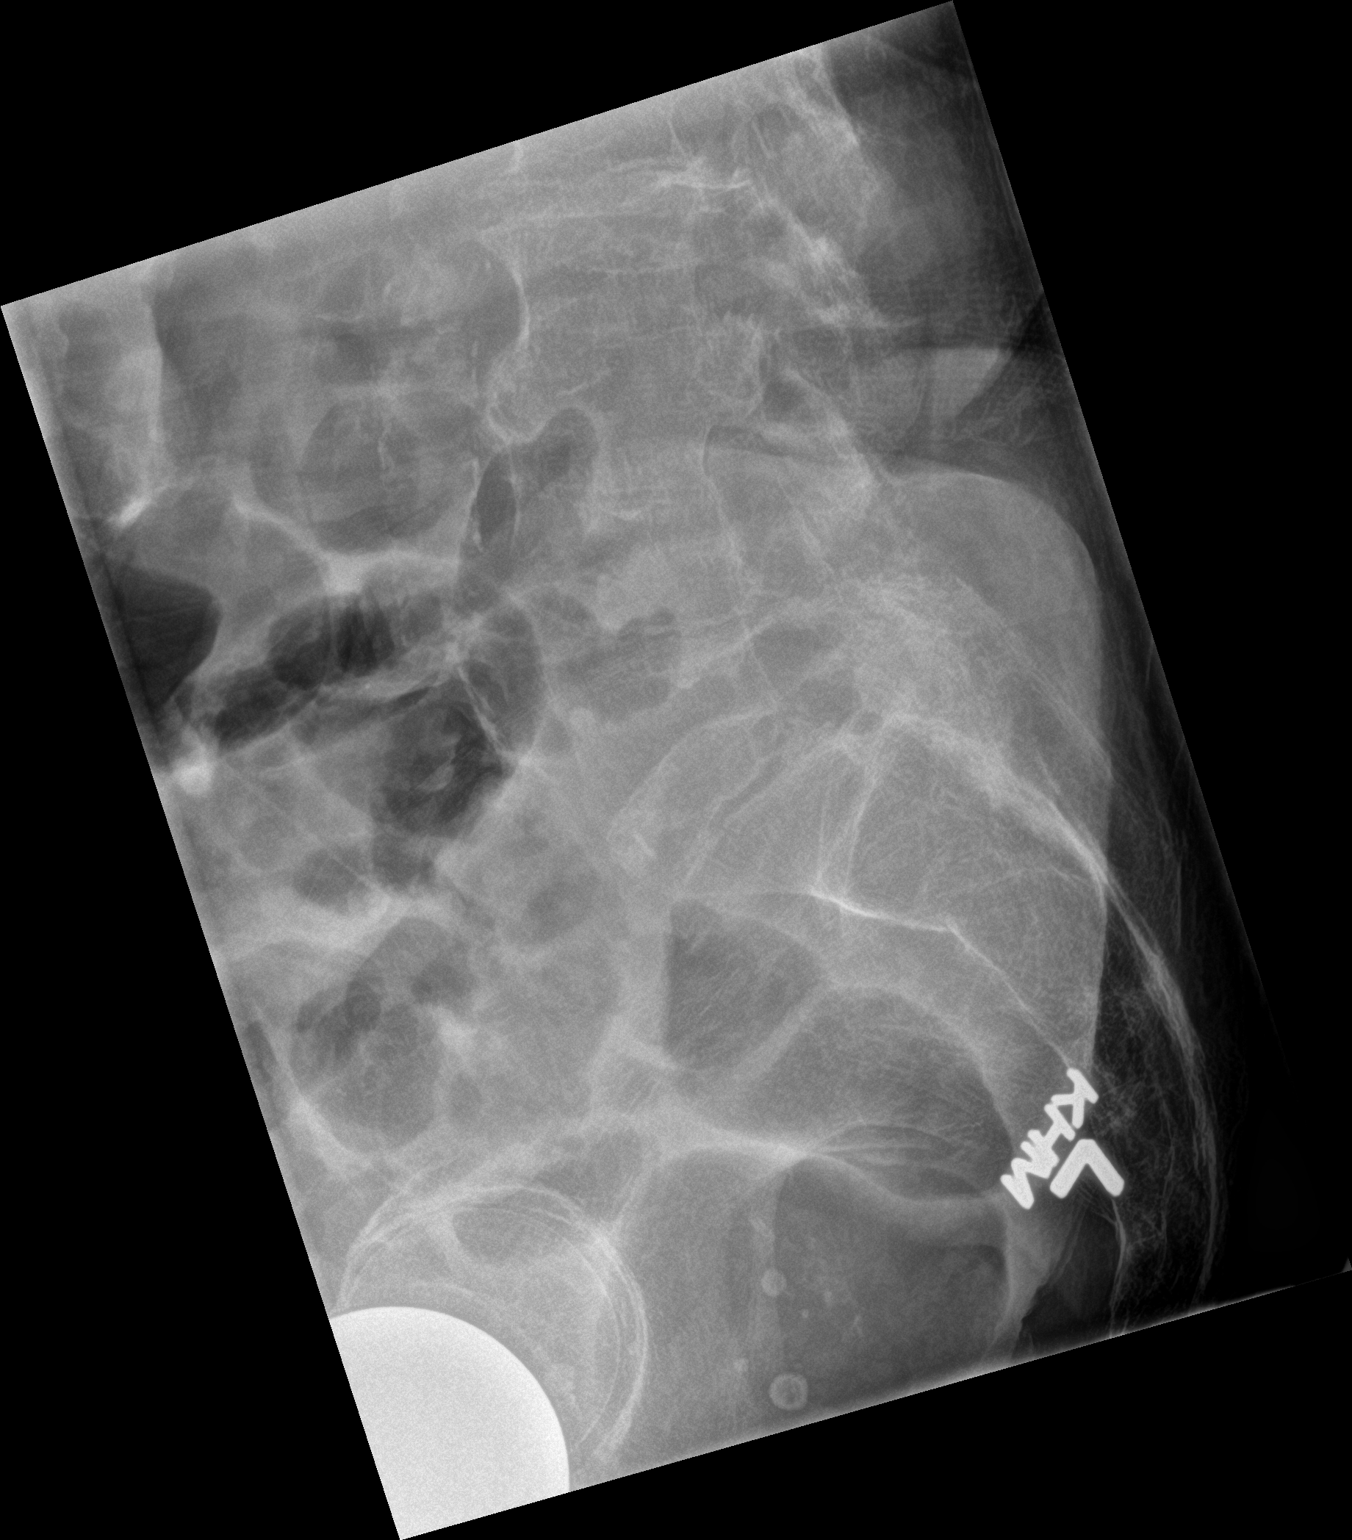

[3 of 3 positions shown; findings below may reference images not displayed]

FINDINGS: There is moderate curvature centered in the mid lumbar spine with
the convexity toward the left. The bones are osteopenic. Where
visualized the pedicles are intact. There is no compression
fracture. There is multilevel degenerative disc space narrowing.
There is facet joint hypertrophy at L4-5 and at L5-S1.
IMPRESSION: There is moderate levocurvature of the mid lumbar spine without
evidence of compression fracture. There is moderate multilevel
degenerative disc disease and facet joint hypertrophy.

## 2017-03-16 IMAGING — DX DG SACRUM/COCCYX 2+V
3 series · 3 of 3 positions shown · non-contrast
Comparison: CT 04/22/2015.

CLINICAL DATA: Pain.  Fall.

EXAM:
SACRUM AND COCCYX - 2+ VIEW

[coccyx ap]
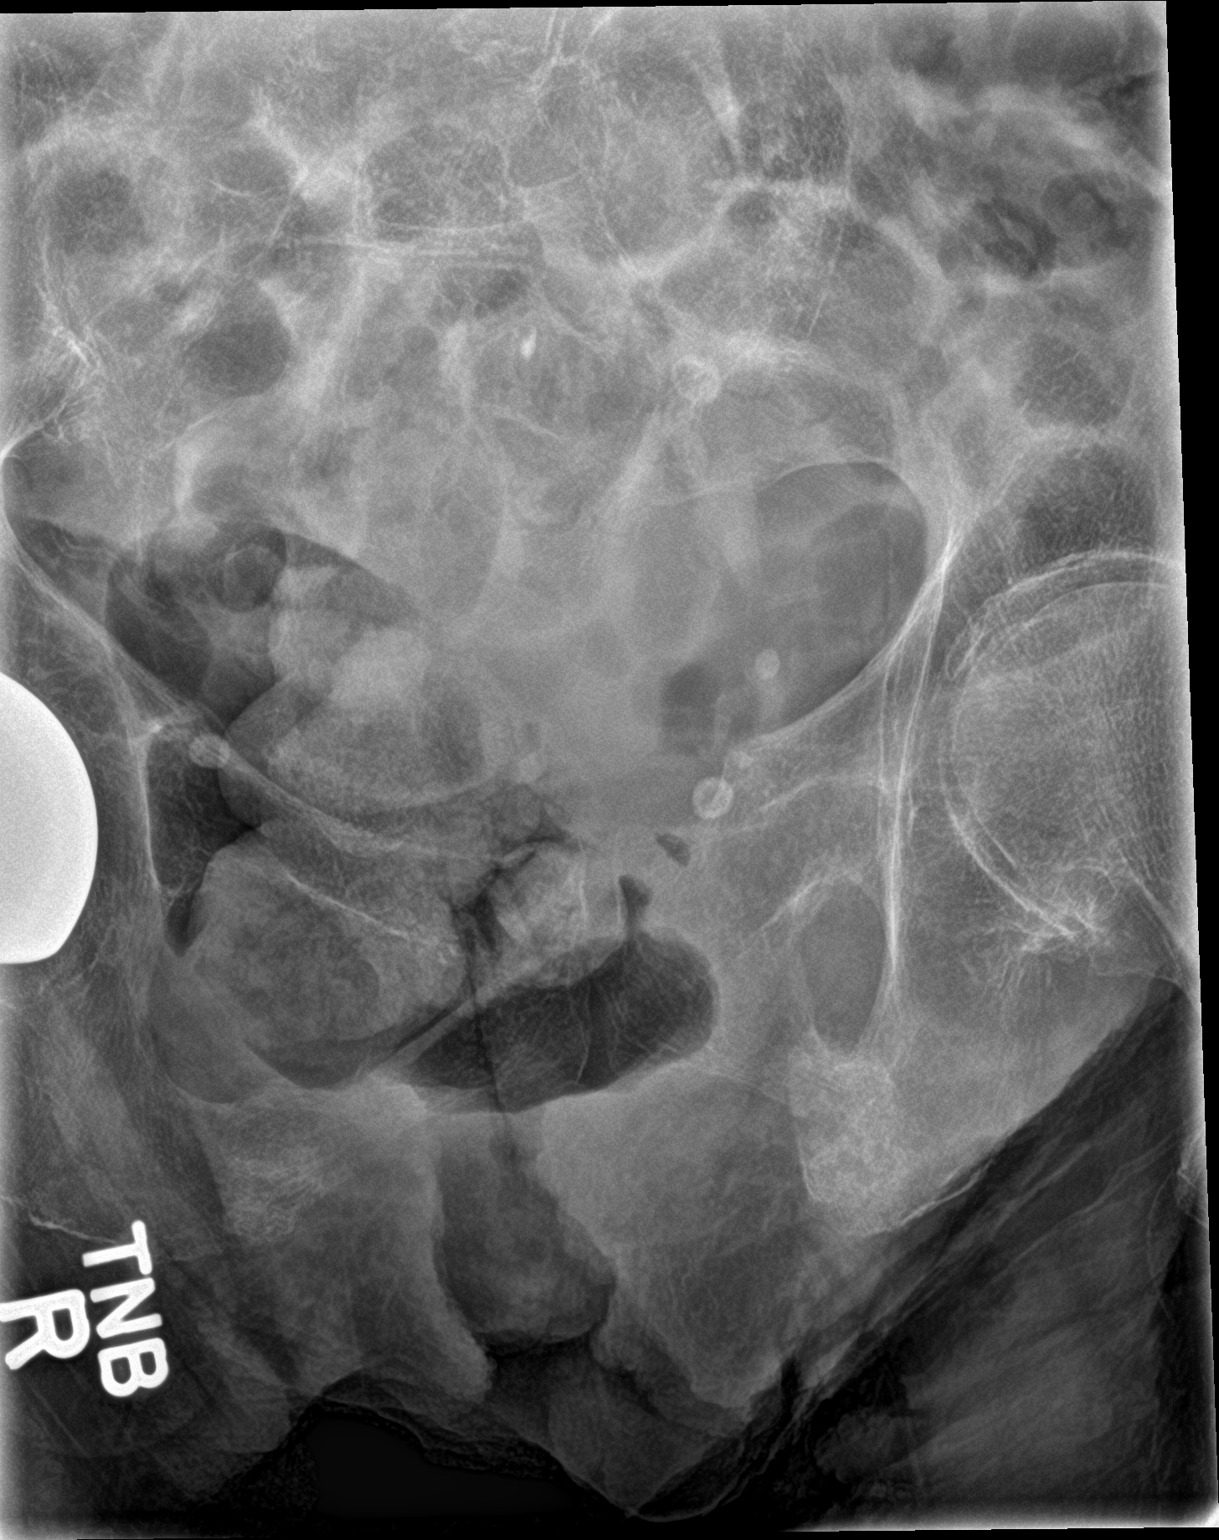

[sacrum ap]
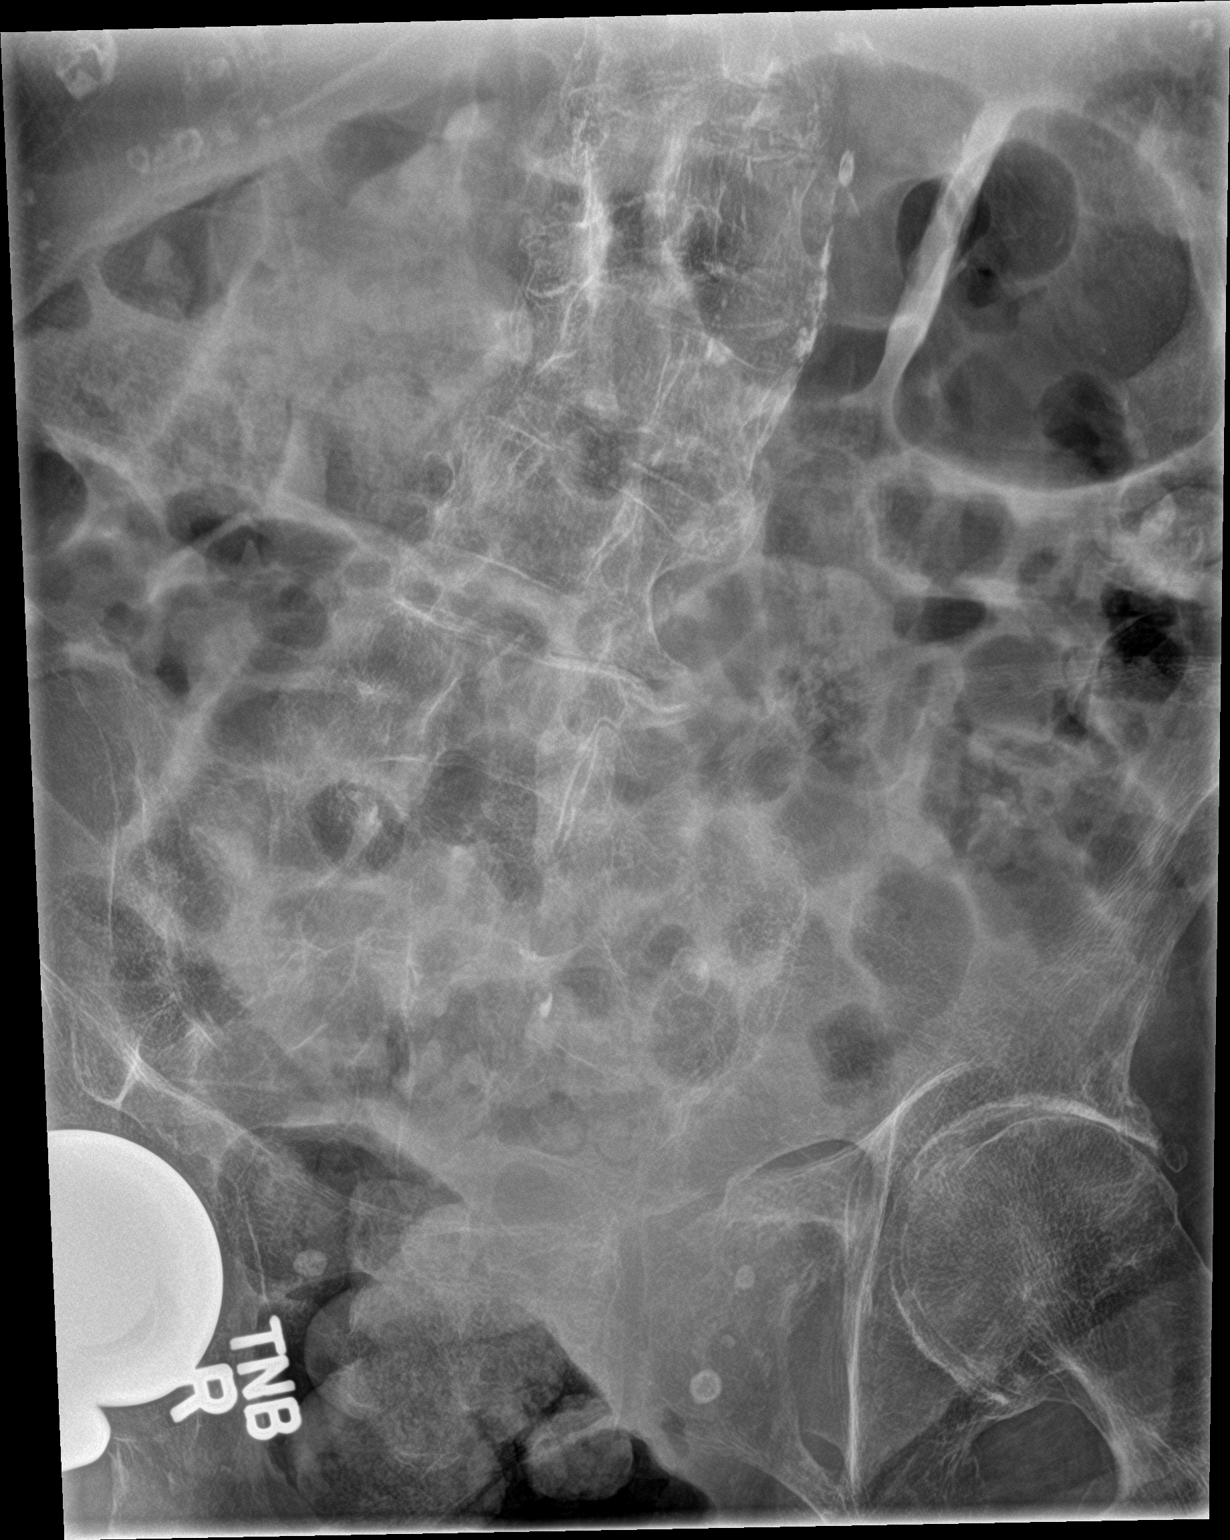

[sacrum lat]
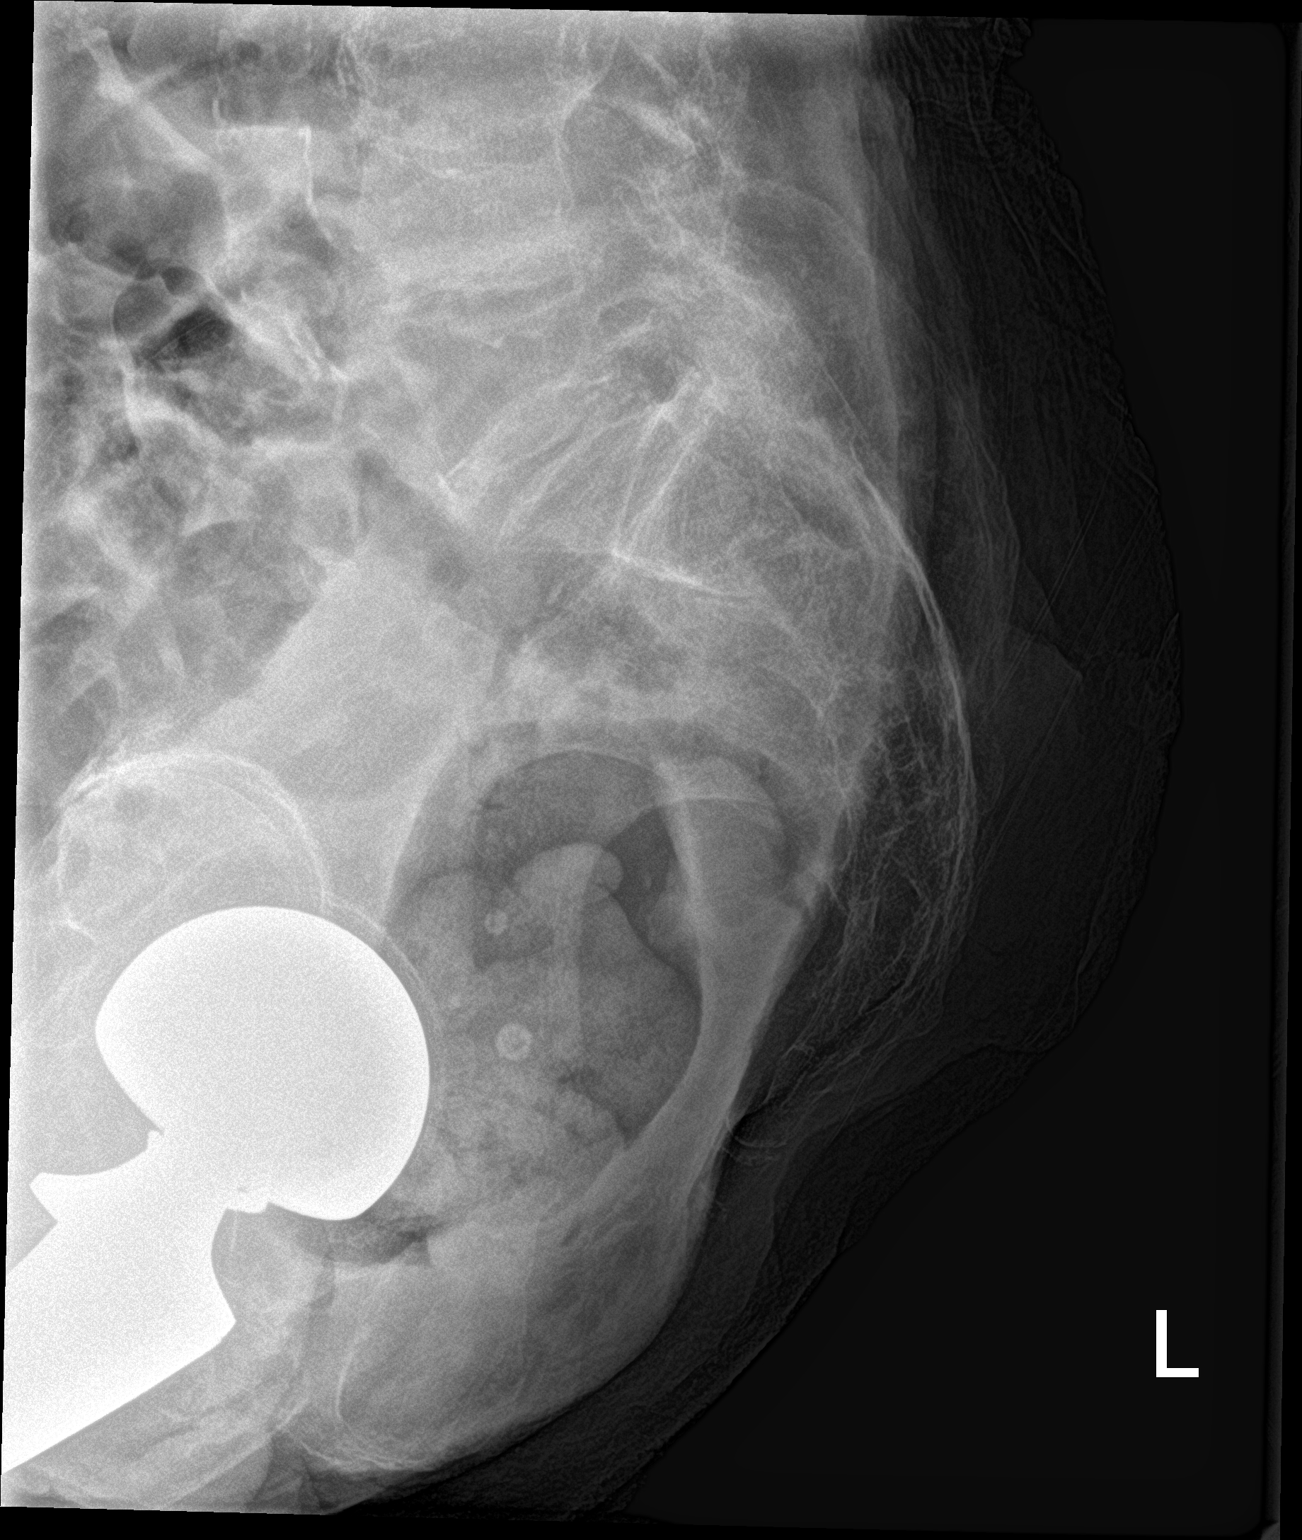

[3 of 3 positions shown; findings below may reference images not displayed]

FINDINGS: Diffuse severe osteopenia. Given the severity of the osteopenia
evaluation for fracture would be extremely difficult. Fractures of
the superior and inferior pubic rami bilaterally cannot be excluded.
Age of the fracture undetermined.
IMPRESSION: Severe osteopenia. The severity of the osteopenia makes evaluation
for fracture extremely difficult. Fractures of the superior and
inferior pubic rami bilaterally are suspected. Age of these
fractures undetermined.

## 2019-01-25 ENCOUNTER — Encounter: Payer: Self-pay | Admitting: Internal Medicine
